# Patient Record
Sex: Male | Born: 1945 | Race: White | Hispanic: No | Marital: Married | Smoking: Never smoker
Health system: Southern US, Community
[De-identification: ages and names within clinical notes are randomized; demographics above are authoritative.]

## PROBLEM LIST (undated history)

## (undated) DIAGNOSIS — M436 Torticollis: Secondary | ICD-10-CM

## (undated) DIAGNOSIS — I82409 Acute embolism and thrombosis of unspecified deep veins of unspecified lower extremity: Secondary | ICD-10-CM

## (undated) DIAGNOSIS — G894 Chronic pain syndrome: Secondary | ICD-10-CM

## (undated) DIAGNOSIS — F32A Depression, unspecified: Secondary | ICD-10-CM

## (undated) DIAGNOSIS — M1990 Unspecified osteoarthritis, unspecified site: Secondary | ICD-10-CM

## (undated) DIAGNOSIS — E785 Hyperlipidemia, unspecified: Secondary | ICD-10-CM

## (undated) DIAGNOSIS — Z974 Presence of external hearing-aid: Secondary | ICD-10-CM

## (undated) DIAGNOSIS — R51 Headache: Secondary | ICD-10-CM

## (undated) DIAGNOSIS — E119 Type 2 diabetes mellitus without complications: Secondary | ICD-10-CM

## (undated) DIAGNOSIS — K219 Gastro-esophageal reflux disease without esophagitis: Secondary | ICD-10-CM

## (undated) DIAGNOSIS — K279 Peptic ulcer, site unspecified, unspecified as acute or chronic, without hemorrhage or perforation: Secondary | ICD-10-CM

## (undated) DIAGNOSIS — F419 Anxiety disorder, unspecified: Secondary | ICD-10-CM

## (undated) DIAGNOSIS — K449 Diaphragmatic hernia without obstruction or gangrene: Secondary | ICD-10-CM

## (undated) DIAGNOSIS — R519 Headache, unspecified: Secondary | ICD-10-CM

## (undated) DIAGNOSIS — N200 Calculus of kidney: Secondary | ICD-10-CM

## (undated) DIAGNOSIS — N183 Chronic kidney disease, stage 3 unspecified: Secondary | ICD-10-CM

## (undated) DIAGNOSIS — G4730 Sleep apnea, unspecified: Secondary | ICD-10-CM

## (undated) DIAGNOSIS — I10 Essential (primary) hypertension: Secondary | ICD-10-CM

## (undated) DIAGNOSIS — F329 Major depressive disorder, single episode, unspecified: Secondary | ICD-10-CM

## (undated) HISTORY — PX: TOTAL KNEE ARTHROPLASTY: SHX125

## (undated) HISTORY — PX: URETERAL STENT PLACEMENT: SHX822

## (undated) HISTORY — DX: Hyperlipidemia, unspecified: E78.5

## (undated) HISTORY — PX: CYSTOSCOPY: SUR368

## (undated) HISTORY — PX: JOINT REPLACEMENT: SHX530

## (undated) HISTORY — PX: SHOULDER OPEN ROTATOR CUFF REPAIR: SHX2407

---

## 1999-03-21 HISTORY — PX: TRIGGER FINGER RELEASE: SHX641

## 2004-01-20 ENCOUNTER — Ambulatory Visit: Payer: Self-pay | Admitting: Physician Assistant

## 2004-02-10 ENCOUNTER — Other Ambulatory Visit: Payer: Self-pay

## 2004-02-16 ENCOUNTER — Ambulatory Visit: Payer: Self-pay | Admitting: Unknown Physician Specialty

## 2005-04-13 ENCOUNTER — Ambulatory Visit: Payer: Self-pay | Admitting: Unknown Physician Specialty

## 2005-05-23 ENCOUNTER — Ambulatory Visit: Payer: Self-pay | Admitting: Unknown Physician Specialty

## 2005-05-26 ENCOUNTER — Other Ambulatory Visit: Payer: Self-pay

## 2005-05-30 ENCOUNTER — Observation Stay: Payer: Self-pay | Admitting: Unknown Physician Specialty

## 2007-02-26 ENCOUNTER — Other Ambulatory Visit: Payer: Self-pay

## 2007-02-26 ENCOUNTER — Ambulatory Visit: Payer: Self-pay | Admitting: Unknown Physician Specialty

## 2007-03-04 ENCOUNTER — Inpatient Hospital Stay: Payer: Self-pay | Admitting: Unknown Physician Specialty

## 2007-03-21 HISTORY — PX: ANTERIOR CERVICAL DECOMP/DISCECTOMY FUSION: SHX1161

## 2008-01-28 ENCOUNTER — Ambulatory Visit: Payer: Self-pay | Admitting: Unknown Physician Specialty

## 2008-02-21 ENCOUNTER — Ambulatory Visit: Payer: Self-pay | Admitting: Unknown Physician Specialty

## 2008-02-26 ENCOUNTER — Ambulatory Visit: Payer: Self-pay | Admitting: Unknown Physician Specialty

## 2008-11-07 ENCOUNTER — Ambulatory Visit: Payer: Self-pay | Admitting: Unknown Physician Specialty

## 2010-01-18 ENCOUNTER — Ambulatory Visit: Payer: Self-pay | Admitting: Cardiology

## 2010-09-30 ENCOUNTER — Ambulatory Visit: Payer: Self-pay | Admitting: Unknown Physician Specialty

## 2010-10-14 ENCOUNTER — Ambulatory Visit: Payer: Self-pay | Admitting: Unknown Physician Specialty

## 2010-10-18 ENCOUNTER — Observation Stay: Payer: Self-pay | Admitting: Unknown Physician Specialty

## 2012-01-22 ENCOUNTER — Ambulatory Visit: Payer: Self-pay | Admitting: Otolaryngology

## 2012-01-22 LAB — CREATININE, SERUM: Creatinine: 1.54 mg/dL — ABNORMAL HIGH (ref 0.60–1.30)

## 2012-04-29 ENCOUNTER — Ambulatory Visit: Payer: Self-pay

## 2013-09-12 DIAGNOSIS — E1129 Type 2 diabetes mellitus with other diabetic kidney complication: Secondary | ICD-10-CM | POA: Insufficient documentation

## 2013-09-12 DIAGNOSIS — E785 Hyperlipidemia, unspecified: Secondary | ICD-10-CM | POA: Insufficient documentation

## 2013-09-12 DIAGNOSIS — G4730 Sleep apnea, unspecified: Secondary | ICD-10-CM | POA: Insufficient documentation

## 2013-09-12 DIAGNOSIS — I10 Essential (primary) hypertension: Secondary | ICD-10-CM | POA: Insufficient documentation

## 2013-09-12 DIAGNOSIS — E782 Mixed hyperlipidemia: Secondary | ICD-10-CM | POA: Insufficient documentation

## 2013-09-12 DIAGNOSIS — G43909 Migraine, unspecified, not intractable, without status migrainosus: Secondary | ICD-10-CM | POA: Insufficient documentation

## 2013-09-23 DIAGNOSIS — I2510 Atherosclerotic heart disease of native coronary artery without angina pectoris: Secondary | ICD-10-CM | POA: Insufficient documentation

## 2013-11-06 ENCOUNTER — Ambulatory Visit (INDEPENDENT_AMBULATORY_CARE_PROVIDER_SITE_OTHER): Payer: Medicare Other

## 2013-11-06 ENCOUNTER — Ambulatory Visit (INDEPENDENT_AMBULATORY_CARE_PROVIDER_SITE_OTHER): Payer: Medicare Other | Admitting: Podiatry

## 2013-11-06 ENCOUNTER — Encounter: Payer: Self-pay | Admitting: Podiatry

## 2013-11-06 VITALS — BP 123/74 | HR 51 | Resp 16 | Ht 72.0 in | Wt 235.0 lb

## 2013-11-06 DIAGNOSIS — B351 Tinea unguium: Secondary | ICD-10-CM

## 2013-11-06 DIAGNOSIS — M79676 Pain in unspecified toe(s): Secondary | ICD-10-CM

## 2013-11-06 DIAGNOSIS — E1149 Type 2 diabetes mellitus with other diabetic neurological complication: Secondary | ICD-10-CM

## 2013-11-06 DIAGNOSIS — M79609 Pain in unspecified limb: Secondary | ICD-10-CM

## 2013-11-06 DIAGNOSIS — M722 Plantar fascial fibromatosis: Secondary | ICD-10-CM

## 2013-11-06 MED ORDER — TRIAMCINOLONE ACETONIDE 10 MG/ML IJ SUSP
10.0000 mg | Freq: Once | INTRAMUSCULAR | Status: AC
  Administered 2013-11-06: 10 mg

## 2013-11-06 NOTE — Progress Notes (Signed)
Patient ID: Xavier Hebert, male   DOB: Feb 21, 1946, 68 y.o.   MRN: [id number]  68 year old male presents the office today with complaints of left heel pain as well as painful elongated nails. For the heel pain, he states that previously he had been treated for plantar fasciitis and a heel spur. He previously had a steroid injection into the area and states that the injection gave him relief. He  states that this pain in his heel today is similar to what it felt like before. Pain is worse after a period of rest and is intermittent. No recent treatment. No history of trauma. States his sugar runs from 94-110.  Objective: AAO x3, NAD. DP/PT pulses palpable 2/4 b/l. CRT < 3 sec. Mild decrease in protective sensation with a Semmes Weinstein monofilament. Nail exam reveals hypertrophic, elongated, brittle, yellow discoloration, pain x10. No hyperkerotic lesions. No open lesions. Pain on palpation to the left plantar medial tubercle of the calcaneus at the insertion of the plantar fascia. No pain along the course of the plantar fascia into the arch of the foot. Plantar fascia is intact. No pain with lateral compression of the calcaneus. Mild equinus.  Assessment: 68 year old male with left plantar fasciitis, symptomatic onychomycosis  Plan: At this visit x-rays were obtained which did not reveal any acute fractures. There is large calcaneal plantar spurs. The x-ray report for full details. Discussed both conservative and surgical intervention with the patient in detail including alternatives, risks, complications. At this time the patient is requesting a steroid injection into the left heel to help alleviate his symptoms. Under sterile skin preparation total of 2.5 cc of a mixture of Kenalog 10, 0.5% Marcaine plain, 2% lidocaine plain was infiltrated around the insertion of the plantar fascia the plantar medial aspect of the calcaneus. She tolerated the procedure well. Discussed stretching exercises. Limit activity  to the left foot and no strenuous activity to allow the inflammation to resolve.   Nail sharply debrided T70 without complications.  Followup in one month or sooner if any problems are to arise.

## 2013-11-06 NOTE — Patient Instructions (Signed)
Plantar Fasciitis (Heel Spur Syndrome) with Rehab The plantar fascia is a fibrous, ligament-like, soft-tissue structure that spans the bottom of the foot. Plantar fasciitis is a condition that causes pain in the foot due to inflammation of the tissue. SYMPTOMS   Pain and tenderness on the underneath side of the foot.  Pain that worsens with standing or walking. CAUSES  Plantar fasciitis is caused by irritation and injury to the plantar fascia on the underneath side of the foot. Common mechanisms of injury include:  Direct trauma to bottom of the foot.  Damage to a small nerve that runs under the foot where the main fascia attaches to the heel bone.  Stress placed on the plantar fascia due to bone spurs. RISK INCREASES WITH:   Activities that place stress on the plantar fascia (running, jumping, pivoting, or cutting).  Poor strength and flexibility.  Improperly fitted shoes.  Tight calf muscles.  Flat feet.  Failure to warm-up properly before activity.  Obesity. PREVENTION  Warm up and stretch properly before activity.  Allow for adequate recovery between workouts.  Maintain physical fitness:  Strength, flexibility, and endurance.  Cardiovascular fitness.  Maintain a health body weight.  Avoid stress on the plantar fascia.  Wear properly fitted shoes, including arch supports for individuals who have flat feet. PROGNOSIS  If treated properly, then the symptoms of plantar fasciitis usually resolve without surgery. However, occasionally surgery is necessary. RELATED COMPLICATIONS   Recurrent symptoms that may result in a chronic condition.  Problems of the lower back that are caused by compensating for the injury, such as limping.  Pain or weakness of the foot during push-off following surgery.  Chronic inflammation, scarring, and partial or complete fascia tear, occurring more often from repeated injections. TREATMENT  Treatment initially involves the use of  ice and medication to help reduce pain and inflammation. The use of strengthening and stretching exercises may help reduce pain with activity, especially stretches of the Achilles tendon. These exercises may be performed at home or with a therapist. Your caregiver may recommend that you use heel cups of arch supports to help reduce stress on the plantar fascia. Occasionally, corticosteroid injections are given to reduce inflammation. If symptoms persist for greater than 6 months despite non-surgical (conservative), then surgery may be recommended.  MEDICATION   If pain medication is necessary, then nonsteroidal anti-inflammatory medications, such as aspirin and ibuprofen, or other minor pain relievers, such as acetaminophen, are often recommended.  Do not take pain medication within 7 days before surgery.  Prescription pain relievers may be given if deemed necessary by your caregiver. Use only as directed and only as much as you need.  Corticosteroid injections may be given by your caregiver. These injections should be reserved for the most serious cases, because they may only be given a certain number of times. HEAT AND COLD  Cold treatment (icing) relieves pain and reduces inflammation. Cold treatment should be applied for 10 to 15 minutes every 2 to 3 hours for inflammation and pain and immediately after any activity that aggravates your symptoms. Use ice packs or massage the area with a piece of ice (ice massage).  Heat treatment may be used prior to performing the stretching and strengthening activities prescribed by your caregiver, physical therapist, or athletic trainer. Use a heat pack or soak the injury in warm water. SEEK IMMEDIATE MEDICAL CARE IF:  Treatment seems to offer no benefit, or the condition worsens.  Any medications produce adverse side effects. EXERCISES RANGE   OF MOTION (ROM) AND STRETCHING EXERCISES - Plantar Fasciitis (Heel Spur Syndrome) These exercises may help you  when beginning to rehabilitate your injury. Your symptoms may resolve with or without further involvement from your physician, physical therapist or athletic trainer. While completing these exercises, remember:   Restoring tissue flexibility helps normal motion to return to the joints. This allows healthier, less painful movement and activity.  An effective stretch should be held for at least 30 seconds.  A stretch should never be painful. You should only feel a gentle lengthening or release in the stretched tissue. RANGE OF MOTION - Toe Extension, Flexion  Sit with your right / left leg crossed over your opposite knee.  Grasp your toes and gently pull them back toward the top of your foot. You should feel a stretch on the bottom of your toes and/or foot.  Hold this stretch for __________ seconds.  Now, gently pull your toes toward the bottom of your foot. You should feel a stretch on the top of your toes and or foot.  Hold this stretch for __________ seconds. Repeat __________ times. Complete this stretch __________ times per day.  RANGE OF MOTION - Ankle Dorsiflexion, Active Assisted  Remove shoes and sit on a chair that is preferably not on a carpeted surface.  Place right / left foot under knee. Extend your opposite leg for support.  Keeping your heel down, slide your right / left foot back toward the chair until you feel a stretch at your ankle or calf. If you do not feel a stretch, slide your bottom forward to the edge of the chair, while still keeping your heel down.  Hold this stretch for __________ seconds. Repeat __________ times. Complete this stretch __________ times per day.  STRETCH - Gastroc, Standing  Place hands on wall.  Extend right / left leg, keeping the front knee somewhat bent.  Slightly point your toes inward on your back foot.  Keeping your right / left heel on the floor and your knee straight, shift your weight toward the wall, not allowing your back to  arch.  You should feel a gentle stretch in the right / left calf. Hold this position for __________ seconds. Repeat __________ times. Complete this stretch __________ times per day. STRETCH - Soleus, Standing  Place hands on wall.  Extend right / left leg, keeping the other knee somewhat bent.  Slightly point your toes inward on your back foot.  Keep your right / left heel on the floor, bend your back knee, and slightly shift your weight over the back leg so that you feel a gentle stretch deep in your back calf.  Hold this position for __________ seconds. Repeat __________ times. Complete this stretch __________ times per day. STRETCH - Gastrocsoleus, Standing  Note: This exercise can place a lot of stress on your foot and ankle. Please complete this exercise only if specifically instructed by your caregiver.   Place the ball of your right / left foot on a step, keeping your other foot firmly on the same step.  Hold on to the wall or a rail for balance.  Slowly lift your other foot, allowing your body weight to press your heel down over the edge of the step.  You should feel a stretch in your right / left calf.  Hold this position for __________ seconds.  Repeat this exercise with a slight bend in your right / left knee. Repeat __________ times. Complete this stretch __________ times per day.    STRENGTHENING EXERCISES - Plantar Fasciitis (Heel Spur Syndrome)  These exercises may help you when beginning to rehabilitate your injury. They may resolve your symptoms with or without further involvement from your physician, physical therapist or athletic trainer. While completing these exercises, remember:   Muscles can gain both the endurance and the strength needed for everyday activities through controlled exercises.  Complete these exercises as instructed by your physician, physical therapist or athletic trainer. Progress the resistance and repetitions only as guided. STRENGTH -  Towel Curls  Sit in a chair positioned on a non-carpeted surface.  Place your foot on a towel, keeping your heel on the floor.  Pull the towel toward your heel by only curling your toes. Keep your heel on the floor.  If instructed by your physician, physical therapist or athletic trainer, add ____________________ at the end of the towel. Repeat __________ times. Complete this exercise __________ times per day. STRENGTH - Ankle Inversion  Secure one end of a rubber exercise band/tubing to a fixed object (table, pole). Loop the other end around your foot just before your toes.  Place your fists between your knees. This will focus your strengthening at your ankle.  Slowly, pull your big toe up and in, making sure the band/tubing is positioned to resist the entire motion.  Hold this position for __________ seconds.  Have your muscles resist the band/tubing as it slowly pulls your foot back to the starting position. Repeat __________ times. Complete this exercises __________ times per day.  Document Released: 03/06/2005 Document Revised: 05/29/2011 Document Reviewed: 06/18/2008 [employer] Patient Information 2015 [employer]. This information is not intended to replace advice given to you by your health care provider. Make sure you discuss any questions you have with your health care provider.  

## 2013-12-04 ENCOUNTER — Ambulatory Visit (INDEPENDENT_AMBULATORY_CARE_PROVIDER_SITE_OTHER): Payer: Medicare Other

## 2013-12-04 ENCOUNTER — Ambulatory Visit (INDEPENDENT_AMBULATORY_CARE_PROVIDER_SITE_OTHER): Payer: Medicare Other | Admitting: Podiatry

## 2013-12-04 VITALS — BP 117/67 | HR 72 | Resp 16

## 2013-12-04 DIAGNOSIS — M722 Plantar fascial fibromatosis: Secondary | ICD-10-CM

## 2013-12-04 DIAGNOSIS — M19079 Primary osteoarthritis, unspecified ankle and foot: Secondary | ICD-10-CM

## 2013-12-04 DIAGNOSIS — M779 Enthesopathy, unspecified: Secondary | ICD-10-CM

## 2013-12-04 NOTE — Patient Instructions (Signed)
Stirrup Ankle Brace Stirrup ankle braces give support and help stabilize the ankle joint. They are rigid pieces of plastic or fiberglass that go up both sides of the lower leg with the bottom of the stirrup fitting comfortably under the bottom of the instep of the foot. It can be held on with Velcro straps or an elastic wrap. Stirrup ankle braces are used to support the ankle following mild or moderate sprains or strains, or fractures after cast removal.  They can be easily removed or adjusted if there is swelling. The rigid brace shells are designed to fit the ankle comfortably and provide the needed medial/lateral stabilization. This brace can be easily worn with most athletic shoes. The brace liner is usually made of a soft, comfortable gel-like material. This gel fits the ankle well without causing uncomfortable pressure points.  IMPORTANCE OF ANKLE BRACES:  The use of ankle bracing is effective in the prevention of ankle sprains.  In athletes, the use of ankle bracing will offer protection and prevent further sprains.  Research shows that a complete rehabilitation program needs to be included with external bracing. This includes range of motion and ankle strengthening exercises. Your caregivers will instruct you in this. If you were given the brace today for a new injury, use the following home care instructions as a guide. HOME CARE INSTRUCTIONS   Apply ice to the sore area for 15-20 minutes, 03-04 times per day while awake for the first 2 days. Put the ice in a plastic bag and place a towel between the bag of ice and your skin. Never place the ice pack directly on your skin. Be especially careful using ice on an elbow or knee or other bony area, such as your ankle, because icing for too long may damage the nerves which are close to the surface.  Keep your leg elevated when possible to lessen swelling.  Wear your splint until you are seen for a follow-up examination. Do not put weight on it.  Do not get it wet. You may take it off to take a shower or bath.  For Activity: Use crutches with non-weight bearing for 1 week. Then, you may walk on your ankle as instructed. Start gradually with weight bearing on the affected ankle.  Continue to use crutches or a cane until you can stand on your ankle without causing pain.  Wiggle your toes in the splint several times per day if you are able.  The splint is too tight if you have numbness, tingling, or if your foot becomes cold and blue. Adjust the straps or elastic bandage to make it comfortable.  Only take over-the-counter or prescription medicines for pain, discomfort, or fever as directed by your caregiver. SEEK IMMEDIATE MEDICAL CARE IF:   You have increased bruising, swelling or pain.  Your toes are blue or cold and loosening the brace or wrap does not help.  Your pain is not relieved with medicine. MAKE SURE YOU:   Understand these instructions.  Will watch your condition.  Will get help right away if you are not doing well or get worse. Document Released: 01/05/2004 Document Revised: 05/29/2011 Document Reviewed: 06/08/2008 [facility] Patient Information 2015 [place], [place]. This information is not intended to replace advice given to you by your health care provider. Make sure you discuss any questions you have with your health care provider.

## 2013-12-05 ENCOUNTER — Encounter: Payer: Self-pay | Admitting: Podiatry

## 2013-12-05 NOTE — Progress Notes (Signed)
Patient ID: Xavier Hebert, male   DOB: Nov 27, 1945, 68 y.o.   MRN: [id number]  Subjective: Xavier Hebert, 68 year old male, returns to the office today for follow up evaluation of left plantar fasciitis. States that since his last appointment, he has not had any pain to the left heel. He continues with stretching exercises. While he is here he is asking to look at the right ankle. 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 He has intermittent, dull pain to the right ankle. Denies any recent injury/trauma. This has been ongoing for greater than one year. No other complaints at this time.   Objective: AAO x3, NAD DP/PT pulses palpable b/l. CRT < 3sec.  Decreased protective sensation with Derrel Nip monofilament. Left foot: No tenderness to palpation of the plantar medial tubercle of the calcaneus around insertion the plantar fascia. No pain with lateral compression of the calcaneus or along the posterior aspect. The fascia intact. Right ankle: Mild tenderness over the posterior lateral aspect of the fibula. No pinpoint bony tenderness and no pain the vibratory sensation. No pain along the medial malleolus or along the syndesmosis. No proximal leg pain. Range of motion intact to the ankle and pain-free. MMT 5/5, ROM WNL Her leg pain, swelling, warmth.  Assessment: Xavier Hebert, 68 year old male, with resolved left plantar fasciitis; right ankle arthritis  Plan: -X-rays were obtained and reviewed of the right ankle with the patient. There is diffuse chronic changes to the fibula consistent with arthritic changes. -Alternatives, risks and complications of both conservative and surgical treatment were discussed at length with the patient. -For the right ankle dispensed an ankle stabilizer. If pain continues we'll consider MRI. -Continue stretching exercises and icing the left foot. -Follow up in 1 month, or sooner if any  problems arise or if there are any changes in symptoms, questions/concerns.

## 2014-01-01 ENCOUNTER — Ambulatory Visit (INDEPENDENT_AMBULATORY_CARE_PROVIDER_SITE_OTHER): Payer: Medicare Other | Admitting: Podiatry

## 2014-01-01 ENCOUNTER — Encounter: Payer: Self-pay | Admitting: Podiatry

## 2014-01-01 VITALS — BP 111/60 | HR 54 | Resp 16

## 2014-01-01 DIAGNOSIS — E1140 Type 2 diabetes mellitus with diabetic neuropathy, unspecified: Secondary | ICD-10-CM

## 2014-01-01 DIAGNOSIS — B351 Tinea unguium: Secondary | ICD-10-CM

## 2014-01-01 DIAGNOSIS — M129 Arthropathy, unspecified: Secondary | ICD-10-CM

## 2014-01-01 DIAGNOSIS — E1149 Type 2 diabetes mellitus with other diabetic neurological complication: Secondary | ICD-10-CM

## 2014-01-01 DIAGNOSIS — M722 Plantar fascial fibromatosis: Secondary | ICD-10-CM

## 2014-01-01 DIAGNOSIS — M19079 Primary osteoarthritis, unspecified ankle and foot: Secondary | ICD-10-CM

## 2014-01-01 NOTE — Progress Notes (Signed)
Patient ID: Xavier Hebert, male   DOB: [birth year], 68 y.o.   MRN: [id number]  Subjective: Xavier Hebert returns the office they for followup evaluation of left foot plantar fasciitis as well as right ankle arthritis. He states that he has no pain on the left heel. He states he gets intermittent pain to the right ankle. He was wearing an ankle brace for period time and has since has discontinued she's had a flareup of her sciatica and low back pain. He's had no recent pain to his right ankle without the brace. Patient also asking if his fourth and fifth digit nails on bilateral feet can be debrided as they have a sharp edge. Denies any specific injury or trauma to the area. No acute changes since last appointment. No other complaints at this time.  Objective: AAO x3, NAD DP/PT pulses palpable bilaterally, CRT less than 3 seconds Decreased protective sensation with Simms Weinstein monofilament On the left foot there is no tenderness to palpation on the plantar medial tubercle of the calcaneus at the insertion the plantar fascia. No pain with lateral compression of the calcaneus or along the posterior aspect. On the right lower extremity there is no tenderness to palpation over the lateral ankle ligaments, fibula, tibia for syndesmotic space. No pain with range of motion. MMT 5/5, ROM WNL No open lesions or pre-ulcerative lesions. No calf pain, swelling, warmth. Bilateral fourth and fifth digit nails elongated, discolored, brittle. Remaining nails also hypertrophic, dystrophic, brittle, yellow-brown discoloration. No swelling erythema or drainage from the nails.  Assessment: 68 year old male followup evaluation of left foot plantar fasciitis right ankle pain. Elongated fourth and fifth digit nails bilaterally.  Plan: -Conservative versus surgical treatment discussed including alternatives, risks, complications. -At this time the patient's left heel and right ankles a symptomatically. Continue stretching  exercises. Discussed that if he has symptoms with right ankle he can wear the brace intermittently to help. -Fourth and fifth digit nails bilaterally sharply debrided without complications to patient comfort. -Followup on a regular schedule for diabetic risk assessment. Followup in 2 months. In the meantime call the office with any questions, concerns, change in symptoms.

## 2014-03-26 ENCOUNTER — Ambulatory Visit (INDEPENDENT_AMBULATORY_CARE_PROVIDER_SITE_OTHER): Payer: Medicare Other | Admitting: Podiatry

## 2014-03-26 DIAGNOSIS — L97511 Non-pressure chronic ulcer of other part of right foot limited to breakdown of skin: Secondary | ICD-10-CM

## 2014-03-26 DIAGNOSIS — M79676 Pain in unspecified toe(s): Secondary | ICD-10-CM

## 2014-03-26 DIAGNOSIS — B351 Tinea unguium: Secondary | ICD-10-CM

## 2014-03-26 NOTE — Patient Instructions (Signed)

## 2014-03-28 NOTE — Progress Notes (Signed)
Patient ID: Xavier Hebert, male   DOB: 01-24-1946, 69 y.o.   MRN: [id number]  Subjective: 69 year old male presents to the office for painful, elongated, thick toenails. He states the nails are painful particularly with shoegear and pressure. He denies any redness or drainage from around the nail sites. He states he currently does not have any pain to his right ankle or left heel. No other areas of tenderness. Denies any recent injury trauma to bilateral lower extremity is. Denies any systemic complaints as fevers, chills, nausea, vomiting. No other complaints at this time in no acute changes since last appointment.  Objective: AAO 3, NAD DP/PT pulses palpable, CRT less than 3 seconds Protective sensation decreased with Simms Weinstein monofilament, decreased vibratory sensation. Achilles tendon reflex intact. Nails hypertrophic, dystrophic, elongated, brittle, discolored 10. No surrounding erythema or drainage from around the nail sites. Upon debridement of the right third digit toenail the nail split and upon debridement the nail is loose. The nail was debrided of all loose tissue and there was found to be a superficial underlying ulceration underlying the nail. There was a small amount of serous drainage. The wound was granular nature and superficial. There is no sign erythema, ascending cellulitis. No tenderness to palpation overlying the area. No areas of fluctuance or crepitus.  No areas of pinpoint bony tenderness or pain with vibratory sensation to bilateral lower extremities. There is no overlying edema, erythema, increase in warmth bilaterally. MMT 5/5, ROM WNL No other open lesions or pre-ulcerative lesions identified.  No pain with calf compression, swelling, warmth, erythema  Assessment: 69 year old male with pneumatic onychomycosis, superficial ulceration right third digit nail bed.  Plan : -Treatment options were discussed with the patient including alternatives, risks,  complications.  -Nail sharply debrided 10 without complications. Upon debridement of the right third nail to the underlying ulceration the nail was debrided of all loose tissue. Wound was cleaned and antibiotic ointment and a Band-Aid was applied to the area. Recommended the patient to continue daily dressing changes and monitor the area for any clinical signs or symptoms of infection. If any are to occur directed to the emergency room or call the office.  -Follow-up in 2 weeks, or sooner should any problems arise. Directed to call sooner if there is any increase or change in symptoms.Call with any questions/concerns/change in symptoms.

## 2014-04-08 ENCOUNTER — Ambulatory Visit: Payer: Self-pay | Admitting: Unknown Physician Specialty

## 2014-04-09 ENCOUNTER — Ambulatory Visit: Payer: Medicare Other | Admitting: Podiatry

## 2014-06-10 HISTORY — PX: SHOULDER ARTHROSCOPY W/ ROTATOR CUFF REPAIR: SHX2400

## 2014-09-30 ENCOUNTER — Other Ambulatory Visit: Payer: Self-pay | Admitting: Unknown Physician Specialty

## 2014-09-30 DIAGNOSIS — M25511 Pain in right shoulder: Secondary | ICD-10-CM

## 2014-10-05 ENCOUNTER — Ambulatory Visit
Admission: RE | Admit: 2014-10-05 | Discharge: 2014-10-05 | Disposition: A | Discharge status: Home / Self Care | Payer: Medicare Other | Source: Ambulatory Visit | Attending: Unknown Physician Specialty | Admitting: Unknown Physician Specialty

## 2014-10-05 DIAGNOSIS — M62511 Muscle wasting and atrophy, not elsewhere classified, right shoulder: Secondary | ICD-10-CM | POA: Diagnosis not present

## 2014-10-05 DIAGNOSIS — S46811A Strain of other muscles, fascia and tendons at shoulder and upper arm level, right arm, initial encounter: Secondary | ICD-10-CM | POA: Diagnosis not present

## 2014-10-05 DIAGNOSIS — Z9889 Other specified postprocedural states: Secondary | ICD-10-CM | POA: Diagnosis not present

## 2014-10-05 DIAGNOSIS — M6289 Other specified disorders of muscle: Secondary | ICD-10-CM | POA: Diagnosis not present

## 2014-10-05 DIAGNOSIS — M25511 Pain in right shoulder: Secondary | ICD-10-CM | POA: Diagnosis present

## 2014-11-10 ENCOUNTER — Other Ambulatory Visit (HOSPITAL_COMMUNITY): Payer: Self-pay | Admitting: Orthopedic Surgery

## 2014-11-12 ENCOUNTER — Other Ambulatory Visit: Payer: Self-pay | Admitting: Orthopedic Surgery

## 2014-11-12 DIAGNOSIS — M25511 Pain in right shoulder: Secondary | ICD-10-CM

## 2014-11-18 ENCOUNTER — Ambulatory Visit
Admission: RE | Admit: 2014-11-18 | Discharge: 2014-11-18 | Disposition: A | Discharge status: Home / Self Care | Payer: Medicare Other | Source: Ambulatory Visit | Attending: Orthopedic Surgery | Admitting: Orthopedic Surgery

## 2014-11-18 DIAGNOSIS — M25511 Pain in right shoulder: Secondary | ICD-10-CM

## 2014-11-24 ENCOUNTER — Encounter (HOSPITAL_COMMUNITY)
Admission: RE | Admit: 2014-11-24 | Discharge: 2014-11-24 | Disposition: A | Discharge status: Home / Self Care | Payer: Medicare Other | Source: Ambulatory Visit | Attending: Orthopedic Surgery | Admitting: Orthopedic Surgery

## 2014-11-24 ENCOUNTER — Encounter (HOSPITAL_COMMUNITY): Payer: Self-pay

## 2014-11-24 DIAGNOSIS — I10 Essential (primary) hypertension: Secondary | ICD-10-CM | POA: Insufficient documentation

## 2014-11-24 DIAGNOSIS — Z0183 Encounter for blood typing: Secondary | ICD-10-CM | POA: Diagnosis not present

## 2014-11-24 DIAGNOSIS — R001 Bradycardia, unspecified: Secondary | ICD-10-CM | POA: Diagnosis not present

## 2014-11-24 DIAGNOSIS — Z01812 Encounter for preprocedural laboratory examination: Secondary | ICD-10-CM | POA: Diagnosis not present

## 2014-11-24 DIAGNOSIS — E119 Type 2 diabetes mellitus without complications: Secondary | ICD-10-CM | POA: Insufficient documentation

## 2014-11-24 DIAGNOSIS — Z01818 Encounter for other preprocedural examination: Secondary | ICD-10-CM | POA: Diagnosis present

## 2014-11-24 DIAGNOSIS — M12811 Other specific arthropathies, not elsewhere classified, right shoulder: Secondary | ICD-10-CM | POA: Diagnosis not present

## 2014-11-24 DIAGNOSIS — M5134 Other intervertebral disc degeneration, thoracic region: Secondary | ICD-10-CM | POA: Diagnosis not present

## 2014-11-24 DIAGNOSIS — I4510 Unspecified right bundle-branch block: Secondary | ICD-10-CM | POA: Insufficient documentation

## 2014-11-24 HISTORY — DX: Chronic pain syndrome: G89.4

## 2014-11-24 HISTORY — DX: Anxiety disorder, unspecified: F41.9

## 2014-11-24 HISTORY — DX: Unspecified osteoarthritis, unspecified site: M19.90

## 2014-11-24 HISTORY — DX: Gastro-esophageal reflux disease without esophagitis: K21.9

## 2014-11-24 HISTORY — DX: Headache, unspecified: R51.9

## 2014-11-24 HISTORY — DX: Essential (primary) hypertension: I10

## 2014-11-24 HISTORY — DX: Headache: R51

## 2014-11-24 HISTORY — DX: Major depressive disorder, single episode, unspecified: F32.9

## 2014-11-24 HISTORY — DX: Depression, unspecified: F32.A

## 2014-11-24 HISTORY — DX: Type 2 diabetes mellitus without complications: E11.9

## 2014-11-24 HISTORY — DX: Peptic ulcer, site unspecified, unspecified as acute or chronic, without hemorrhage or perforation: K27.9

## 2014-11-24 LAB — URINALYSIS, ROUTINE W REFLEX MICROSCOPIC
Bilirubin Urine: NEGATIVE
Glucose, UA: 100 mg/dL — AB
Hgb urine dipstick: NEGATIVE
KETONES UR: NEGATIVE mg/dL
LEUKOCYTES UA: NEGATIVE
NITRITE: NEGATIVE
PH: 5 (ref 5.0–8.0)
Protein, ur: 30 mg/dL — AB
SPECIFIC GRAVITY, URINE: 1.022 (ref 1.005–1.030)
Urobilinogen, UA: 0.2 mg/dL (ref 0.0–1.0)

## 2014-11-24 LAB — CBC
HEMATOCRIT: 43.7 % (ref 39.0–52.0)
Hemoglobin: 14.9 g/dL (ref 13.0–17.0)
MCH: 32.2 pg (ref 26.0–34.0)
MCHC: 34.1 g/dL (ref 30.0–36.0)
MCV: 94.4 fL (ref 78.0–100.0)
PLATELETS: 129 10*3/uL — AB (ref 150–400)
RBC: 4.63 MIL/uL (ref 4.22–5.81)
RDW: 12.9 % (ref 11.5–15.5)
WBC: 6.3 10*3/uL (ref 4.0–10.5)

## 2014-11-24 LAB — TYPE AND SCREEN
ABO/RH(D): A POS
Antibody Screen: NEGATIVE

## 2014-11-24 LAB — URINE MICROSCOPIC-ADD ON

## 2014-11-24 LAB — BASIC METABOLIC PANEL
Anion gap: 9 (ref 5–15)
BUN: 21 mg/dL — AB (ref 6–20)
CHLORIDE: 97 mmol/L — AB (ref 101–111)
CO2: 31 mmol/L (ref 22–32)
CREATININE: 1.57 mg/dL — AB (ref 0.61–1.24)
Calcium: 8.7 mg/dL — ABNORMAL LOW (ref 8.9–10.3)
GFR calc Af Amer: 51 mL/min — ABNORMAL LOW (ref >60)
GFR calc non Af Amer: 44 mL/min — ABNORMAL LOW (ref >60)
GLUCOSE: 150 mg/dL — AB (ref 65–99)
POTASSIUM: 3.2 mmol/L — AB (ref 3.5–5.1)
Sodium: 137 mmol/L (ref 135–145)

## 2014-11-24 LAB — SURGICAL PCR SCREEN
MRSA, PCR: NEGATIVE
STAPHYLOCOCCUS AUREUS: NEGATIVE

## 2014-11-24 LAB — GLUCOSE, CAPILLARY: GLUCOSE-CAPILLARY: 161 mg/dL — AB (ref 65–99)

## 2014-11-24 NOTE — Pre-Procedure Instructions (Signed)
Xavier Hebert  11/24/2014       Your procedure is scheduled on Tuesday, September 13.  Report to [facility] Admitting at 5:30 A.M.                Your surgery is scheduled for 7:30   Call this number if you have problems the morning of surgery :[phone]   Remember:  Do not eat food or drink liquids after midnight Monday, September 12.  Take these medicines the morning of surgery with A SIP OF WATER : pantoprazole (PROTONIX), methocarbamol (ROBAXIN).                   May take oxyCODONE (ROXICODONE) if you tolerate it on an empty stomach.                    Stop taking celecoxib (CELEBREX), Multiple Vitamin (MULTIVITAMIN) today.                Do not take any Aspirin or Aspirin products, Aleve (Naproxen), Advil (Ibuprofen).                             Do not take oral diabetes medicines (pills) the morning of surgery.  THE NIGHT BEFORE SURGERY, ###   check with Dr Brynda Greathouse. [facility] per national standards take ask you to take  80% of prescribed dose.                       How to Manage Your Diabetes Before Surgery Why is it important to control my blood sugar before and after surgery?   Improving blood sugar levels before and after surgery helps healing and can limit problems.  A way of improving blood sugar control is eating a healthy diet by:  - Eating less sugar and carbohydrates  - Increasing activity/exercise  - Talk with your doctor about reaching your blood sugar goals  High blood sugars (greater than 180 mg/dL) can raise your risk of infections and slow down your recovery so you will need to focus on controlling your diabetes during the weeks before surgery.  Make sure that the doctor who takes care of your diabetes knows about your planned surgery including the date and location.  How do I manage my blood sugars before surgery?   Check your blood sugar at least 4 times a day, 2 days before surgery to make sure that they are not too high or low.    Check your blood sugar the morning of your surgery when you wake up and every 2 hours until you get to the Short-Stay unit.  If your blood sugar is less than 70 mg/dL, you will need to treat for low blood sugar by:  Treat a low blood sugar (less than 70 mg/dL) with 1/2 cup of clear juice (cranberry or apple), 4 glucose tablets, OR glucose gel.  Recheck blood sugar in 15 minutes after treatment (to make sure it is greater than 70 mg/dL).  If blood sugar is not greater than 70 mg/dL on re-check, call [phone] for further instructions.   Report your blood sugar to the Short-Stay nurse when you get to Short-Stay.   Do not wear jewelry, make-up or nail polish.  Do not wear lotions, powders, or perfumes.     Men may shave face and neck.  Do not bring valuables to the hospital.  [facility] is  not responsible for any belongings or valuables.  Contacts, dentures or bridgework may not be worn into surgery.  Leave your suitcase in the car.  After surgery it may be brought to your room.  For patients admitted to the hospital, discharge time will be determined by your treatment team.  Patients discharged the day of surgery will not be allowed to drive home.   Name and phone number of your driver:  -  Special instructions: Review  [place] - Preparing For Surgery.  Please read over the following fact sheets that you were given. Pain Booklet, Coughing and Deep Breathing, Blood Transfusion Information and Surgical Site Infection Prevention

## 2014-11-24 NOTE — Progress Notes (Signed)
Mr Pett 's PCP is Dr Nicky Pugh in [place], [place].  Dr Brynda Greathouse manages patients diabetes.  Patient reports that he has lost 50 lbs since his wife died in Apr 12, 2022 of this year.  Patient states that he had been taking 50 units of Insulin, now he takes 20 units of Lantus if CBG is greater than 150.Patient reports that CBG runs around 120- 160.  Patient tried to get an appointment with Dr Brynda Greathouse prior to surgery but  Dr is out of town.

## 2014-11-25 LAB — HEMOGLOBIN A1C
Hgb A1c MFr Bld: 8.6 % — ABNORMAL HIGH (ref 4.8–5.6)
MEAN PLASMA GLUCOSE: 200 mg/dL

## 2014-11-25 LAB — ABO/RH: ABO/RH(D): A POS

## 2014-12-01 ENCOUNTER — Inpatient Hospital Stay (HOSPITAL_COMMUNITY): Payer: Medicare Other | Admitting: Anesthesiology

## 2014-12-01 ENCOUNTER — Inpatient Hospital Stay (HOSPITAL_COMMUNITY)
Admission: RE | Admit: 2014-12-01 | Discharge: 2014-12-03 | DRG: 483 | Disposition: A | Discharge status: Home / Self Care | Payer: Medicare Other | Source: Ambulatory Visit | Attending: Orthopedic Surgery | Admitting: Orthopedic Surgery

## 2014-12-01 ENCOUNTER — Inpatient Hospital Stay (HOSPITAL_COMMUNITY): Payer: Medicare Other

## 2014-12-01 ENCOUNTER — Encounter (HOSPITAL_COMMUNITY)
Admission: RE | Disposition: A | Discharge status: Home / Self Care | Payer: Self-pay | Source: Ambulatory Visit | Attending: Orthopedic Surgery

## 2014-12-01 ENCOUNTER — Encounter (HOSPITAL_COMMUNITY): Payer: Self-pay | Admitting: *Deleted

## 2014-12-01 DIAGNOSIS — M1990 Unspecified osteoarthritis, unspecified site: Secondary | ICD-10-CM | POA: Diagnosis present

## 2014-12-01 DIAGNOSIS — Z8711 Personal history of peptic ulcer disease: Secondary | ICD-10-CM

## 2014-12-01 DIAGNOSIS — K219 Gastro-esophageal reflux disease without esophagitis: Secondary | ICD-10-CM | POA: Diagnosis present

## 2014-12-01 DIAGNOSIS — F419 Anxiety disorder, unspecified: Secondary | ICD-10-CM | POA: Diagnosis present

## 2014-12-01 DIAGNOSIS — Z981 Arthrodesis status: Secondary | ICD-10-CM | POA: Diagnosis not present

## 2014-12-01 DIAGNOSIS — M129 Arthropathy, unspecified: Secondary | ICD-10-CM | POA: Diagnosis present

## 2014-12-01 DIAGNOSIS — M75101 Unspecified rotator cuff tear or rupture of right shoulder, not specified as traumatic: Secondary | ICD-10-CM | POA: Diagnosis present

## 2014-12-01 DIAGNOSIS — G43909 Migraine, unspecified, not intractable, without status migrainosus: Secondary | ICD-10-CM | POA: Diagnosis present

## 2014-12-01 DIAGNOSIS — Z96653 Presence of artificial knee joint, bilateral: Secondary | ICD-10-CM | POA: Diagnosis present

## 2014-12-01 DIAGNOSIS — G894 Chronic pain syndrome: Secondary | ICD-10-CM | POA: Diagnosis present

## 2014-12-01 DIAGNOSIS — F329 Major depressive disorder, single episode, unspecified: Secondary | ICD-10-CM | POA: Diagnosis present

## 2014-12-01 DIAGNOSIS — M25511 Pain in right shoulder: Secondary | ICD-10-CM | POA: Diagnosis present

## 2014-12-01 DIAGNOSIS — Z23 Encounter for immunization: Secondary | ICD-10-CM | POA: Diagnosis not present

## 2014-12-01 DIAGNOSIS — M19019 Primary osteoarthritis, unspecified shoulder: Secondary | ICD-10-CM | POA: Diagnosis present

## 2014-12-01 DIAGNOSIS — Z79899 Other long term (current) drug therapy: Secondary | ICD-10-CM | POA: Diagnosis not present

## 2014-12-01 DIAGNOSIS — E119 Type 2 diabetes mellitus without complications: Secondary | ICD-10-CM | POA: Diagnosis present

## 2014-12-01 DIAGNOSIS — I10 Essential (primary) hypertension: Secondary | ICD-10-CM | POA: Diagnosis present

## 2014-12-01 DIAGNOSIS — M12811 Other specific arthropathies, not elsewhere classified, right shoulder: Secondary | ICD-10-CM | POA: Diagnosis present

## 2014-12-01 HISTORY — PX: REVERSE SHOULDER ARTHROPLASTY: SHX5054

## 2014-12-01 HISTORY — DX: Calculus of kidney: N20.0

## 2014-12-01 LAB — GLUCOSE, CAPILLARY
GLUCOSE-CAPILLARY: 111 mg/dL — AB (ref 65–99)
GLUCOSE-CAPILLARY: 208 mg/dL — AB (ref 65–99)
Glucose-Capillary: 180 mg/dL — ABNORMAL HIGH (ref 65–99)

## 2014-12-01 LAB — CBC
HEMATOCRIT: 37.6 % — AB (ref 39.0–52.0)
Hemoglobin: 12.7 g/dL — ABNORMAL LOW (ref 13.0–17.0)
MCH: 31.8 pg (ref 26.0–34.0)
MCHC: 33.8 g/dL (ref 30.0–36.0)
MCV: 94 fL (ref 78.0–100.0)
Platelets: 106 10*3/uL — ABNORMAL LOW (ref 150–400)
RBC: 4 MIL/uL — AB (ref 4.22–5.81)
RDW: 13.3 % (ref 11.5–15.5)
WBC: 7.1 10*3/uL (ref 4.0–10.5)

## 2014-12-01 LAB — CREATININE, SERUM
Creatinine, Ser: 1.76 mg/dL — ABNORMAL HIGH (ref 0.61–1.24)
GFR calc non Af Amer: 38 mL/min — ABNORMAL LOW (ref >60)
GFR, EST AFRICAN AMERICAN: 44 mL/min — AB (ref >60)

## 2014-12-01 SURGERY — ARTHROPLASTY, SHOULDER, TOTAL, REVERSE
Anesthesia: General | Site: Shoulder | Laterality: Right

## 2014-12-01 MED ORDER — OXYCODONE HCL ER 15 MG PO T12A
15.0000 mg | EXTENDED_RELEASE_TABLET | Freq: Two times a day (BID) | ORAL | Status: DC
  Administered 2014-12-01 – 2014-12-03 (×4): 15 mg via ORAL
  Filled 2014-12-01 (×4): qty 1

## 2014-12-01 MED ORDER — ROCURONIUM BROMIDE 100 MG/10ML IV SOLN
INTRAVENOUS | Status: DC | PRN
  Administered 2014-12-01: 50 mg via INTRAVENOUS
  Administered 2014-12-01 (×2): 20 mg via INTRAVENOUS
  Administered 2014-12-01 (×2): 10 mg via INTRAVENOUS

## 2014-12-01 MED ORDER — EPHEDRINE SULFATE 50 MG/ML IJ SOLN
INTRAMUSCULAR | Status: DC | PRN
  Administered 2014-12-01 (×2): 5 mg via INTRAVENOUS
  Administered 2014-12-01: 10 mg via INTRAVENOUS
  Administered 2014-12-01 (×5): 5 mg via INTRAVENOUS

## 2014-12-01 MED ORDER — LINAGLIPTIN 5 MG PO TABS
5.0000 mg | ORAL_TABLET | Freq: Every day | ORAL | Status: DC
  Administered 2014-12-02 – 2014-12-03 (×2): 5 mg via ORAL
  Filled 2014-12-01 (×2): qty 1

## 2014-12-01 MED ORDER — OXYCODONE HCL 5 MG PO TABS
15.0000 mg | ORAL_TABLET | ORAL | Status: DC | PRN
  Administered 2014-12-01 – 2014-12-03 (×11): 15 mg via ORAL
  Filled 2014-12-01 (×10): qty 3

## 2014-12-01 MED ORDER — CHLORTHALIDONE 25 MG PO TABS
12.5000 mg | ORAL_TABLET | Freq: Every day | ORAL | Status: DC
  Administered 2014-12-02: 12.5 mg via ORAL
  Filled 2014-12-01 (×2): qty 0.5

## 2014-12-01 MED ORDER — HEPARIN SODIUM (PORCINE) 5000 UNIT/ML IJ SOLN
5000.0000 [IU] | Freq: Three times a day (TID) | INTRAMUSCULAR | Status: DC
  Administered 2014-12-01 – 2014-12-03 (×5): 5000 [IU] via SUBCUTANEOUS
  Filled 2014-12-01 (×5): qty 1

## 2014-12-01 MED ORDER — CEFAZOLIN SODIUM-DEXTROSE 2-3 GM-% IV SOLR
2.0000 g | Freq: Four times a day (QID) | INTRAVENOUS | Status: AC
Start: 2014-12-01 — End: 2014-12-02
  Administered 2014-12-01 – 2014-12-02 (×2): 2 g via INTRAVENOUS
  Filled 2014-12-01 (×2): qty 50

## 2014-12-01 MED ORDER — HYDROMORPHONE HCL 1 MG/ML IJ SOLN
0.2500 mg | INTRAMUSCULAR | Status: DC | PRN
  Administered 2014-12-01 (×2): 0.5 mg via INTRAVENOUS

## 2014-12-01 MED ORDER — ACETAMINOPHEN 325 MG PO TABS
650.0000 mg | ORAL_TABLET | Freq: Four times a day (QID) | ORAL | Status: DC | PRN
  Filled 2014-12-01: qty 2

## 2014-12-01 MED ORDER — ONDANSETRON HCL 4 MG/2ML IJ SOLN
INTRAMUSCULAR | Status: AC
  Filled 2014-12-01: qty 2

## 2014-12-01 MED ORDER — FENTANYL CITRATE (PF) 250 MCG/5ML IJ SOLN
INTRAMUSCULAR | Status: AC
  Filled 2014-12-01: qty 5

## 2014-12-01 MED ORDER — ONDANSETRON HCL 4 MG/2ML IJ SOLN: 4.0000 mg | Freq: Four times a day (QID) | INTRAMUSCULAR | Status: DC | PRN

## 2014-12-01 MED ORDER — CEFAZOLIN SODIUM-DEXTROSE 2-3 GM-% IV SOLR
INTRAVENOUS | Status: AC
  Filled 2014-12-01: qty 50

## 2014-12-01 MED ORDER — ALIGN PO CAPS: 1.0000 | ORAL_CAPSULE | Freq: Every day | ORAL | Status: DC

## 2014-12-01 MED ORDER — SODIUM CHLORIDE 0.9 % IR SOLN
Status: DC | PRN
  Administered 2014-12-01 (×7): 1000 mL

## 2014-12-01 MED ORDER — NEOSTIGMINE METHYLSULFATE 10 MG/10ML IV SOLN
INTRAVENOUS | Status: DC | PRN
  Administered 2014-12-01: 5 mg via INTRAVENOUS

## 2014-12-01 MED ORDER — SODIUM CHLORIDE 0.9 % IJ SOLN
INTRAMUSCULAR | Status: AC
  Filled 2014-12-01: qty 10

## 2014-12-01 MED ORDER — POTASSIUM CHLORIDE IN NACL 20-0.9 MEQ/L-% IV SOLN
INTRAVENOUS | Status: AC
  Administered 2014-12-01: 21:00:00 via INTRAVENOUS
  Filled 2014-12-01: qty 1000

## 2014-12-01 MED ORDER — PHENYLEPHRINE HCL 10 MG/ML IJ SOLN
INTRAMUSCULAR | Status: AC
  Filled 2014-12-01: qty 1

## 2014-12-01 MED ORDER — INSULIN ASPART 100 UNIT/ML ~~LOC~~ SOLN
0.0000 [IU] | Freq: Three times a day (TID) | SUBCUTANEOUS | Status: DC
  Administered 2014-12-02: 3 [IU] via SUBCUTANEOUS
  Administered 2014-12-02: 2 [IU] via SUBCUTANEOUS
  Administered 2014-12-02: 3 [IU] via SUBCUTANEOUS
  Administered 2014-12-03: 2 [IU] via SUBCUTANEOUS

## 2014-12-01 MED ORDER — HYDROPHILIC EX OINT: TOPICAL_OINTMENT | Freq: Two times a day (BID) | CUTANEOUS | Status: DC | PRN

## 2014-12-01 MED ORDER — PHENOL 1.4 % MT LIQD
1.0000 | OROMUCOSAL | Status: DC | PRN
Start: 2014-12-01 — End: 2014-12-03

## 2014-12-01 MED ORDER — GLYCOPYRROLATE 0.2 MG/ML IJ SOLN
INTRAMUSCULAR | Status: AC
  Filled 2014-12-01: qty 2

## 2014-12-01 MED ORDER — FENTANYL CITRATE (PF) 100 MCG/2ML IJ SOLN
INTRAMUSCULAR | Status: DC | PRN
  Administered 2014-12-01 (×3): 50 ug via INTRAVENOUS

## 2014-12-01 MED ORDER — METHOCARBAMOL 750 MG PO TABS
750.0000 mg | ORAL_TABLET | Freq: Four times a day (QID) | ORAL | Status: DC
  Administered 2014-12-01 – 2014-12-03 (×7): 750 mg via ORAL
  Filled 2014-12-01 (×6): qty 1

## 2014-12-01 MED ORDER — BUTALBITAL-APAP-CAFFEINE 50-325-40 MG PO TABS
2.0000 | ORAL_TABLET | Freq: Every day | ORAL | Status: DC | PRN
  Administered 2014-12-01: 2 via ORAL
  Filled 2014-12-01: qty 2

## 2014-12-01 MED ORDER — MIDAZOLAM HCL 5 MG/5ML IJ SOLN
INTRAMUSCULAR | Status: DC | PRN
  Administered 2014-12-01: 2 mg via INTRAVENOUS

## 2014-12-01 MED ORDER — PANTOPRAZOLE SODIUM 40 MG PO TBEC
80.0000 mg | DELAYED_RELEASE_TABLET | Freq: Two times a day (BID) | ORAL | Status: DC
  Administered 2014-12-01 – 2014-12-03 (×4): 80 mg via ORAL
  Filled 2014-12-01 (×4): qty 2

## 2014-12-01 MED ORDER — RISAQUAD PO CAPS
1.0000 | ORAL_CAPSULE | Freq: Every day | ORAL | Status: DC
  Administered 2014-12-02: 1 via ORAL
  Filled 2014-12-01 (×2): qty 1

## 2014-12-01 MED ORDER — ONDANSETRON HCL 4 MG PO TABS: 4.0000 mg | ORAL_TABLET | Freq: Four times a day (QID) | ORAL | Status: DC | PRN

## 2014-12-01 MED ORDER — MENTHOL 3 MG MT LOZG: 1.0000 | LOZENGE | OROMUCOSAL | Status: DC | PRN

## 2014-12-01 MED ORDER — CHLORHEXIDINE GLUCONATE 4 % EX LIQD: 60.0000 mL | Freq: Once | CUTANEOUS | Status: DC

## 2014-12-01 MED ORDER — EPHEDRINE SULFATE 50 MG/ML IJ SOLN
INTRAMUSCULAR | Status: AC
  Filled 2014-12-01: qty 1

## 2014-12-01 MED ORDER — BUPIVACAINE-EPINEPHRINE (PF) 0.5% -1:200000 IJ SOLN
INTRAMUSCULAR | Status: DC | PRN
  Administered 2014-12-01: 30 mL via PERINEURAL

## 2014-12-01 MED ORDER — CEFAZOLIN SODIUM-DEXTROSE 2-3 GM-% IV SOLR
2.0000 g | INTRAVENOUS | Status: AC
  Administered 2014-12-01: 2 g via INTRAVENOUS

## 2014-12-01 MED ORDER — ACETAMINOPHEN 650 MG RE SUPP: 650.0000 mg | Freq: Four times a day (QID) | RECTAL | Status: DC | PRN

## 2014-12-01 MED ORDER — ONDANSETRON HCL 4 MG/2ML IJ SOLN
INTRAMUSCULAR | Status: DC | PRN
  Administered 2014-12-01: 4 mg via INTRAVENOUS

## 2014-12-01 MED ORDER — ROSUVASTATIN CALCIUM 10 MG PO TABS
10.0000 mg | ORAL_TABLET | Freq: Every day | ORAL | Status: DC
  Administered 2014-12-01 – 2014-12-02 (×2): 10 mg via ORAL
  Filled 2014-12-01 (×2): qty 1

## 2014-12-01 MED ORDER — PROPOFOL 10 MG/ML IV BOLUS
INTRAVENOUS | Status: DC | PRN
  Administered 2014-12-01: 110 mg via INTRAVENOUS

## 2014-12-01 MED ORDER — MIDAZOLAM HCL 2 MG/2ML IJ SOLN
INTRAMUSCULAR | Status: AC
  Filled 2014-12-01: qty 4

## 2014-12-01 MED ORDER — GLYCOPYRROLATE 0.2 MG/ML IJ SOLN
INTRAMUSCULAR | Status: DC | PRN
  Administered 2014-12-01: .8 mg via INTRAVENOUS

## 2014-12-01 MED ORDER — PHENYLEPHRINE HCL 10 MG/ML IJ SOLN
10.0000 mg | INTRAMUSCULAR | Status: DC | PRN
  Administered 2014-12-01: 25 ug/min via INTRAVENOUS

## 2014-12-01 MED ORDER — LISINOPRIL 40 MG PO TABS
40.0000 mg | ORAL_TABLET | Freq: Every day | ORAL | Status: DC
  Administered 2014-12-02 – 2014-12-03 (×2): 40 mg via ORAL
  Filled 2014-12-01: qty 1
  Filled 2014-12-01: qty 2

## 2014-12-01 MED ORDER — INSULIN GLARGINE 100 UNIT/ML ~~LOC~~ SOLN
25.0000 [IU] | Freq: Every day | SUBCUTANEOUS | Status: DC
  Administered 2014-12-01 – 2014-12-02 (×2): 25 [IU] via SUBCUTANEOUS
  Filled 2014-12-01 (×3): qty 0.25

## 2014-12-01 MED ORDER — CELECOXIB 200 MG PO CAPS
ORAL_CAPSULE | ORAL | Status: AC
Start: 2014-12-01 — End: 2014-12-01
  Filled 2014-12-01: qty 1

## 2014-12-01 MED ORDER — CELECOXIB 200 MG PO CAPS
200.0000 mg | ORAL_CAPSULE | Freq: Two times a day (BID) | ORAL | Status: AC
  Administered 2014-12-01 – 2014-12-03 (×4): 200 mg via ORAL
  Filled 2014-12-01 (×3): qty 1

## 2014-12-01 MED ORDER — LIDOCAINE HCL (CARDIAC) 20 MG/ML IV SOLN
INTRAVENOUS | Status: AC
  Filled 2014-12-01: qty 5

## 2014-12-01 MED ORDER — OXYCODONE HCL 5 MG PO TABS
ORAL_TABLET | ORAL | Status: AC
Start: 2014-12-01 — End: 2014-12-02
  Filled 2014-12-01: qty 3

## 2014-12-01 MED ORDER — LACTATED RINGERS IV SOLN
INTRAVENOUS | Status: DC | PRN
  Administered 2014-12-01 (×2): via INTRAVENOUS

## 2014-12-01 MED ORDER — METHOCARBAMOL 500 MG PO TABS
ORAL_TABLET | ORAL | Status: AC
  Filled 2014-12-01: qty 2

## 2014-12-01 MED ORDER — ROCURONIUM BROMIDE 50 MG/5ML IV SOLN
INTRAVENOUS | Status: AC
  Filled 2014-12-01: qty 1

## 2014-12-01 MED ORDER — PROPOFOL 10 MG/ML IV BOLUS
INTRAVENOUS | Status: AC
  Filled 2014-12-01: qty 20

## 2014-12-01 MED ORDER — AQUAPHOR EX OINT
TOPICAL_OINTMENT | Freq: Two times a day (BID) | CUTANEOUS | Status: DC | PRN
  Filled 2014-12-01: qty 50

## 2014-12-01 MED ORDER — PREGNENOLONE POWD: 1.0000 | Freq: Every morning | Status: DC

## 2014-12-01 MED ORDER — ADULT MULTIVITAMIN W/MINERALS CH
1.0000 | ORAL_TABLET | Freq: Every day | ORAL | Status: DC
Start: 2014-12-02 — End: 2014-12-03
  Administered 2014-12-02 – 2014-12-03 (×2): 1 via ORAL
  Filled 2014-12-01 (×4): qty 1

## 2014-12-01 MED ORDER — HYDROMORPHONE HCL 1 MG/ML IJ SOLN
INTRAMUSCULAR | Status: AC
  Filled 2014-12-01: qty 1

## 2014-12-01 MED ORDER — FLUTICASONE PROPIONATE 50 MCG/ACT NA SUSP
1.0000 | Freq: Two times a day (BID) | NASAL | Status: DC
  Administered 2014-12-03: 1 via NASAL
  Filled 2014-12-01: qty 16

## 2014-12-01 MED ORDER — METOCLOPRAMIDE HCL 5 MG/ML IJ SOLN: 5.0000 mg | Freq: Three times a day (TID) | INTRAMUSCULAR | Status: DC | PRN

## 2014-12-01 MED ORDER — METOCLOPRAMIDE HCL 5 MG PO TABS: 5.0000 mg | ORAL_TABLET | Freq: Three times a day (TID) | ORAL | Status: DC | PRN

## 2014-12-01 SURGICAL SUPPLY — 65 items
BASEPLATE GLENOID MEDIUM (Plate) ×2 IMPLANT
BLADE SAW SGTL 13X75X1.27 (BLADE) ×2 IMPLANT
BUR MATCHSTICK NEURO 3.0 LAGG (BURR) ×2 IMPLANT
CLSR STERI-STRIP ANTIMIC 1/2X4 (GAUZE/BANDAGES/DRESSINGS) ×2 IMPLANT
COVER SURGICAL LIGHT HANDLE (MISCELLANEOUS) ×2 IMPLANT
COVER TABLE BACK 60X90 (DRAPES) IMPLANT
CUP REVS UNI 39+ 2MM RT OFFSET (Shoulder) ×2 IMPLANT
DRAPE C-ARM 42X72 X-RAY (DRAPES) IMPLANT
DRAPE IMP U-DRAPE 54X76 (DRAPES) ×2 IMPLANT
DRAPE INCISE IOBAN 66X45 STRL (DRAPES) ×2 IMPLANT
DRAPE U-SHAPE 47X51 STRL (DRAPES) ×4 IMPLANT
DRSG AQUACEL AG ADV 3.5X10 (GAUZE/BANDAGES/DRESSINGS) IMPLANT
DRSG MEPILEX BORDER 4X12 (GAUZE/BANDAGES/DRESSINGS) ×2 IMPLANT
DURAPREP 26ML APPLICATOR (WOUND CARE) ×2 IMPLANT
ELECT BLADE 6.5 EXT (BLADE) ×2 IMPLANT
ELECT REM PT RETURN 9FT ADLT (ELECTROSURGICAL) ×2
ELECTRODE REM PT RTRN 9FT ADLT (ELECTROSURGICAL) ×1 IMPLANT
GLENOID VIP GUIDE MODEL 3D (Shoulder) ×2 IMPLANT
GLENOSPHERE LAT 39+4 SHOULDER (Shoulder) ×2 IMPLANT
GLOVE BIOGEL PI IND STRL 7.5 (GLOVE) ×1 IMPLANT
GLOVE BIOGEL PI IND STRL 8 (GLOVE) ×1 IMPLANT
GLOVE BIOGEL PI INDICATOR 7.5 (GLOVE) ×1
GLOVE BIOGEL PI INDICATOR 8 (GLOVE) ×1
GLOVE ECLIPSE 7.0 STRL STRAW (GLOVE) ×2 IMPLANT
GLOVE SURG ORTHO 8.0 STRL STRW (GLOVE) ×2 IMPLANT
GOWN STRL REUS W/ TWL LRG LVL3 (GOWN DISPOSABLE) ×2 IMPLANT
GOWN STRL REUS W/ TWL XL LVL3 (GOWN DISPOSABLE) ×1 IMPLANT
GOWN STRL REUS W/TWL LRG LVL3 (GOWN DISPOSABLE) ×2
GOWN STRL REUS W/TWL XL LVL3 (GOWN DISPOSABLE) ×1
INSERT HUMERAL 39/+6 (Insert) ×2 IMPLANT
KIT BASIN OR (CUSTOM PROCEDURE TRAY) ×2 IMPLANT
KIT ROOM TURNOVER OR (KITS) ×2 IMPLANT
LOOP VESSEL MAXI BLUE (MISCELLANEOUS) ×2 IMPLANT
MANIFOLD NEPTUNE II (INSTRUMENTS) ×2 IMPLANT
NDL SUT 6 .5 CRC .975X.05 MAYO (NEEDLE) ×1 IMPLANT
NEEDLE HYPO 25GX1X1/2 BEV (NEEDLE) IMPLANT
NEEDLE MAYO TAPER (NEEDLE) ×1
NS IRRIG 1000ML POUR BTL (IV SOLUTION) ×10 IMPLANT
PACK SHOULDER (CUSTOM PROCEDURE TRAY) ×2 IMPLANT
PAD ARMBOARD 7.5X6 YLW CONV (MISCELLANEOUS) ×4 IMPLANT
PASSER SUT SWANSON 36MM LOOP (INSTRUMENTS) ×2 IMPLANT
SCREW CENTRAL NONLOCK 25MM (Screw) ×2 IMPLANT
SCREW LOCK PERIPHERAL 36MM (Screw) ×2 IMPLANT
SCREW LOCK PERIPHERAL 42MM (Screw) ×2 IMPLANT
SET PIN UNIVERSAL REVERSE (SET/KITS/TRAYS/PACK) ×2 IMPLANT
SLEEVE SURGEON STRL (DRAPES) ×2 IMPLANT
SPONGE LAP 18X18 X RAY DECT (DISPOSABLE) ×4 IMPLANT
STEM REVERS UNI SZ6 CAP COATED (Shoulder) ×2 IMPLANT
SUCTION FRAZIER TIP 10 FR DISP (SUCTIONS) ×2 IMPLANT
SUT FIBERWIRE #2 38 T-5 BLUE (SUTURE) ×10
SUT MAXBRAID (SUTURE) IMPLANT
SUT MNCRL AB 3-0 PS2 18 (SUTURE) ×2 IMPLANT
SUT VIC AB 0 CTB1 27 (SUTURE) ×4 IMPLANT
SUT VIC AB 1 CT1 27 (SUTURE) ×1
SUT VIC AB 1 CT1 27XBRD ANBCTR (SUTURE) ×1 IMPLANT
SUT VIC AB 2-0 CT1 27 (SUTURE) ×2
SUT VIC AB 2-0 CT1 TAPERPNT 27 (SUTURE) ×2 IMPLANT
SUT VICRYL 0 TIES 12 18 (SUTURE) ×2 IMPLANT
SUTURE FIBERWR #2 38 T-5 BLUE (SUTURE) ×5 IMPLANT
SYR CONTROL 10ML LL (SYRINGE) IMPLANT
TOWEL OR 17X24 6PK STRL BLUE (TOWEL DISPOSABLE) ×2 IMPLANT
TOWEL OR 17X26 10 PK STRL BLUE (TOWEL DISPOSABLE) ×2 IMPLANT
TRAY FOLEY BAG SILVER LF 16FR (CATHETERS) ×2 IMPLANT
VIP GUIDE MODEL ×2 IMPLANT
WATER STERILE IRR 1000ML POUR (IV SOLUTION) IMPLANT

## 2014-12-01 NOTE — Anesthesia Preprocedure Evaluation (Addendum)
Anesthesia Evaluation  Patient identified by MRN, date of birth, ID band Patient awake    Reviewed: Allergy & Precautions, H&P , NPO status , Patient's Chart, lab work & pertinent test results  Airway Mallampati: I  TM Distance: >3 FB Neck ROM: Full    Dental  (+) Partial Upper, Dental Advisory Given   Pulmonary sleep apnea and Continuous Positive Airway Pressure Ventilation ,    breath sounds clear to auscultation       Cardiovascular hypertension, Pt. on medications  Rhythm:Regular Rate:Normal     Neuro/Psych  Headaches, Anxiety Depression    GI/Hepatic PUD, GERD  Medicated and Controlled,  Endo/Other  diabetes, Type 2, Insulin Dependent, Oral Hypoglycemic Agents  Renal/GU      Musculoskeletal  (+) Arthritis ,   Abdominal   Peds  Hematology   Anesthesia Other Findings   Reproductive/Obstetrics                           Anesthesia Physical Anesthesia Plan  ASA: III  Anesthesia Plan: General and Regional   Post-op Pain Management: GA combined w/ Regional for post-op pain   Induction: Intravenous  Airway Management Planned: Oral ETT  Additional Equipment:   Intra-op Plan:   Post-operative Plan: Extubation in OR  Informed Consent: I have reviewed the patients History and Physical, chart, labs and discussed the procedure including the risks, benefits and alternatives for the proposed anesthesia with the patient or authorized representative who has indicated his/her understanding and acceptance.   Dental advisory given  Plan Discussed with: CRNA and Anesthesiologist  Anesthesia Plan Comments:        Anesthesia Quick Evaluation

## 2014-12-01 NOTE — Progress Notes (Signed)
Utilization review completed.  

## 2014-12-01 NOTE — Progress Notes (Signed)
PHARMACIST - PHYSICIAN ORDER COMMUNICATION  CONCERNING: P&T Medication Policy on Herbal Medications  DESCRIPTION:  This patient's order for:  Pregnenolone powder capsules  has been noted.  This product(s) is classified as an "herbal" or natural product. Due to a lack of definitive safety studies or FDA approval, nonstandard manufacturing practices, plus the potential risk of unknown drug-drug interactions while on inpatient medications, the Pharmacy and Therapeutics Committee does not permit the use of "herbal" or natural products of this type within [facility].   ACTION TAKEN: The pharmacy department is unable to verify this order at this time. Please reevaluate patient's clinical condition at discharge and address if the herbal or natural product(s) should be resumed at that time.   Kelvin Cellar, RPh Pager: [phone] 12/01/2014 9:06 PM

## 2014-12-01 NOTE — Transfer of Care (Signed)
Immediate Anesthesia Transfer of Care Note  Patient: Xavier Hebert  Procedure(s) Performed: Procedure(s): REVERSE SHOULDER ARTHROPLASTY (Right)  Patient Location: PACU  Anesthesia Type:GA combined with regional for post-op pain  Level of Consciousness: awake, alert  and oriented  Airway & Oxygen Therapy: Patient Spontanous Breathing and Patient connected to face mask oxygen  Post-op Assessment: Report given to RN and Post -op Vital signs reviewed and stable  Post vital signs: Reviewed and stable  Last Vitals:  Filed Vitals:   12/01/14 0625  BP: 111/74  Pulse: 74  Temp: 36.9 C  Resp: 20    Complications: No apparent anesthesia complications

## 2014-12-01 NOTE — Procedures (Signed)
Pt placed on hospital CPAP.  Pt is resting well and tolerating nicely.

## 2014-12-01 NOTE — H&P (Signed)
Xavier Hebert is an 69 y.o. male.   Chief Complaint: Right shoulder pain HPI: Xavier Hebert is a 69 year old patient with several month history of right shoulder pain. 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 He reports very significant functional limitations with range of motion of his shoulder which is preventing him from performing his activities of daily living he denies any fevers or chills. Patient does report being in chronic pain management.  Past Medical History  Diagnosis Date  . Hypertension   . Headache     Migraine- headache  . Diabetes mellitus without complication     Type II  . Anxiety   . Depression   . History of kidney stones   . GERD (gastroesophageal reflux disease)   . Arthritis   . Peptic ulcer   . Chronic pain syndrome     Past Surgical History  Procedure Laterality Date  . Cystoscopy      x3- 2 times for stone removal   . Ureteral stent placement    . Cervical fusion  2009  . Trigger finger release Right 2001  . Arthoscopic rotaor cuff repair Bilateral   . Joint replacement Bilateral     Knees, 2005 and 2008  . Cardiac catheterization      History reviewed. No pertinent family history. Social History:  reports that he has never smoked. He does not have any smokeless tobacco history on file. He reports that he does not drink alcohol or use illicit drugs.  Allergies:  Allergies  Allergen Reactions  . Aspirin Other (See Comments)    Stomach pain  . Ibuprofen Other (See Comments)    Stomach pain  . Ivp Dye [Iodinated Diagnostic Agents] Other (See Comments)    Breathing difficulty and chest pain  . Latex Itching    Medications Prior to Admission  Medication Sig Dispense Refill  . bifidobacterium infantis  (ALIGN) capsule Take 1 capsule by mouth daily.    . butalbital-acetaminophen-caffeine (FIORICET, ESGIC) 50-325-40 MG per tablet Take 2 tablets by mouth daily as needed for headache. Up to 2 tabs a day    . celecoxib (CELEBREX) 200 MG capsule Take 200 mg by mouth 2 (two) times daily.     . chlorthalidone (HYGROTON) 25 MG tablet Take 12.5 mg by mouth daily.    . fluticasone (FLONASE) 50 MCG/ACT nasal spray Place 1 spray into both nostrils 2 (two) times daily.    Marland Kitchen HYDROPHILIC EX Apply 1 application topically 2 (two) times daily as needed (skin irritation).    . insulin glargine (LANTUS) 100 UNIT/ML injection Inject 20-25 Units into the skin daily.     Marland Kitchen lisinopril (PRINIVIL,ZESTRIL) 40 MG tablet Take 40 mg by mouth daily.     . methocarbamol (ROBAXIN) 750 MG tablet Take 750 mg by mouth 4 (four) times daily.    . Multiple Vitamin (MULTIVITAMIN) tablet Take 1 tablet by mouth daily.    Marland Kitchen oxyCODONE (ROXICODONE) 15 MG immediate release tablet Take 15 mg by mouth 4 (four) times daily - after meals and at bedtime.    . pantoprazole (PROTONIX) 40 MG tablet Take 80 mg by mouth 2 (two) times daily.     . rosuvastatin (CRESTOR) 10 MG tablet TAKE ONE TABLET AT BEDTIME    . saxagliptin HCl (ONGLYZA) 5  MG TABS tablet Take 2.5 mg by mouth daily.     . Tapentadol HCl (NUCYNTA) 75 MG TABS Take 75 mg by mouth at bedtime.    . Coconut Oil 1000 MG CAPS Take 2,000 mg by mouth 2 (two) times daily.    . Glucosamine-Chondroitin (GLUCOSAMINE CHONDR COMPLEX PO) Take by mouth.    . Pregnenolone POWD Take 1 capsule by mouth.    . saw palmetto 500 MG capsule Take 1,000 mg by mouth 2 (two) times daily.      Results for orders placed or performed during the hospital encounter of 12/01/14 (from the past 48 hour(s))  Glucose, capillary     Status: Abnormal   Collection Time: 12/01/14  6:33 AM  Result Value Ref Range   Glucose-Capillary 111 (H) 65 - 99 mg/dL   No results found.  Review of Systems  Constitutional:  Negative.   HENT: Negative.   Eyes: Negative.   Respiratory: Negative.   Cardiovascular: Negative.   Gastrointestinal: Negative.   Genitourinary: Negative.   Musculoskeletal: Positive for joint pain.  Skin: Negative.   Neurological: Negative.   Endo/Heme/Allergies: Negative.   Psychiatric/Behavioral: Negative.     Blood pressure 111/74, pulse 74, temperature 98.5 F (36.9 C), temperature source Oral, resp. rate 20, weight 93.441 kg (206 lb), SpO2 95 %. Physical Exam  Constitutional: He appears well-developed.  HENT:  Head: Normocephalic.  Eyes: Pupils are equal, round, and reactive to light.  Neck: Normal range of motion.  Cardiovascular: Normal rate.   Respiratory: Effort normal.  Neurological: He is alert.  Skin: Skin is warm.  Psychiatric: He has a normal mood and affect.   right shoulder exam demonstrates significant weakness to infraspinatus supraspinatus muscle strength testing he has active forward flexion active abduction only to about 30. Motor sensory function to the hand is intact radial pulses intact skin around the shoulder girdle region is intact neck range of motion is full  Assessment/Plan Impression is right shoulder rotator cuff tear arthropathy with significant retracted tears of the anterior spinatus and supraspinatus with muscle atrophy. Patient's had a failed rotator cuff repair attempt earlier this year done in [place]. Plan is for reverse shoulder replacement. Risk and benefits discussed with the patient including but limited to infection nerve vessel damage glenoid loosening dislocation incomplete restoration of functional level that he desires. All questions answered. Patient is in chronic pain management and thus I would favor inner scalene block. We will continue him on his every 6 hour oxycodone and add long-acting oxycodone to that regimen for added pain relief. All questions answered  Josie Burleigh SCOTT 12/01/2014, 6:50 AM

## 2014-12-01 NOTE — Brief Op Note (Signed)
12/01/2014  12:07 PM  PATIENT:  Xavier Hebert  69 y.o. male  PRE-OPERATIVE DIAGNOSIS:  RIGHT SHOULDER ROTATOR CUFF ARTHROPATHY  POST-OPERATIVE DIAGNOSIS:  RIGHT SHOULDER ROTATOR CUFF ARTHROPATHY  PROCEDURE:  Procedure(s): REVERSE SHOULDER ARTHROPLASTY  SURGEON:  Surgeon(s): Meredith Pel, MD  ASSISTANT: carla bethune rnfa  ANESTHESIA:   general  EBL: 300 ml    Total I/O In: 1000 [I.V.:1000] Out: 800 [Urine:500; Blood:300]  BLOOD ADMINISTERED: none  DRAINS: none   LOCAL MEDICATIONS USED:  none  SPECIMEN:  No Specimen  COUNTS:  YES  TOURNIQUET:  * No tourniquets in log *  DICTATION: .Other Dictation: Dictation Number [id number]  PLAN OF CARE: admit  PATIENT DISPOSITION:  PACU - hemodynamically stable

## 2014-12-01 NOTE — Anesthesia Procedure Notes (Addendum)
Anesthesia Regional Block:  Interscalene brachial plexus block  Pre-Anesthetic Checklist: ,, timeout performed, Correct Patient, Correct Site, Correct Laterality, Correct Procedure, Correct Position, site marked, Risks and benefits discussed, pre-op evaluation,  At surgeon's request and post-op pain management  Laterality: Right  Prep: Maximum Sterile Barrier Precautions used and chloraprep       Needles:  Injection technique: Single-shot  Needle Type: Echogenic Stimulator Needle     Needle Length: 5cm 5 cm Needle Gauge: 22 and 22 G    Additional Needles:  Procedures: ultrasound guided (picture in chart) and nerve stimulator Interscalene brachial plexus block  Nerve Stimulator or Paresthesia:  Response: Biceps response,   Additional Responses:   Narrative:  Start time: 12/01/2014 7:15 AM End time: 12/01/2014 7:25 AM Injection made incrementally with aspirations every 5 mL. Anesthesiologist: Roderic Palau  Additional Notes: 2% Lidocaine skin wheel.    Procedure Name: Intubation Date/Time: 12/01/2014 7:36 AM Performed by: Kyung Rudd Pre-anesthesia Checklist: Patient identified, Emergency Drugs available, Suction available, Patient being monitored and Timeout performed Patient Re-evaluated:Patient Re-evaluated prior to inductionOxygen Delivery Method: Circle system utilized Preoxygenation: Pre-oxygenation with 100% oxygen Intubation Type: IV induction Ventilation: Mask ventilation without difficulty and Oral airway inserted - appropriate to patient size Laryngoscope Size: Mac and 4 Grade View: Grade I Tube type: Oral Tube size: 7.5 mm Number of attempts: 1 Airway Equipment and Method: Stylet Placement Confirmation: ETT inserted through vocal cords under direct vision,  positive ETCO2 and breath sounds checked- equal and bilateral Secured at: 22 cm Tube secured with: Tape Dental Injury: Teeth and Oropharynx as per pre-operative assessment

## 2014-12-01 NOTE — Anesthesia Postprocedure Evaluation (Signed)
  Anesthesia Post-op Note  Patient: Xavier Hebert  Procedure(s) Performed: Procedure(s): REVERSE SHOULDER ARTHROPLASTY (Right)  Patient Location: PACU  Anesthesia Type:GA combined with regional for post-op pain  Level of Consciousness: awake, alert  and oriented  Airway and Oxygen Therapy: Patient Spontanous Breathing  Post-op Pain: mild  Post-op Assessment: Post-op Vital signs reviewed, Patient's Cardiovascular Status Stable, Respiratory Function Stable, Patent Airway, No signs of Nausea or vomiting and Pain level controlled   LLE Sensation: Full sensation   RLE Sensation: Full sensation      Post-op Vital Signs: Reviewed and stable  Last Vitals:  Filed Vitals:   12/01/14 1545  BP: 100/61  Pulse:   Temp:   Resp:     Complications: No apparent anesthesia complications

## 2014-12-02 ENCOUNTER — Encounter (HOSPITAL_COMMUNITY): Payer: Self-pay | Admitting: General Practice

## 2014-12-02 LAB — GLUCOSE, CAPILLARY
GLUCOSE-CAPILLARY: 198 mg/dL — AB (ref 65–99)
GLUCOSE-CAPILLARY: 146 mg/dL — AB (ref 65–99)
Glucose-Capillary: 156 mg/dL — ABNORMAL HIGH (ref 65–99)
Glucose-Capillary: 152 mg/dL — ABNORMAL HIGH (ref 65–99)

## 2014-12-02 MED ORDER — PNEUMOCOCCAL VAC POLYVALENT 25 MCG/0.5ML IJ INJ
0.5000 mL | INJECTION | INTRAMUSCULAR | Status: AC
  Administered 2014-12-03: 0.5 mL via INTRAMUSCULAR
  Filled 2014-12-02: qty 0.5

## 2014-12-02 MED ORDER — INFLUENZA VAC SPLIT QUAD 0.5 ML IM SUSY
0.5000 mL | PREFILLED_SYRINGE | INTRAMUSCULAR | Status: AC
  Administered 2014-12-03: 0.5 mL via INTRAMUSCULAR
  Filled 2014-12-02: qty 0.5

## 2014-12-02 MED ORDER — MORPHINE SULFATE (PF) 4 MG/ML IV SOLN
4.0000 mg | INTRAVENOUS | Status: AC | PRN
  Administered 2014-12-02 (×2): 4 mg via INTRAVENOUS
  Filled 2014-12-02 (×2): qty 1

## 2014-12-02 MED ORDER — GLUCERNA SHAKE PO LIQD
237.0000 mL | Freq: Three times a day (TID) | ORAL | Status: DC
  Administered 2014-12-02 – 2014-12-03 (×2): 237 mL via ORAL

## 2014-12-02 NOTE — Plan of Care (Signed)
Problem: Consults Goal: Diagnosis - Shoulder Surgery Outcome: Completed/Met Date Met:  12/02/14 Reverse Total Shoulder Arthroplasty Right

## 2014-12-02 NOTE — Progress Notes (Signed)
Subjective: Pt stable - shoulder painful   Objective: Vital signs in last 24 hours: Temp:  [97.9 F (36.6 C)-98.8 F (37.1 C)] 98.5 F (36.9 C) (09/14 0535) Pulse Rate:  [70-112] 94 (09/14 0535) Resp:  [13-25] 18 (09/14 0535) BP: (86-123)/(52-87) 110/65 mmHg (09/14 0535) SpO2:  [94 %-100 %] 96 % (09/14 0535)  Intake/Output from previous day: 09/13 0701 - 09/14 0700 In: 1173 [I.V.:1173] Out: 1275 [Urine:975; Blood:300] Intake/Output this shift:    Exam:  Sensation intact distally Intact pulses distally  Labs:  Recent Labs  12/01/14 2050  HGB 12.7*    Recent Labs  12/01/14 2050  WBC 7.1  RBC 4.00*  HCT 37.6*  PLT 106*    Recent Labs  12/01/14 2050  CREATININE 1.76*   No results for input(s): LABPT, INR in the last 72 hours.  Assessment/Plan: Plan OT today - sats ok add mso4 iv for today only - dc am   DEAN,GREGORY SCOTT 12/02/2014, 7:07 AM

## 2014-12-02 NOTE — Care Management Note (Signed)
Case Management Note  Patient Details  Name: Xavier Hebert MRN: [id number] Date of Birth: 01-Jun-1945  Subjective/Objective:     69 y.o.M who underwent Right Reverse Total shoulder Arthroplasty 12/01/14. Experiencing pain control issues today requiring IV Morphine. OT evaluation complete. NO Home Follow up recommended.                Action/Plan:No CM needs at present. Anticipate Discharge 9/15.16.    Expected Discharge Date:                  Expected Discharge Plan:     In-House Referral:     Discharge planning Services     Post Acute Care Choice:    Choice offered to:     DME Arranged:    DME Agency:     HH Arranged:    [place] Agency:     Status of Service:     Medicare Important Message Given:    Date Medicare IM Given:    Medicare IM give by:    Date Additional Medicare IM Given:    Additional Medicare Important Message give by:     If discussed at [place] of Stay Meetings, dates discussed:    Additional Comments:  Delrae Sawyers, RN 12/02/2014, 10:55 AM

## 2014-12-02 NOTE — Op Note (Signed)
NAME:  Xavier Hebert, Xavier Hebert NO.:  [id number]  MEDICAL RECORD NO.:  [id number]  LOCATION:  [id number]                        FACILITY:  [facility]  PHYSICIAN:  Anderson Malta, M.D.    DATE OF BIRTH:  22-Jun-1945  DATE OF PROCEDURE: DATE OF DISCHARGE:                              OPERATIVE REPORT   PREOPERATIVE DIAGNOSIS:  Right shoulder rotator cuff arthropathy.  POSTOPERATIVE DIAGNOSIS:  Right shoulder rotator cuff arthropathy.  PROCEDURE:  Right shoulder reverse total shoulder replacement, Arthrex 39+ 6 cup, 39+ 4 glenosphere.  SURGEON:  Anderson Malta, MD  ASSISTANT:  Laure Kidney, RNFA.  INDICATIONS:  Xavier Hebert is a 69 year old patient with right arm weakness from rotator cuff arthropathy, who presents now for operative management after explanation of risks and benefits.  PROCEDURE IN DETAIL:  The patient was brought to operating room, where general endotracheal anesthesia was induced.  Preoperative antibiotics were administered.  Time-out was called.  The patient was placed in a beach-chair position with the head in neutral position.  Right arm, shoulder, and hand prescrubbed with alcohol and Betadine and allowed to air dry, prepped with DuraPrep solution, draped in sterile manner. Charlie Pitter was used to cover the operative field.  Incision was made just over the lateral 3rd of the coracoid process, beginning at the clavicle, extending down 2.3 cm below the axillary crease, 3 cm lateral to the axillary crease.  Skin and subcutaneous tissue were sharply divided. Deltopectoral approach was utilized.  Cephalic vein was mobilized medially, protected during the case.  At this time, rotator interval was entered.  Significant rotator cuff tear was observed.  Kolbel retractor was placed.  The three sisters artery and vein were suture ligated.  At this time, biceps tendon was tenodesed to the superior aspect of the PEG tendon.  The PEG tendon was released about 5-6 mm in order to  facilitate exposure.  Axillary nerve was then visualized, palpated and marked with a vessel loop and then protected at all times during the case.  At this time, subscap was detached from the lesser tuberosity and tagged with a #2 FiberWire sutures.  The capsule was released down to the 4 o'clock position on the humeral head.  The capsule was then released.  The labrum was excised.  Capsule was released under direct visualization. At this time, the head was dislocated, drill bit then placed at the superior aspect of the humeral head about a cm medial to the bicipital groove. Intramedullary lamina was used and the head was then cut at the junction of the anatomic neck.  This was cut in approximately 30 degrees of retroversion.  At this time, cap was placed on the humeral head.  The retractors were then placed, anterior retractor and Hohmann at the superior aspect of the glenoid.  Labrum and biceps tendon were excised. The capsule required further release posteriorly in order to facilitate exposure.  Guide pin was then placed in accordance with preoperative templating and in accordance with the guide.  Reaming was performed. Broaching was performed.  Glenosphere was then secured onto the glenoid in good position with excellent screw purchase obtained.  Bicortical and the superior and inferior screws, as well  as bicortical and the central screw.  Great compression was achieved.  The glenosphere placed. Attention directed towards the humerus.  Humerus was broached up to size 6.  The patient had a very hard bone in general.  At this time, with the broaching in place, the acetabular reaming was performed, then true prosthesis was placed.  Reduction was then performed with a +3, +6, and +9.  +9 was difficult and actually re-dislocated after it was reduced. +3 was too loose and then thus +6 was chosen.  Good stability was obtained with good soft tissue tension was obtained.  Axillary  nerve palpated, we felt like appropriate tension as well.  True spacer was then placed and excellent stability noted with full external rotation about 60 degrees, internal rotation to the waist without internal rotation parallel to the torso without instability and good forward flexion as well.  Thorough irrigation performed with 3-4 L of irrigating solution, the axillary nerve again palpated, and the vessel loop was released.  The deltopectoral split was closed using a 0 Vicryl suture followed by interrupted inverted 2-0 Vicryl suture and 3-0 Monocryl. Sling was applied.  The patient tolerated the procedure well without immediate complication, transferred to recovery room in stable condition.     Anderson Malta, M.D.     GSD/MEDQ  D:  12/01/2014  T:  12/02/2014  Job:  [id number]

## 2014-12-02 NOTE — Progress Notes (Signed)
Initial Nutrition Assessment  DOCUMENTATION CODES:   Not applicable  INTERVENTION:   Provide Glucerna Shake po TID, each supplement provides 220 kcal and 10 grams of protein.  Encourage adequate PO intake.  NUTRITION DIAGNOSIS:   Increased nutrient needs related to wound healing as evidenced by estimated needs.  GOAL:   Patient will meet greater than or equal to 90% of their needs  MONITOR:   PO intake, Supplement acceptance, Weight trends, Labs, I & O's  REASON FOR ASSESSMENT:   Malnutrition Screening Tool    ASSESSMENT:   69 year old patient with HTN, DM2, GERD presents with several month history of right shoulder pain. 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  PROCEDURE: (9/13): REVERSE SHOULDER ARTHROPLASTY  Meal completion has been 50%. Pt was unavailable during time of visit. Unable to obtain nutrition hx. Per Epic weight records, pt with a 10.8% weight loss in 2 months. RD to order nutritional supplements to aid in caloric and protein needs as well as in healing. Unable to complete Nutrition-Focused physical exam at this time.   Labs and medications reviewed.  Diet Order:  Diet Carb Modified Fluid consistency:: Thin; Room service appropriate?: Yes  Skin:   (Incision on R shoulder, non-pitting RUE edema)  Last BM:  9/12  Height:   Ht Readings from Last 1 Encounters:  11/24/14 6' (1.829 m)    Weight:   Wt Readings from Last 1 Encounters:  12/01/14 206 lb (93.441 kg)    Ideal Body Weight:  80.9 kg  BMI:  Body mass index is 27.93 kg/(m^2).  Estimated Nutritional Needs:   Kcal:  2200-2400  Protein:  115-130 grams  Fluid:  2.2 - 2.4 L/day  EDUCATION NEEDS:   No education needs identified at this  time  Corrin Parker, MS, RD, LDN Pager # [phone] After hours/ weekend pager # [phone]

## 2014-12-02 NOTE — Evaluation (Signed)
Occupational Therapy Evaluation Patient Details Name: Xavier Hebert MRN: [id number] DOB: Jul 30, 1945 Today's Date: 12/02/2014    History of Present Illness s/p R reverse total shoulder arthroplasty   Clinical Impression   Pt admitted with above diagnosis resulting in deficits listed below. Pt with increased pain during movement of R shoulder. Pt reports he was independent in ADLs and functional mobility PTA. Currently pt is supervision for bed mobility and functional transfers for safety. Pt also currently needs mod assist with LB ADLs, pt reports that he has AE that he can use at home for independence with LB ADLs. Education, demonstration, and handout was provided for shoulder precautions and exercises. Pt would benefit from continued skilled OT services for increased independence in ADLs, functional mobility, and safety for d/c home.     Follow Up Recommendations  No OT follow up;Supervision - Intermittent    Equipment Recommendations  None recommended by OT    Recommendations for Other Services       Precautions / Restrictions Precautions Precautions: Shoulder Type of Shoulder Precautions: active protocol Shoulder Interventions: Shoulder sling/immobilizer;For comfort (and sleep) Precaution Booklet Issued: Yes (comment) Required Braces or Orthoses: Sling Restrictions Weight Bearing Restrictions: Yes RUE Weight Bearing: Non weight bearing      Mobility Bed Mobility Overal bed mobility: Modified Independent             General bed mobility comments: pt has an adjustable bed at home, HOB elevated to 45 degrees  Transfers Overall transfer level: Needs assistance   Transfers: Sit to/from Stand Sit to Stand: Supervision              Balance Overall balance assessment: Needs assistance         Standing balance support: No upper extremity supported;During functional activity Standing balance-Leahy Scale: Good                              ADL  Overall ADL's : Needs assistance/impaired Eating/Feeding: Set up;Sitting   Grooming: Supervision/safety;Standing Grooming Details (indicate cue type and reason): Pt able to wash hands while standing at sink with S Upper Body Bathing: Supervision/ safety;Sitting   Lower Body Bathing: Moderate assistance;Sit to/from stand   Upper Body Dressing : Supervision/safety;Sitting   Lower Body Dressing: Sit to/from stand;Moderate assistance   Toilet Transfer: Supervision/safety;Ambulation Toilet Transfer Details (indicate cue type and reason): Pt was able to ambulate to bathroom without AD, S for safety.  Toileting- Clothing Manipulation and Hygiene: Supervision/safety;Sit to/from stand   Tub/ Shower Transfer: Supervision/safety;Ambulation   Functional mobility during ADLs: Supervision/safety       Vision     Perception     Praxis      Pertinent Vitals/Pain Pain Assessment: 0-10 Pain Score: 6  Pain Location: R shoulder Pain Descriptors / Indicators: Aching Pain Intervention(s): Premedicated before session;Limited activity within patient's tolerance;Monitored during session     Hand Dominance Right   Extremity/Trunk Assessment Upper Extremity Assessment Upper Extremity Assessment: RUE deficits/detail RUE Deficits / Details: No deficits R elbow, wrist, hand. Decreased ROM in R shoulder due to pain and immobilization RUE: Unable to fully assess due to pain;Unable to fully assess due to immobilization   Lower Extremity Assessment Lower Extremity Assessment: Overall WFL for tasks assessed   Cervical / Trunk Assessment Cervical / Trunk Assessment:  (hx of spinal stenosis)   Communication Communication Communication: No difficulties   Cognition Arousal/Alertness: Awake/alert Behavior During Therapy: WFL for tasks assessed/performed Overall Cognitive  Status: Within Functional Limits for tasks assessed                     General Comments       Exercises Exercises:  General Upper Extremity     Shoulder Instructions      Home Living Family/patient expects to be discharged to:: Private residence Living Arrangements: Alone (son is in [facility]) Available Help at Discharge: Family;Available PRN/intermittently Type of Home: House Home Access: Stairs to enter [employer] of Steps: 2 Entrance Stairs-Rails: Right;Left;Can reach both Home Layout: Two level;Able to live on main level with bedroom/bathroom     Bathroom Shower/Tub: Occupational psychologist: Standard     Home Equipment: Toilet riser;Walker - 4 wheels;Adaptive equipment;Shower seat;Bedside Art therapist: Sock aid (dressing stick)        Prior Functioning/Environment Level of Independence: Independent             OT Diagnosis: Generalized weakness;Acute pain   OT Problem List: Decreased strength;Decreased range of motion;Decreased activity tolerance;Decreased knowledge of use of DME or AE;Decreased knowledge of precautions;Pain   OT Treatment/Interventions: Self-care/ADL training;Therapeutic exercise;Energy conservation;DME and/or AE instruction    OT Goals(Current goals can be found in the care plan section) Acute Rehab OT Goals Patient Stated Goal: go home OT Goal Formulation: With patient Time For Goal Achievement: 12/16/14 Potential to Achieve Goals: Good ADL Goals Pt Will Perform Lower Body Bathing: with modified independence;with adaptive equipment;sit to/from stand Pt Will Perform Lower Body Dressing: with modified independence;with adaptive equipment;sit to/from stand Pt/caregiver will Perform Home Exercise Program: Increased ROM;Right Upper extremity;Independently;With written HEP provided  OT Frequency: Min 2X/week   Barriers to D/C: Decreased caregiver support  Pt lives alone. Pt reports that he has neighbors and a few family members that can check in on him but will be alone for the most part.        Co-evaluation               End of Session    Activity Tolerance: Patient limited by pain Patient left: in bed;with call bell/phone within reach   Time: 0835-0921 OT Time Calculation (min): 46 min Charges:  OT General Charges $OT Visit: 1 Procedure OT Evaluation $Initial OT Evaluation Tier I: 1 Procedure OT Treatments $Self Care/Home Management : 8-22 mins $Therapeutic Exercise: 8-22 mins G-Codes:    Binnie Kand M.S., OTR/L  Pager: [phone]  12/02/2014, 9:54 AM

## 2014-12-03 DIAGNOSIS — M12811 Other specific arthropathies, not elsewhere classified, right shoulder: Secondary | ICD-10-CM | POA: Diagnosis not present

## 2014-12-03 LAB — GLUCOSE, CAPILLARY: Glucose-Capillary: 148 mg/dL — ABNORMAL HIGH (ref 65–99)

## 2014-12-03 MED ORDER — OXYCODONE HCL ER 15 MG PO T12A: 15.0000 mg | EXTENDED_RELEASE_TABLET | Freq: Two times a day (BID) | ORAL | Status: DC

## 2014-12-03 MED ORDER — OXYCODONE HCL 15 MG PO TABS: 15.0000 mg | ORAL_TABLET | ORAL | Status: DC | PRN

## 2014-12-03 NOTE — Progress Notes (Signed)
Pt doing well - dc today

## 2014-12-03 NOTE — Progress Notes (Signed)
Patient resting Tolerated OT earlier today Plan for continued OT later today with possible discharge to home if stable and pain controlled

## 2014-12-03 NOTE — Procedures (Signed)
Pt is resting comfortably on room air and does not wish you use the CPAP this evening.

## 2014-12-03 NOTE — Progress Notes (Signed)
Occupational Therapy Treatment Patient Details Name: Xavier Hebert MRN: [id number] DOB: Aug 15, 1945 Today's Date: 12/03/2014    History of present illness s/p R reverse total shoulder arthroplasty   OT comments  Pt demonstrating good progress in OT. Pt able to recall shoulder exercises 2/3. Shoulder exercise handout given to pt. Pt demonstrated modified independence with toilet transfer, grooming, UB bathing, and sling management. Pt reports he is concerned his sister will not pick him up today for d/c due to her not visiting yesterday when she had planned to. Continue to follow pt acutely.    Follow Up Recommendations  No OT follow up;Supervision - Intermittent    Equipment Recommendations  None recommended by OT    Recommendations for Other Services      Precautions / Restrictions Precautions Precautions: Shoulder Type of Shoulder Precautions: active protocol (FF 90, ABD 60, ER 30) Shoulder Interventions: Shoulder sling/immobilizer;For comfort Required Braces or Orthoses: Sling Restrictions Weight Bearing Restrictions: Yes RUE Weight Bearing: Non weight bearing       Mobility Bed Mobility Overal bed mobility: Modified Independent             General bed mobility comments: pt has an adjustable bed at home, HOB elevated to 45 degrees  Transfers Overall transfer level: Modified independent Equipment used: None Transfers: Sit to/from Stand Sit to Stand: Modified independent (Device/Increase time)              Balance Overall balance assessment: Modified Independent                                 ADL Overall ADL's : Needs assistance/impaired     Grooming: Wash/dry hands;Modified independent;Standing           Upper Body Dressing : Modified independent;Sitting Upper Body Dressing Details (indicate cue type and reason): Pt understands and demonstrated UB dressing technique     Toilet Transfer: Modified Independent;Ambulation;Comfort  height toilet   Toileting- Clothing Manipulation and Hygiene: Modified independent;Sit to/from stand       Functional mobility during ADLs: Modified independent General ADL Comments: Educated pt on UB bathing technique, pt demonstrated understanding but did not want to practice      Vision                     Perception     Praxis      Cognition   Behavior During Therapy: [facility] for tasks assessed/performed Overall Cognitive Status: Within Functional Limits for tasks assessed                       Extremity/Trunk Assessment               Exercises General Exercises - Upper Extremity Shoulder Flexion: AAROM;Right;5 reps;Supine (0-45 degrees, EOB elevated) Shoulder Extension: AAROM;Right;5 reps;Supine (HOB elevated ) Shoulder ABduction: AAROM;Right;5 reps;Supine (0-45 degrees, HOB elevated) Shoulder ADduction: AAROM;Right;5 reps;Supine (HOB elevated ) Shoulder Horizontal ABduction: AAROM;Right;5 reps;Supine (pt able to get to neutral, HOB elevated ) Shoulder Horizontal ADduction: AAROM;Right;5 reps;Supine Elbow Flexion: AROM;Right;10 reps Elbow Extension: AROM;Right;10 reps Digit Composite Flexion: AROM;Right;10 reps Composite Extension: AROM;Right;10 reps   Shoulder Instructions       General Comments      Pertinent Vitals/ Pain       Pain Assessment: 0-10 Pain Score: 8  Pain Location: R shoulder following exercises  Pain Descriptors / Indicators: Aching;Sore Pain Intervention(s): Limited activity within patient's  tolerance;Monitored during session;RN gave pain meds during session;Repositioned;Ice applied  Home Living                                          Prior Functioning/Environment              Frequency Min 2X/week     Progress Toward Goals  OT Goals(current goals can now be found in the care plan section)  Progress towards OT goals: Progressing toward goals  Acute Rehab OT Goals Patient Stated Goal: go  home OT Goal Formulation: With patient Time For Goal Achievement: 12/16/14 Potential to Achieve Goals: Good  Plan Discharge plan remains appropriate    Co-evaluation                 End of Session Equipment Utilized During Treatment: Other (comment) (sling)   Activity Tolerance Patient tolerated treatment well   Patient Left in chair;with call bell/phone within reach   Nurse Communication          Time: 9628-3662 OT Time Calculation (min): 40 min  Charges: OT General Charges $OT Visit: 1 Procedure OT Treatments $Self Care/Home Management : 8-22 mins $Therapeutic Exercise: 8-22 mins  Binnie Kand M.S., OTR/L Pager: [phone]  12/03/2014, 10:51 AM

## 2014-12-07 NOTE — Discharge Summary (Signed)
Physician Discharge Summary  Patient ID: Xavier Hebert MRN: [id number] DOB/AGE: 1946-02-22 69 y.o.  Admit date: 12/01/2014 Discharge date: 12/03/2014  Admission Diagnoses:  Active Problems:   Arthritis of shoulder region, degenerative   Discharge Diagnoses:  Same  Surgeries: Procedure(s): REVERSE SHOULDER ARTHROPLASTY on 12/01/2014   Consultants:    Discharged Condition: Stable  Hospital Course: Xavier Hebert is an 69 y.o. male who was admitted 12/01/2014 with a chief complaint of right shoulder pain, and found to have a diagnosis of shoulder arthritis.  They were brought to the operating room on 12/01/2014 and underwent the above named procedures. Patient on preop pain meds and was kept two nights for  Pain control which was good at the time of dc  Antibiotics given:  Anti-infectives    Start     Dose/Rate Route Frequency Ordered Stop   12/01/14 2100  ceFAZolin (ANCEF) IVPB 2 g/50 mL premix     2 g 100 mL/hr over 30 Minutes Intravenous Every 6 hours 12/01/14 2021 12/02/14 0303   12/01/14 0628  ceFAZolin (ANCEF) 2-3 GM-% IVPB SOLR    Comments:  Henrine Screws   : cabinet override      12/01/14 0628 12/01/14 1844   12/01/14 0626  ceFAZolin (ANCEF) IVPB 2 g/50 mL premix     2 g 100 mL/hr over 30 Minutes Intravenous On call to O.R. 12/01/14 3903 12/01/14 0750    .  Recent vital signs:  Filed Vitals:   12/03/14 0620  BP: 117/65  Pulse: 81  Temp: 98.7 F (37.1 C)  Resp: 18    Recent laboratory studies:  Results for orders placed or performed during the hospital encounter of 12/01/14  Glucose, capillary  Result Value Ref Range   Glucose-Capillary 111 (H) 65 - 99 mg/dL  Glucose, capillary  Result Value Ref Range   Glucose-Capillary 180 (H) 65 - 99 mg/dL   Comment 1 Notify RN   CBC  Result Value Ref Range   WBC 7.1 4.0 - 10.5 K/uL   RBC 4.00 (L) 4.22 - 5.81 MIL/uL   Hemoglobin 12.7 (L) 13.0 - 17.0 g/dL   HCT 37.6 (L) 39.0 - 52.0 %   MCV 94.0 78.0 - 100.0 fL   MCH  31.8 26.0 - 34.0 pg   MCHC 33.8 30.0 - 36.0 g/dL   RDW 13.3 11.5 - 15.5 %   Platelets 106 (L) 150 - 400 K/uL  Creatinine, serum  Result Value Ref Range   Creatinine, Ser 1.76 (H) 0.61 - 1.24 mg/dL   GFR calc non Af Amer 38 (L) >60 mL/min   GFR calc Af Amer 44 (L) >60 mL/min  Glucose, capillary  Result Value Ref Range   Glucose-Capillary 208 (H) 65 - 99 mg/dL  Glucose, capillary  Result Value Ref Range   Glucose-Capillary 156 (H) 65 - 99 mg/dL  Glucose, capillary  Result Value Ref Range   Glucose-Capillary 198 (H) 65 - 99 mg/dL  Glucose, capillary  Result Value Ref Range   Glucose-Capillary 146 (H) 65 - 99 mg/dL  Glucose, capillary  Result Value Ref Range   Glucose-Capillary 152 (H) 65 - 99 mg/dL  Glucose, capillary  Result Value Ref Range   Glucose-Capillary 148 (H) 65 - 99 mg/dL    Discharge Medications:     Medication List    STOP taking these medications        GLUCOSAMINE CHONDR COMPLEX PO     NUCYNTA 75 MG Tabs  Generic drug:  Tapentadol HCl  Replaced by:  oxyCODONE 15 MG immediate release tablet     oxyCODONE 15 MG immediate release tablet  Commonly known as:  ROXICODONE  Replaced by:  OxyCODONE 15 mg T12a 12 hr tablet     saw palmetto 500 MG capsule      TAKE these medications        bifidobacterium infantis capsule  Take 1 capsule by mouth daily.     butalbital-acetaminophen-caffeine 50-325-40 MG per tablet  Commonly known as:  FIORICET, ESGIC  Take 2 tablets by mouth daily as needed for headache. Up to 2 tabs a day     celecoxib 200 MG capsule  Commonly known as:  CELEBREX  Take 200 mg by mouth 2 (two) times daily.     chlorthalidone 25 MG tablet  Commonly known as:  HYGROTON  Take 12.5 mg by mouth daily.     Coconut Oil 1000 MG Caps  Take 2,000 mg by mouth 2 (two) times daily.     CRESTOR 10 MG tablet  Generic drug:  rosuvastatin  TAKE ONE TABLET AT BEDTIME     fluticasone 50 MCG/ACT nasal spray  Commonly known as:  FLONASE  Place 1  spray into both nostrils 2 (two) times daily.     HYDROPHILIC EX  Apply 1 application topically 2 (two) times daily as needed (skin irritation).     insulin glargine 100 UNIT/ML injection  Commonly known as:  LANTUS  Inject 20-25 Units into the skin daily.     lisinopril 40 MG tablet  Commonly known as:  PRINIVIL,ZESTRIL  Take 40 mg by mouth daily.     methocarbamol 750 MG tablet  Commonly known as:  ROBAXIN  Take 750 mg by mouth 4 (four) times daily.     multivitamin tablet  Take 1 tablet by mouth daily.     OxyCODONE 15 mg T12a 12 hr tablet  Commonly known as:  OXYCONTIN  Take 1 tablet (15 mg total) by mouth every 12 (twelve) hours.     oxyCODONE 15 MG immediate release tablet  Commonly known as:  ROXICODONE  Take 1 tablet (15 mg total) by mouth every 4 (four) hours as needed for moderate pain.     pantoprazole 40 MG tablet  Commonly known as:  PROTONIX  Take 80 mg by mouth 2 (two) times daily.     Pregnenolone Powd  Take 1 capsule by mouth.     saxagliptin HCl 5 MG Tabs tablet  Commonly known as:  ONGLYZA  Take 2.5 mg by mouth daily.        Diagnostic Studies: Dg Chest 2 View  11/24/2014   CLINICAL DATA:  Preoperative exam prior to shoulder replacement surgery, history of diabetes and hypertension, nonsmoker.  EXAM: CHEST  2 VIEW  COMPARISON:  None impacts  FINDINGS: The lungs are adequately inflated and clear. The heart and pulmonary vascularity are normal. The mediastinum is normal in width. There is no pleural effusion. There is mild tortuosity of the descending thoracic aorta. There is mild multilevel degenerative disc disease of the thoracic spine. There is chronic widening of the right AC joint.  IMPRESSION: There is no active cardiopulmonary disease.   Electronically Signed   By: David  Martinique M.D.   On: 11/24/2014 16:04   Ct Shoulder Right Wo Contrast  11/18/2014   CLINICAL DATA:  Right shoulder pain for 5 years. Shoulder surgery June 10, 2014 for rotator cuff  tear repair. Preoperative assessment for orthotic.  EXAM: CT OF THE  RIGHT SHOULDER WITHOUT CONTRAST  TECHNIQUE: Multidetector CT imaging was performed according to the standard protocol. Multiplanar CT image reconstructions were also generated.  COMPARISON:  10/05/2014  FINDINGS: AC joint separated by 1.6 cm, likely postoperative. Shoulder joint effusion suspected and there is likely fluid in the subacromial subdeltoid bursa. Thinning of the space between the humeral head in the acromion may represent re-tear of the rotator cuff.  There appears to be chondrocalcinosis along the inferior 180 degrees of the glenoid labrum.  Moderate fatty atrophy of the infraspinatus muscle. Mild fatty atrophy of the supraspinatus muscle.  IMPRESSION: 1. Shoulder joint effusion and likely fluid in the subacromial subdeltoid bursa. 2. The considerable thinning of the space between the humeral head and the acromion favors underlying rotator cuff tear. This likely involves the infraspinatus and possibly the supraspinatus. 3. Chondrocalcinosis of the glenoid labrum. 4. Moderate fatty atrophy of the infraspinatus muscle with mild fatty atrophy of the supraspinatus.   Electronically Signed   By: Van Clines M.D.   On: 11/18/2014 15:41   Dg Shoulder Right Port  12/01/2014   CLINICAL DATA:  Patient status post right shoulder replacement.  EXAM: PORTABLE RIGHT SHOULDER - 2+ VIEW  COMPARISON:  Right shoulder CT 11/18/2014  FINDINGS: Patient status post right shoulder arthroplasty. Stable widening of the AC joint. Hardware appears in appropriate position. Visualized right hemi thorax is unremarkable.  IMPRESSION: No acute osseous abnormality status post right shoulder arthroplasty.   Electronically Signed   By: Lovey Newcomer M.D.   On: 12/01/2014 19:29    Disposition: 01-Home or Self Care      Discharge Instructions    Call MD / Call 911    Complete by:  As directed   If you experience chest pain or shortness of breath, CALL 911  and be transported to the hospital emergency room.  If you develope a fever above 101 F, pus (white drainage) or increased drainage or redness at the wound, or calf pain, call your surgeon's office.     Constipation Prevention    Complete by:  As directed   Drink plenty of fluids.  Prune juice may be helpful.  You may use a stool softener, such as Colace (over the counter) 100 mg twice a day.  Use MiraLax (over the counter) for constipation as needed.     Diet - low sodium heart healthy    Complete by:  As directed      Discharge instructions    Complete by:  As directed   Keep incision dry Use sling for comfort right arm Use CPM at least 4 hours per day for passive range of motion Follow-up in one week for clinical recheck     Increase activity slowly as tolerated    Complete by:  As directed               Signed: Cruzito Standre SCOTT 12/07/2014, 11:33 AM

## 2014-12-17 ENCOUNTER — Encounter (HOSPITAL_COMMUNITY): Payer: Self-pay | Admitting: Orthopedic Surgery

## 2015-02-25 ENCOUNTER — Ambulatory Visit: Payer: Medicare Other | Admitting: Podiatry

## 2015-03-09 ENCOUNTER — Ambulatory Visit (INDEPENDENT_AMBULATORY_CARE_PROVIDER_SITE_OTHER): Payer: Medicare Other | Admitting: Podiatry

## 2015-03-09 ENCOUNTER — Encounter: Payer: Self-pay | Admitting: Podiatry

## 2015-03-09 DIAGNOSIS — B351 Tinea unguium: Secondary | ICD-10-CM | POA: Diagnosis not present

## 2015-03-09 DIAGNOSIS — M79676 Pain in unspecified toe(s): Secondary | ICD-10-CM

## 2015-03-09 DIAGNOSIS — E1149 Type 2 diabetes mellitus with other diabetic neurological complication: Secondary | ICD-10-CM

## 2015-03-10 DIAGNOSIS — E119 Type 2 diabetes mellitus without complications: Secondary | ICD-10-CM | POA: Insufficient documentation

## 2015-03-10 NOTE — Progress Notes (Signed)
  Subjective: 69 y.o. returns the office today for painful, elongated, thickened toenails which he cannot trim himself. Denies any redness or drainage around the nails. He is getting diabetic shoes from the [place]. Denies any acute changes since last appointment and no new complaints today. Denies any systemic complaints such as fevers, chills, nausea, vomiting.   Objective: AAO 3, NAD DP/PT pulses palpable, CRT less than 3 seconds Protective sensation decreased with Simms Weinstein monofilament  Nails hypertrophic, dystrophic, elongated, brittle, discolored 10. There is tenderness overlying the nails 1-5 bilaterally. There is no surrounding erythema or drainage along the nail sites. No open lesions or pre-ulcerative lesions are identified. No other areas of tenderness bilateral lower extremities. No overlying edema, erythema, increased warmth. No pain with calf compression, swelling, warmth, erythema.  Assessment: Patient presents with symptomatic onychomycosis  Plan: -Treatment options including alternatives, risks, complications were discussed -Nails sharply debrided 10 without complication/bleeding. -Discussed daily foot inspection. If there are any changes, to call the office immediately.  -Follow-up in 3 months or sooner if any problems are to arise. In the meantime, encouraged to call the office with any questions, concerns, changes symptoms.  Celesta Gentile, DPM

## 2015-06-08 ENCOUNTER — Ambulatory Visit (INDEPENDENT_AMBULATORY_CARE_PROVIDER_SITE_OTHER): Payer: Medicare Other | Admitting: Podiatry

## 2015-06-08 ENCOUNTER — Encounter: Payer: Self-pay | Admitting: Podiatry

## 2015-06-08 DIAGNOSIS — B351 Tinea unguium: Secondary | ICD-10-CM

## 2015-06-08 DIAGNOSIS — E1149 Type 2 diabetes mellitus with other diabetic neurological complication: Secondary | ICD-10-CM | POA: Diagnosis not present

## 2015-06-08 DIAGNOSIS — M79676 Pain in unspecified toe(s): Secondary | ICD-10-CM

## 2015-06-08 NOTE — Progress Notes (Signed)
Patient ID: Xavier Hebert, male   DOB: 1945-07-07, 70 y.o.   MRN: [id number]  Subjective: 70 y.o. returns the office today for painful, elongated, thickened toenails which he cannot trim himself. Denies any redness or drainage around the nails. He states the neuropathy may be getting worse at night. Denies any acute changes since last appointment and no new complaints today. Denies any systemic complaints such as fevers, chills, nausea, vomiting.   Objective: AAO 3, NAD DP/PT pulses palpable, CRT less than 3 seconds Protective sensation decreased with Simms Weinstein monofilament  Nails hypertrophic, dystrophic, elongated, brittle, discolored 10. There is tenderness overlying the nails 1-5 bilaterally. There is no surrounding erythema or drainage along the nail sites. No open lesions or pre-ulcerative lesions are identified. No other areas of tenderness bilateral lower extremities. No overlying edema, erythema, increased warmth. No pain with calf compression, swelling, warmth, erythema.  Assessment: Patient presents with symptomatic onychomycosis; neuropathy  Plan: -Treatment options including alternatives, risks, complications were discussed -Nails sharply debrided 10 without complication/bleeding. -Discussed treatment options for neuropathy. At this time he elects to proceed with compound cream which is ordered today. -Discussed daily foot inspection. If there are any changes, to call the office immediately.  -Follow-up in 3 months or sooner if any problems are to arise. In the meantime, encouraged to call the office with any questions, concerns, changes symptoms.  Celesta Gentile, DPM

## 2015-08-19 ENCOUNTER — Ambulatory Visit (INDEPENDENT_AMBULATORY_CARE_PROVIDER_SITE_OTHER): Payer: Medicare Other

## 2015-08-19 ENCOUNTER — Encounter: Payer: Self-pay | Admitting: Podiatry

## 2015-08-19 ENCOUNTER — Ambulatory Visit (INDEPENDENT_AMBULATORY_CARE_PROVIDER_SITE_OTHER): Payer: Medicare Other | Admitting: Podiatry

## 2015-08-19 DIAGNOSIS — M19072 Primary osteoarthritis, left ankle and foot: Secondary | ICD-10-CM | POA: Diagnosis not present

## 2015-08-19 DIAGNOSIS — R52 Pain, unspecified: Secondary | ICD-10-CM | POA: Diagnosis not present

## 2015-08-19 DIAGNOSIS — M779 Enthesopathy, unspecified: Secondary | ICD-10-CM | POA: Diagnosis not present

## 2015-08-19 NOTE — Progress Notes (Signed)
   Subjective:    Patient ID: Xavier Hebert, male    DOB: 09-Jul-1945, 70 y.o.   MRN: [id number]  HPI  70 year old male presents the office today for concerns of left ankle and foot pain which is been ongoing the last 3 weeks was worsened over the last couple of days. He was previous and Celebrex for 10 years however he recently stopped this due to kidney problems. Since stopping this medicine he has noticed increased pain to his foot. He denies any recent injury or trauma. He's had some increase in swelling without any redness or warmth. He is able to ambulate in a regular shoe. No other complaints.  Review of Systems  All other systems reviewed and are negative.      Objective:   Physical Exam General: AAO x3, NAD  Dermatological: Skin is warm, dry and supple bilateral. There are no open lesions or pre-ulcerative lesions identified. There is no erythema or increase in warmth. There is mild edema to left ankle left foot.  Vascular: Dorsalis Pedis artery and Posterior Tibial artery pedal pulses are 2/4 bilateral with immedate capillary fill time. Pedal hair growth present. No varicosities and no lower extremity edema present bilateral. There is no pain with calf compression, swelling, warmth, erythema.   Neruologic: Sensation decreased with Derrel Nip monofilament  Musculoskeletal: Tenderness most notably along the ankle gutter of the left ankle along both medial and lateral ankle gutter. There is also some mild discomfort at the dorsal forefoot on the left side however there is no specific area pinpoint bony tenderness. He does have neuropathy which limits evaluation however. There is mild edema to the left ankle and foot without any erythema or increase in warmth.  Gait: Unassisted, Nonantalgic.       Assessment & Plan:  70 year old male with left ankle swelling, pain, likely osteoarthritis -Treatment options discussed including all alternatives, risks, and  complications -Etiology of symptoms were discussed -X-rays were obtained and reviewed with the patient. Arthritic changes are present the ankle. There is no evidence of acute fracture or stress fracture identified at this time. -This request a steroid injection to the left ankle. Under sterile conditions a mixture Kenalog and local anesthetic was infiltrated to the left ankle without complications. Post injection care was discussed. -Ankle brace dispensed the left ankle. -Follow-up as scheduled. In the meantime call any questions or concerns.  Celesta Gentile, DPM

## 2015-08-22 DIAGNOSIS — M779 Enthesopathy, unspecified: Secondary | ICD-10-CM | POA: Insufficient documentation

## 2015-08-22 DIAGNOSIS — M7750 Other enthesopathy of unspecified foot: Secondary | ICD-10-CM | POA: Insufficient documentation

## 2015-08-27 ENCOUNTER — Other Ambulatory Visit: Payer: Self-pay | Admitting: Orthopedic Surgery

## 2015-08-27 DIAGNOSIS — M542 Cervicalgia: Secondary | ICD-10-CM

## 2015-09-02 ENCOUNTER — Other Ambulatory Visit: Payer: Self-pay | Admitting: Orthopedic Surgery

## 2015-09-02 DIAGNOSIS — M545 Low back pain: Secondary | ICD-10-CM

## 2015-09-05 ENCOUNTER — Other Ambulatory Visit: Payer: Medicare Other

## 2015-09-07 ENCOUNTER — Ambulatory Visit: Payer: Medicare Other | Admitting: Podiatry

## 2015-09-09 ENCOUNTER — Ambulatory Visit
Admission: RE | Admit: 2015-09-09 | Discharge: 2015-09-09 | Disposition: A | Discharge status: Home / Self Care | Payer: Medicare Other | Source: Ambulatory Visit | Attending: Orthopedic Surgery | Admitting: Orthopedic Surgery

## 2015-09-09 ENCOUNTER — Ambulatory Visit (INDEPENDENT_AMBULATORY_CARE_PROVIDER_SITE_OTHER): Payer: Medicare Other | Admitting: Podiatry

## 2015-09-09 ENCOUNTER — Encounter: Payer: Self-pay | Admitting: Podiatry

## 2015-09-09 DIAGNOSIS — M19072 Primary osteoarthritis, left ankle and foot: Secondary | ICD-10-CM

## 2015-09-09 DIAGNOSIS — E1149 Type 2 diabetes mellitus with other diabetic neurological complication: Secondary | ICD-10-CM | POA: Diagnosis not present

## 2015-09-09 DIAGNOSIS — M542 Cervicalgia: Secondary | ICD-10-CM

## 2015-09-09 DIAGNOSIS — B351 Tinea unguium: Secondary | ICD-10-CM | POA: Diagnosis not present

## 2015-09-09 DIAGNOSIS — M79676 Pain in unspecified toe(s): Secondary | ICD-10-CM

## 2015-09-09 DIAGNOSIS — M545 Low back pain: Secondary | ICD-10-CM

## 2015-09-10 NOTE — Progress Notes (Signed)
Patient ID: Xavier Hebert, male   DOB: [birth year], 70 y.o.   MRN: [id number]  Subjective: 70 y.o. returns the office today for painful, elongated, thickened toenails which he cannot trim himself. Denies any redness or drainage around the nails.he states the injection to his ankle help significantly as pain has resolved. He was wearing the brace for several days afterwards and says the pain has stopped, he has discontinued wearing the brace. Denies any acute changes since last appointment and no new complaints today. Denies any systemic complaints such as fevers, chills, nausea, vomiting.   Objective: AAO 3, NAD DP/PT pulses palpable, CRT less than 3 seconds Protective sensation decreased with Simms Weinstein monofilament  Nails hypertrophic, dystrophic, elongated, brittle, discolored 10. There is tenderness overlying the nails 1-5 bilaterally. There is no surrounding erythema or drainage along the nail sites. No open lesions or pre-ulcerative lesions are identified. There is no pain to left ankle this time. No edema, erythema. No other areas of tenderness bilateral lower extremities. No overlying edema, erythema, increased warmth. No pain with calf compression, swelling, warmth, erythema.  Assessment: Patient presents with symptomatic onychomycosis; neuropathy; resolved ankle pain.  Plan: -Treatment options including alternatives, risks, complications were discussed -Nails sharply debrided 10 without complication/bleeding. -Compound cream as needed for neuropathy. -Ankle brace as needed for  -Discussed daily foot inspection. If there are any changes, to call the office immediately.  -Follow-up in 3 months or sooner if any problems are to arise. In the meantime, encouraged to call the office with any questions, concerns, changes symptoms.  Celesta Gentile, DPM

## 2015-12-09 ENCOUNTER — Encounter: Payer: Self-pay | Admitting: Podiatry

## 2015-12-09 ENCOUNTER — Ambulatory Visit (INDEPENDENT_AMBULATORY_CARE_PROVIDER_SITE_OTHER): Payer: Medicare Other | Admitting: Podiatry

## 2015-12-09 ENCOUNTER — Ambulatory Visit: Payer: Medicare Other

## 2015-12-09 DIAGNOSIS — R52 Pain, unspecified: Secondary | ICD-10-CM | POA: Diagnosis not present

## 2015-12-09 DIAGNOSIS — M79676 Pain in unspecified toe(s): Secondary | ICD-10-CM

## 2015-12-09 DIAGNOSIS — M19071 Primary osteoarthritis, right ankle and foot: Secondary | ICD-10-CM

## 2015-12-09 DIAGNOSIS — B351 Tinea unguium: Secondary | ICD-10-CM

## 2015-12-09 DIAGNOSIS — M779 Enthesopathy, unspecified: Secondary | ICD-10-CM | POA: Diagnosis not present

## 2015-12-09 DIAGNOSIS — E1149 Type 2 diabetes mellitus with other diabetic neurological complication: Secondary | ICD-10-CM

## 2015-12-13 NOTE — Progress Notes (Signed)
Subjective: 70 y.o. returns the office today for painful, elongated, thickened toenails which he cannot trim himself. Denies any redness or drainage around the nails. Denies any acute changes since last appointment and no new complaints today. He also states he started to get pain to his right ankle and he points in the center aspect of the ankle were used majority of pain. He denies any swelling or redness and denies any recent injury or trauma. This has been worsening over the last couple months since I saw him last. Since the injection the left ankle he's had no pain. Denies any systemic complaints such as fevers, chills, nausea, vomiting.   Objective: AAO 3, NAD DP/PT pulses palpable, CRT less than 3 seconds Protective sensation decreased with Simms Weinstein monofilament Nails hypertrophic, dystrophic, elongated, brittle, discolored 10. There is tenderness overlying the nails 1-5 bilaterally. There is no surrounding erythema or drainage along the nail sites. No open lesions or pre-ulcerative lesions are identified. There is tenderness palpation on the right ankle mostly on the anterior central aspect of the ankle joint. There is no pain or crepitation with ankle joint range of motion. No pain on the sinus tarsi or subtalar joint. There is no area pinpoint tenderness or pain the vibratory sedation. No other areas of tenderness bilateral lower extremities. No overlying edema, erythema, increased warmth. No pain with calf compression, swelling, warmth, erythema.  Assessment: Patient presents with symptomatic onychomycosis; osteoporosis right ankle  Plan: -Treatment options including alternatives, risks, complications were discussed -Nails sharply debrided 10 without complication/bleeding. -She did have reviewed of the right ankle. This mild osteophyte changes. No evidence of acute fracture. Discussed them treatment options. He is requesting steroid injection. Under sterile conditions a  mixture of Kenalog, Dexon of the local anesthetic was infiltrated right ankle without medications. Present injection care was discussed. Discussed shoe medications possible bracing. -Discussed daily foot inspection. If there are any changes, to call the office immediately.  -Follow-up in 3 months or sooner if any problems are to arise. In the meantime, encouraged to call the office with any questions, concerns, changes symptoms.  Celesta Gentile, DPM

## 2015-12-30 ENCOUNTER — Ambulatory Visit: Payer: Medicare Other | Admitting: Pain Medicine

## 2016-03-09 ENCOUNTER — Ambulatory Visit (INDEPENDENT_AMBULATORY_CARE_PROVIDER_SITE_OTHER): Payer: Medicare Other | Admitting: Podiatry

## 2016-03-09 ENCOUNTER — Ambulatory Visit (INDEPENDENT_AMBULATORY_CARE_PROVIDER_SITE_OTHER): Payer: Medicare Other

## 2016-03-09 ENCOUNTER — Ambulatory Visit: Payer: Medicare Other | Admitting: Podiatry

## 2016-03-09 DIAGNOSIS — B351 Tinea unguium: Secondary | ICD-10-CM | POA: Diagnosis not present

## 2016-03-09 DIAGNOSIS — M7750 Other enthesopathy of unspecified foot: Secondary | ICD-10-CM

## 2016-03-09 DIAGNOSIS — M79609 Pain in unspecified limb: Secondary | ICD-10-CM

## 2016-03-09 DIAGNOSIS — M25571 Pain in right ankle and joints of right foot: Secondary | ICD-10-CM | POA: Diagnosis not present

## 2016-03-09 DIAGNOSIS — L608 Other nail disorders: Secondary | ICD-10-CM

## 2016-03-09 DIAGNOSIS — M7752 Other enthesopathy of left foot: Secondary | ICD-10-CM

## 2016-03-09 DIAGNOSIS — E0843 Diabetes mellitus due to underlying condition with diabetic autonomic (poly)neuropathy: Secondary | ICD-10-CM | POA: Diagnosis not present

## 2016-03-09 DIAGNOSIS — M25572 Pain in left ankle and joints of left foot: Secondary | ICD-10-CM

## 2016-03-09 DIAGNOSIS — R52 Pain, unspecified: Secondary | ICD-10-CM | POA: Diagnosis not present

## 2016-03-09 DIAGNOSIS — L603 Nail dystrophy: Secondary | ICD-10-CM

## 2016-03-09 DIAGNOSIS — M659 Synovitis and tenosynovitis, unspecified: Secondary | ICD-10-CM | POA: Diagnosis not present

## 2016-03-09 DIAGNOSIS — M7751 Other enthesopathy of right foot: Secondary | ICD-10-CM | POA: Diagnosis not present

## 2016-03-10 MED ORDER — BETAMETHASONE SOD PHOS & ACET 6 (3-3) MG/ML IJ SUSP: 3.0000 mg | Freq: Once | INTRAMUSCULAR | Status: DC

## 2016-03-10 NOTE — Progress Notes (Signed)
SUBJECTIVE Patient with a history of diabetes mellitus presents to office today complaining of elongated, thickened nails. Pain while ambulating in shoes. Patient is unable to trim their own nails.  Patient also complains of pain and tenderness to the bilateral ankles. Patient states that he is on chronic pain management for multiple areas of his body. Patient states that he has chronic pain in his ankles bilaterally. Patient denies trauma. Patient states that the pain limits his ability to walk without pain.  Allergies  Allergen Reactions  . Aspirin Other (See Comments)    Stomach pain  . Ibuprofen Other (See Comments)    Stomach pain  . Ivp Dye [Iodinated Diagnostic Agents] Other (See Comments)    Breathing difficulty and chest pain  . Latex Itching    OBJECTIVE General Patient is awake, alert, and oriented x 3 and in no acute distress. Derm Skin is dry and supple bilateral. Negative open lesions or macerations. Remaining integument unremarkable. Nails are tender, long, thickened and dystrophic with subungual debris, consistent with onychomycosis, 1-5 bilateral. No signs of infection noted. Vasc  DP and PT pedal pulses palpable bilaterally. Temperature gradient within normal limits.  Neuro Epicritic and protective threshold sensation diminished bilaterally.  Musculoskeletal Exam Significant pain on palpation noted to the anterior medial and lateral aspects the patient's bilateral ankle joints area No symptomatic pedal deformities noted bilateral. Muscular strength within normal limits.  ASSESSMENT 1. Diabetes Mellitus w/ peripheral neuropathy 2. 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 Pain in foot bilateral 4. Pain in bilateral ankle joints. 5. Synovitis with capsulitis bilateral ankle joints  PLAN OF CARE 1. Patient evaluated today. 2. Instructed to maintain good pedal hygiene and foot care. Stressed importance of controlling blood sugar.  3. Mechanical debridement  of nails 1-5 bilaterally performed using a nail nipper. Filed with dremel without incident.  4. Injection of 0.5 mL Celestone Soluspan injected in the patient's bilateral ankle joints.  5. Discussed with the patient today conservative versus surgical management of bilateral ankle pain. Conservative management including intra-articular anti-inflammatory, oral anti-inflammatory, bracing, and shoe gear modification. Surgical management includes ankle arthroscopic synovectomy to the bilateral ankle joints.  6. The patient wants surgical management of bilateral ankle joint pain. Return to clinic in 2 weeks to further discuss possible ankle arthroscopic synovectomy.  Auth for arthroscopic ankle synovectomy next visit     Edrick Kins, DPM [facility]  Dr. Edrick Kins, [place]                                        [place], [place] [address]                Office [phone]  Fax [phone]

## 2016-03-28 ENCOUNTER — Encounter: Payer: Self-pay | Admitting: Podiatry

## 2016-03-28 ENCOUNTER — Ambulatory Visit (INDEPENDENT_AMBULATORY_CARE_PROVIDER_SITE_OTHER): Payer: Medicare Other | Admitting: Podiatry

## 2016-03-28 DIAGNOSIS — M659 Synovitis and tenosynovitis, unspecified: Secondary | ICD-10-CM

## 2016-03-28 DIAGNOSIS — G5790 Unspecified mononeuropathy of unspecified lower limb: Secondary | ICD-10-CM | POA: Diagnosis not present

## 2016-03-28 DIAGNOSIS — R2240 Localized swelling, mass and lump, unspecified lower limb: Secondary | ICD-10-CM

## 2016-03-28 DIAGNOSIS — M7751 Other enthesopathy of right foot: Secondary | ICD-10-CM

## 2016-03-28 DIAGNOSIS — M25571 Pain in right ankle and joints of right foot: Secondary | ICD-10-CM | POA: Diagnosis not present

## 2016-03-28 NOTE — Patient Instructions (Signed)
Pre-Operative Instructions  Congratulations, you have decided to take an important step to improving your quality of life.  You can be assured that the doctors of [facility] will be with you every step of the way.  1. Plan to be at the surgery center/hospital at least 1 (one) hour prior to your scheduled time unless otherwise directed by the surgical center/hospital staff.  You must have a responsible adult accompany you, remain during the surgery and drive you home.  Make sure you have directions to the surgical center/hospital and know how to get there on time. 2. For hospital based surgery you will need to obtain a history and physical form from your family physician within 1 month prior to the date of surgery- we will give you a form for you primary physician.  3. We make every effort to accommodate the date you request for surgery.  There are however, times where surgery dates or times have to be moved.  We will contact you as soon as possible if a change in schedule is required.   4. No Aspirin/Ibuprofen for one week before surgery.  If you are on aspirin, any non-steroidal anti-inflammatory medications (Mobic, Aleve, Ibuprofen) you should stop taking it 7 days prior to your surgery.  You make take Tylenol  For pain prior to surgery.  5. Medications- If you are taking daily heart and blood pressure medications, seizure, reflux, allergy, asthma, anxiety, pain or diabetes medications, make sure the surgery center/hospital is aware before the day of surgery so they may notify you which medications to take or avoid the day of surgery. 6. No food or drink after midnight the night before surgery unless directed otherwise by surgical center/hospital staff. 7. No alcoholic beverages 24 hours prior to surgery.  No smoking 24 hours prior to or 24 hours after surgery. 8. Wear loose pants or shorts- loose enough to fit over bandages, boots, and casts. 9. No slip on shoes, sneakers are best. 10. Bring  your boot with you to the surgery center/hospital.  Also bring crutches or a walker if your physician has prescribed it for you.  If you do not have this equipment, it will be provided for you after surgery. 11. If you have not been contracted by the surgery center/hospital by the day before your surgery, call to confirm the date and time of your surgery. 12. Leave-time from work may vary depending on the type of surgery you have.  Appropriate arrangements should be made prior to surgery with your employer. 13. Prescriptions will be provided immediately following surgery by your doctor.  Have these filled as soon as possible after surgery and take the medication as directed. 14. Remove nail polish on the operative foot. 15. Wash the night before surgery.  The night before surgery wash the foot and leg well with the antibacterial soap provided and water paying special attention to beneath the toenails and in between the toes.  Rinse thoroughly with water and dry well with a towel.  Perform this wash unless told not to do so by your physician.  Enclosed: 1 Ice pack (please put in freezer the night before surgery)   1 Hibiclens skin cleaner   Pre-op Instructions  If you have any questions regarding the instructions, do not hesitate to call our office.  [place]: [address] [place], [place] [address] [phone]  [place]: [address] [place], [place] [address] [phone]  [place]: [address]  [place], [place] [address] [phone]   Dr.   Norman Regal DPM, Dr. Matthew Wagoner DPM, Dr. M. Todd Hyatt DPM, Dr. Titorya Stover DPM 

## 2016-04-02 NOTE — Progress Notes (Signed)
Subjective:  Patient presents today for follow-up evaluation of bilateral ankle joint pain. Patient states that the injections did help for a few weeks however the injections slowly wore off. Patient presents today to discuss possible surgical intervention    Objective/Physical Exam General: The patient is alert and oriented x3 in no acute distress.  Dermatology: Skin is warm, dry and supple bilateral lower extremities. Negative for open lesions or macerations.  Vascular: Palpable pedal pulses bilaterally. No edema or erythema noted. Capillary refill within normal limits.  Neurological: Positive Tinel sign noted with percussion of the medial dorsal cutaneous nerve  Musculoskeletal Exam: Pain on palpation to the anterior medial and lateral aspects of the patient's bilateral ankle joints.   Assessment: #1 synovitis with capsulitis bilateral ankle joints. #2 pain in bilateral ankle joints more symptomatic on the right #3 neuritis #4 soft tissue mass left foot   Plan of Care:  #1 Patient was evaluated. #2 today we discussed conservative versus surgical management multiple symptoms including soft tissue mass to the left foot, neuritis with positive Tinel sign, and ankle joint synovitis to the right lower extremity. Patient consents for surgical correction today. Surgical correction will consist of neural lysis medial dorsal cutaneous nerve. Excision of soft tissue mass. Ankle arthroscopic synovectomy right. #3 authorization for surgery initiated today. All possible complications and details of the surgery were explained. All patient questions were answered. No guarantees were expressed or implied. #4 return to clinic 1 week postop   Edrick Kins, DPM [facility]  Dr. Edrick Kins, [place]                                        [place], [place] [address]                Office [phone]  Fax [phone]

## 2016-04-12 ENCOUNTER — Telehealth: Payer: Self-pay | Admitting: *Deleted

## 2016-04-12 NOTE — Telephone Encounter (Signed)
"  I am not going to be able to have surgery right now.  I will call back later to reschedule.  I have a problem right now.  I am allergic to [facility], it breaks me out into a rash.  My girlfriend accidentally washed my sheets and used Downy.  So, I have a rash that has broke out all over my legs.  The rash is fiery and itchy.  I am going to have to hold off on surgery until this has cleared up."  I will let Dr. Amalia Hailey know.  Feel better soon.

## 2016-06-15 ENCOUNTER — Ambulatory Visit: Payer: Medicare Other | Admitting: Podiatry

## 2016-06-19 ENCOUNTER — Encounter: Payer: Self-pay | Admitting: Podiatry

## 2016-06-19 ENCOUNTER — Ambulatory Visit (INDEPENDENT_AMBULATORY_CARE_PROVIDER_SITE_OTHER): Payer: Medicare Other | Admitting: Podiatry

## 2016-06-19 DIAGNOSIS — M79609 Pain in unspecified limb: Secondary | ICD-10-CM

## 2016-06-19 DIAGNOSIS — B351 Tinea unguium: Secondary | ICD-10-CM | POA: Diagnosis not present

## 2016-06-19 NOTE — Progress Notes (Signed)
Complaint:  Visit Type: Patient returns to my office for continued preventative foot care services. Complaint: Patient states" my nails have grown long and thick and become painful to walk and wear shoes" Patient has been diagnosed with DM with neuropathy.. The patient presents for preventative foot care services. No changes to ROS  Podiatric Exam: Vascular: dorsalis pedis and posterior tibial pulses are palpable bilateral. Capillary return is immediate. Temperature gradient is WNL. Skin turgor WNL  Sensory  Diminished Semmes Weinstein monofilament test. Normal tactile sensation bilaterally. Nail Exam: Pt has thick disfigured discolored nails with subungual debris noted bilateral entire nail hallux through fifth toenails Ulcer Exam: There is no evidence of ulcer or pre-ulcerative changes or infection. Orthopedic Exam: Muscle tone and strength are WNL. No limitations in general ROM. No crepitus or effusions noted. Foot type and digits show no abnormalities. Bony prominences are unremarkable. Skin: No Porokeratosis. No infection or ulcers  Diagnosis:  Onychomycosis, , Pain in right toe, pain in left toes  Treatment & Plan Procedures and Treatment: Consent by patient was obtained for treatment procedures. The patient understood the discussion of treatment and procedures well. All questions were answered thoroughly reviewed. Debridement of mycotic and hypertrophic toenails, 1 through 5 bilateral and clearing of subungual debris. No ulceration, no infection noted.  Return Visit-Office Procedure: Patient instructed to return to the office for a follow up visit 3 months for continued evaluation and treatment.    Gardiner Barefoot DPM

## 2016-09-08 ENCOUNTER — Ambulatory Visit (INDEPENDENT_AMBULATORY_CARE_PROVIDER_SITE_OTHER): Payer: Medicare Other | Admitting: Podiatry

## 2016-09-08 ENCOUNTER — Ambulatory Visit: Payer: Medicare Other | Admitting: Podiatry

## 2016-09-08 ENCOUNTER — Encounter: Payer: Self-pay | Admitting: Podiatry

## 2016-09-08 DIAGNOSIS — E1149 Type 2 diabetes mellitus with other diabetic neurological complication: Secondary | ICD-10-CM | POA: Diagnosis not present

## 2016-09-08 DIAGNOSIS — M7751 Other enthesopathy of right foot: Secondary | ICD-10-CM

## 2016-09-08 DIAGNOSIS — M79676 Pain in unspecified toe(s): Secondary | ICD-10-CM

## 2016-09-08 DIAGNOSIS — M659 Synovitis and tenosynovitis, unspecified: Secondary | ICD-10-CM

## 2016-09-08 DIAGNOSIS — B351 Tinea unguium: Secondary | ICD-10-CM

## 2016-09-08 DIAGNOSIS — E0842 Diabetes mellitus due to underlying condition with diabetic polyneuropathy: Secondary | ICD-10-CM

## 2016-09-08 DIAGNOSIS — M65971 Unspecified synovitis and tenosynovitis, right ankle and foot: Secondary | ICD-10-CM

## 2016-09-12 NOTE — Progress Notes (Signed)
   Subjective:  Patient presents today for dull, sharp, stabbing pain to the right great toe that isn't present for the past 4-5 days. He reports significant pain to the right great toe and is concerned for ingrown. Touching the area increases the pain. He is also requesting routine diabetic foot care and is unable to trim his own nails. He has been using lidocaine patches for treatment of the pain.   Objective / Physical Exam:  General:  The patient is alert and oriented x3 in no acute distress. Dermatology:  Skin is warm, dry and supple bilateral lower extremities. Negative for open lesions or macerations. Nails are tender, long, thickened and dystrophic with subungual debris, consistent with onychomycosis, 1-5 bilateral. No signs of infection noted. Vascular:  Palpable pedal pulses bilaterally. No edema or erythema noted. Capillary refill within normal limits. Neurological:  Epicritic and protective threshold grossly intact bilaterally.  Musculoskeletal Exam:  Pain on palpation to the anterior lateral medial aspects of the patient's right ankle. Mild edema noted. Pain with palpation to the right great toe.  Range of motion within normal limits to all pedal and ankle joints bilateral. Muscle strength 5/5 in all groups bilateral.      Assessment: #1 diabetic nail trim. #2 right ankle synovitis. 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 #2 injection of 0.5 mL Celestone Soluspan injected in the patient's right ankle. #3 Injection of 0.5 mLs Celestone Soluspan injected into the right great toe. #4 Mechanical debridement of nails 1-5 bilaterally performed using a nail nipper. Filed with dremel without incident.  #5 return to clinic in 4 weeks.   Edrick Kins, DPM [facility]  Dr. Edrick Kins, [place]                                        [place], [place] [address]                  Office [phone]  Fax [phone]

## 2016-09-14 MED ORDER — BETAMETHASONE SOD PHOS & ACET 6 (3-3) MG/ML IJ SUSP: 3.0000 mg | Freq: Once | INTRAMUSCULAR | Status: DC

## 2016-09-18 ENCOUNTER — Ambulatory Visit: Payer: Medicare Other | Admitting: Podiatry

## 2016-10-06 ENCOUNTER — Ambulatory Visit: Payer: Medicare Other | Admitting: Podiatry

## 2016-12-01 ENCOUNTER — Encounter: Payer: Self-pay | Admitting: Podiatry

## 2016-12-01 ENCOUNTER — Ambulatory Visit (INDEPENDENT_AMBULATORY_CARE_PROVIDER_SITE_OTHER): Payer: Medicare Other | Admitting: Podiatry

## 2016-12-01 DIAGNOSIS — B351 Tinea unguium: Secondary | ICD-10-CM | POA: Diagnosis not present

## 2016-12-01 DIAGNOSIS — E0842 Diabetes mellitus due to underlying condition with diabetic polyneuropathy: Secondary | ICD-10-CM

## 2016-12-01 DIAGNOSIS — M7671 Peroneal tendinitis, right leg: Secondary | ICD-10-CM | POA: Diagnosis not present

## 2016-12-01 DIAGNOSIS — M79676 Pain in unspecified toe(s): Secondary | ICD-10-CM

## 2016-12-04 NOTE — Progress Notes (Signed)
   SUBJECTIVE Patient with a history of diabetes mellitus presents to office today complaining of elongated, thickened nails. Pain while ambulating in shoes. Patient is unable to trim their own nails.  He also reports bilateral ankle pain for the past 2-3 weeks, right worse than left. He is requesting injections and states that is what helped alleviate his pain at the previous visit.   Past Medical History:  Diagnosis Date  . Anxiety   . Arthritis   . Chronic pain syndrome   . Depression   . Diabetes mellitus without complication ([place])    Type II  . GERD (gastroesophageal reflux disease)   . Headache    Migraine- headache  . Hypertension   . Kidney stones   . Peptic ulcer     OBJECTIVE General Patient is awake, alert, and oriented x 3 and in no acute distress. Derm Skin is dry and supple bilateral. Negative open lesions or macerations. Remaining integument unremarkable. Nails are tender, long, thickened and dystrophic with subungual debris, consistent with onychomycosis, 1-5 bilateral. No signs of infection noted. Vasc  DP and PT pedal pulses palpable bilaterally. Temperature gradient within normal limits.  Neuro Epicritic and protective threshold sensation diminished bilaterally.  Musculoskeletal Exam Pain on palpation to the right peroneal tendon. No symptomatic pedal deformities noted bilateral. Muscular strength within normal limits.  ASSESSMENT 1. Diabetes Mellitus w/ peripheral neuropathy 2. 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 Peroneal tendinitis right  PLAN OF CARE 1. Patient evaluated today. 2. Instructed to maintain good pedal hygiene and foot care. Stressed importance of controlling blood sugar.  3. Mechanical debridement of nails 1-5 bilaterally performed using a nail nipper. Filed with dremel without incident.  4. Injection of 0.5 mLs Celestone Soluspan injected into insertion of the peroneal tendon of the RLE. Care was taken to avoid direct  injection into the tendon. 5. Return to clinic in 3 mos.     Edrick Kins, DPM [facility]  Dr. Edrick Kins, [place]                                        [place], [place] [address]                Office [phone]  Fax [phone]

## 2016-12-08 MED ORDER — BETAMETHASONE SOD PHOS & ACET 6 (3-3) MG/ML IJ SUSP: 3.0000 mg | Freq: Once | INTRAMUSCULAR | Status: DC

## 2016-12-22 ENCOUNTER — Ambulatory Visit (INDEPENDENT_AMBULATORY_CARE_PROVIDER_SITE_OTHER): Payer: Medicare Other | Admitting: Podiatry

## 2016-12-22 ENCOUNTER — Encounter: Payer: Self-pay | Admitting: Podiatry

## 2016-12-22 ENCOUNTER — Ambulatory Visit (INDEPENDENT_AMBULATORY_CARE_PROVIDER_SITE_OTHER): Payer: Medicare Other

## 2016-12-22 DIAGNOSIS — M659 Synovitis and tenosynovitis, unspecified: Secondary | ICD-10-CM | POA: Diagnosis not present

## 2016-12-25 NOTE — Progress Notes (Signed)
   Subjective:  Patient presents today for follow up evaluation of intermittent pain and tenderness to the right ankle. He states the pain has improved and rates it at 4/10 at this time. He is requesting an injection. Patient relates significant pain and tenderness when walking. Patient presents for further treatment and evaluation.   Past Medical History:  Diagnosis Date  . Anxiety   . Arthritis   . Chronic pain syndrome   . Depression   . Diabetes mellitus without complication ([place])    Type II  . GERD (gastroesophageal reflux disease)   . Headache    Migraine- headache  . Hypertension   . Kidney stones   . Peptic ulcer      Objective / Physical Exam:  General:  The patient is alert and oriented x3 in no acute distress. Dermatology:  Skin is warm, dry and supple bilateral lower extremities. Negative for open lesions or macerations. Vascular:  Palpable pedal pulses bilaterally. No edema or erythema noted. Capillary refill within normal limits. Neurological:  Epicritic and protective threshold grossly intact bilaterally.  Musculoskeletal Exam:  Pain on palpation to the anterior lateral medial aspects of the patient's right ankle. Mild edema noted.  Range of motion within normal limits to all pedal and ankle joints bilateral. Muscle strength 5/5 in all groups bilateral.   Radiographic Exam:  Normal osseous mineralization. Joint spaces preserved. No fracture/dislocation/boney destruction.    Assessment: #1 pain in right ankle #2 synovitis of right ankle   Plan of Care:  #1 Patient was evaluated. X-rays reviewed. #2 injection of 0.5 mL Celestone Soluspan injected in the patient's right ankle. #3 compression anklet dispensed. #4 patient is to return to clinic when necessary.   Edrick Kins, DPM [facility]  Dr. Edrick Kins, [place]                                        [place], [place] [address]                Office [phone]   Fax [phone]

## 2017-01-19 ENCOUNTER — Encounter: Payer: Self-pay | Admitting: Podiatry

## 2017-01-19 ENCOUNTER — Ambulatory Visit (INDEPENDENT_AMBULATORY_CARE_PROVIDER_SITE_OTHER): Payer: Medicare Other | Admitting: Podiatry

## 2017-01-19 DIAGNOSIS — M722 Plantar fascial fibromatosis: Secondary | ICD-10-CM | POA: Diagnosis not present

## 2017-01-19 DIAGNOSIS — M659 Synovitis and tenosynovitis, unspecified: Secondary | ICD-10-CM

## 2017-01-20 ENCOUNTER — Emergency Department: Payer: Medicare Other

## 2017-01-20 ENCOUNTER — Emergency Department
Admission: EM | Admit: 2017-01-20 | Discharge: 2017-01-20 | Disposition: A | Discharge status: Home / Self Care | Payer: Medicare Other | Attending: Emergency Medicine | Admitting: Emergency Medicine

## 2017-01-20 ENCOUNTER — Encounter: Payer: Self-pay | Admitting: Emergency Medicine

## 2017-01-20 DIAGNOSIS — W0110XA Fall on same level from slipping, tripping and stumbling with subsequent striking against unspecified object, initial encounter: Secondary | ICD-10-CM | POA: Insufficient documentation

## 2017-01-20 DIAGNOSIS — S4991XA Unspecified injury of right shoulder and upper arm, initial encounter: Secondary | ICD-10-CM | POA: Diagnosis present

## 2017-01-20 DIAGNOSIS — Z79899 Other long term (current) drug therapy: Secondary | ICD-10-CM | POA: Insufficient documentation

## 2017-01-20 DIAGNOSIS — Z91040 Latex allergy status: Secondary | ICD-10-CM | POA: Diagnosis not present

## 2017-01-20 DIAGNOSIS — E119 Type 2 diabetes mellitus without complications: Secondary | ICD-10-CM | POA: Diagnosis not present

## 2017-01-20 DIAGNOSIS — S46011A Strain of muscle(s) and tendon(s) of the rotator cuff of right shoulder, initial encounter: Secondary | ICD-10-CM | POA: Insufficient documentation

## 2017-01-20 DIAGNOSIS — I10 Essential (primary) hypertension: Secondary | ICD-10-CM | POA: Insufficient documentation

## 2017-01-20 DIAGNOSIS — S46911A Strain of unspecified muscle, fascia and tendon at shoulder and upper arm level, right arm, initial encounter: Secondary | ICD-10-CM

## 2017-01-20 DIAGNOSIS — Y929 Unspecified place or not applicable: Secondary | ICD-10-CM | POA: Diagnosis not present

## 2017-01-20 DIAGNOSIS — Z96611 Presence of right artificial shoulder joint: Secondary | ICD-10-CM | POA: Diagnosis not present

## 2017-01-20 DIAGNOSIS — Y9301 Activity, walking, marching and hiking: Secondary | ICD-10-CM | POA: Insufficient documentation

## 2017-01-20 DIAGNOSIS — Y999 Unspecified external cause status: Secondary | ICD-10-CM | POA: Insufficient documentation

## 2017-01-20 DIAGNOSIS — M25511 Pain in right shoulder: Secondary | ICD-10-CM

## 2017-01-20 MED ORDER — DEXAMETHASONE SODIUM PHOSPHATE 10 MG/ML IJ SOLN
10.0000 mg | Freq: Once | INTRAMUSCULAR | Status: AC
  Administered 2017-01-20: 10 mg via INTRAMUSCULAR
  Filled 2017-01-20: qty 1

## 2017-01-20 MED ORDER — PREDNISONE 10 MG PO TABS: 10.0000 mg | ORAL_TABLET | Freq: Two times a day (BID) | ORAL | 0 refills | Status: AC

## 2017-01-20 NOTE — ED Notes (Signed)
Pt reports that he fell while moving things in his house - he grabbed the wall with his right hand and injured right shoulder

## 2017-01-20 NOTE — ED Provider Notes (Signed)
[facility] Emergency Department Provider Note ____________________________________________  Time seen: 1511  I have reviewed the triage vital signs and the nursing notes.  HISTORY  Chief Complaint  Shoulder Pain  HPI Xavier Hebert is a 71 y.o. male to the ED for evaluation of injury to his right shoulder following a mechanical fall at home.  Patient who is about 2 years status post a reverse right shoulder arthroplasty, describes tripping over a box in his storage room.  He describes falling forward with an outstretched right arm and landing with his weight on the arm.  He describes pain to the anterior and lateral aspect of the shoulder.  Denies any head injury, loss of consciousness, laceration.  He presents now about 1/2 hours after the accident with pain to the right shoulder and some limited range of motion secondary to muscle pain and spasm.  He denies any distal paresthesias, but has been holding the arm by his side and notes some numbness tightness.  Past Medical History:  Diagnosis Date  . Anxiety   . Arthritis   . Chronic pain syndrome   . Depression   . Diabetes mellitus without complication ([place])    Type II  . GERD (gastroesophageal reflux disease)   . Headache    Migraine- headache  . Hypertension   . Kidney stones   . Peptic ulcer     Patient Active Problem List   Diagnosis Date Noted  . Arthritis, senescent 08/22/2015  . Capsulitis 08/22/2015  . Type II diabetes mellitus with neurological manifestations ([place]) 03/10/2015  . Arthritis of shoulder region, degenerative 12/01/2014    Past Surgical History:  Procedure Laterality Date  . ANTERIOR CERVICAL DECOMP/DISCECTOMY FUSION  2009  . CYSTOSCOPY     x3- 2 times for stone removal   . JOINT REPLACEMENT    . REVERSE SHOULDER ARTHROPLASTY Right 12/01/2014  . REVERSE SHOULDER ARTHROPLASTY Right 12/01/2014   Procedure: REVERSE SHOULDER ARTHROPLASTY;  Surgeon: Meredith Pel, MD;   Location: [place];  Service: Orthopedics;  Laterality: Right;  . SHOULDER ARTHROSCOPY W/ ROTATOR CUFF REPAIR Right 06/10/2014   "failed"  . SHOULDER OPEN ROTATOR CUFF REPAIR Left ~ 2008  . 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 TRIGGER FINGER RELEASE Right 2001  . URETERAL STENT PLACEMENT      Prior to Admission medications   Medication Sig Start Date End Date Taking? Authorizing Provider  ACCU-CHEK COMPACT PLUS test strip  05/10/16   [provider]  bifidobacterium infantis (ALIGN) capsule Take 1 capsule by mouth daily.    [provider]  butalbital-acetaminophen-caffeine (FIORICET, ESGIC) 50-325-40 MG per tablet Take 2 tablets by mouth daily as needed for headache. Up to 2 tabs a day    [provider]  celecoxib (CELEBREX) 200 MG capsule Take 200 mg by mouth 2 (two) times daily.     [provider]  chlorthalidone (HYGROTON) 25 MG tablet Take 12.5 mg by mouth daily.    [provider]  Coconut Oil 1000 MG CAPS Take 2,000 mg by mouth 2 (two) times daily.    [provider]  fluticasone (FLONASE) 50 MCG/ACT nasal spray Place 1 spray into both nostrils 2 (two) times daily.    [provider]  hydrochlorothiazide (HYDRODIURIL) 25 MG tablet  06/01/16   [provider]  HYDROPHILIC EX Apply 1 application topically 2 (two) times daily as needed (skin irritation).    [provider]  Insulin Glargine [facility])  100 UNIT/ML SOPN  05/19/16   [provider]  insulin glargine (LANTUS) 100 UNIT/ML injection Inject 20-25 Units into the skin daily.     [provider]  lisinopril (PRINIVIL,ZESTRIL) 40 MG tablet Take 40 mg by mouth daily.     [provider]  methocarbamol (ROBAXIN) 750 MG tablet Take 750 mg by mouth 4 (four) times daily.    [provider]  Multiple Vitamin (MULTIVITAMIN) tablet Take 1 tablet by mouth daily.    [provider]   OxyCODONE (OXYCONTIN) 15 mg T12A 12 hr tablet Take 1 tablet (15 mg total) by mouth every 12 (twelve) hours. Patient not taking: Reported on 06/19/2016 12/03/14   Meredith Pel, MD  oxyCODONE (ROXICODONE) 15 MG immediate release tablet Take 1 tablet (15 mg total) by mouth every 4 (four) hours as needed for moderate pain. Patient not taking: Reported on 06/19/2016 12/03/14   Meredith Pel, MD  pantoprazole (PROTONIX) 40 MG tablet Take 80 mg by mouth 2 (two) times daily.     [provider]  predniSONE (DELTASONE) 10 MG tablet Take 1 tablet (10 mg total) by mouth 2 (two) times daily with a meal. 01/20/17 01/25/17  Jahleah Mariscal, Dannielle Karvonen, PA-C  Pregnenolone POWD Take 1 capsule by mouth.    [provider]  rosuvastatin (CRESTOR) 10 MG tablet TAKE ONE TABLET AT BEDTIME 09/03/13   [provider]  saxagliptin HCl (ONGLYZA) 5 MG TABS tablet Take 2.5 mg by mouth daily.     [provider]    Allergies Aspirin; Ibuprofen; Ioxaglate; Latex; and Ivp dye [iodinated diagnostic agents]  No family history on file.  Social History Social History  Substance Use Topics  . Smoking status: Never Smoker  . Smokeless tobacco: Never Used  . Alcohol use No    Review of Systems  Constitutional: Negative for fever. Cardiovascular: Negative for chest pain. Respiratory: Negative for shortness of breath. Musculoskeletal: Negative for back pain.  Right shoulder pain as above. Skin: Negative for rash. Neurological: Negative for headaches, focal weakness or numbness. ____________________________________________  PHYSICAL EXAM:  VITAL SIGNS: ED Triage Vitals  Enc Vitals Group     BP 01/20/17 1410 119/88     Pulse Rate 01/20/17 1410 98     Resp 01/20/17 1410 16     Temp 01/20/17 1410 98.9 F (37.2 C)     Temp Source 01/20/17 1410 Oral     SpO2 01/20/17 1410 98 %     Weight 01/20/17 1411 201 lb (91.2 kg)     Height 01/20/17 1411 6' (1.829 m)     Head  Circumference --      Peak Flow --      Pain Score --      Pain Loc --      Pain Edu? --      Excl. in [place]? --     Constitutional: Alert and oriented. Well appearing and in no distress. Head: Normocephalic and atraumatic. Neck: Supple. No thyromegaly.  Normal range of motion without crepitus.  Cardiovascular: Normal rate, regular rhythm. Normal distal pulses. Respiratory: Normal respiratory effort. No wheezes/rales/rhonchi. Musculoskeletal: Right shoulder with anterior surgical scarring and muscle deformity consistent with a deltoid muscle relocation.  Patient is without tenderness to palpation over the clavicle, AC joint, or the posterior shoulder blade.  It is with mild tenderness to palpation to the lateral deltoid musculature.  He is able to flex and extend the elbow without difficulty.  Passive range of motion  of the shoulder is limited secondary to patient's apprehension.  Normal composite fist distally.  Nontender with normal range of motion in all extremities.  Neurologic:  Normal gross sensation.  Normal intrinsic and opposition testing.  Normal UE DTRs bilaterally. Normal speech and language. No gross focal neurologic deficits are appreciated. Skin:  Skin is warm, dry and intact. No rash noted. ____________________________________________   RADIOLOGY  Right Shoulder  IMPRESSION: 1. No fracture or dislocation. No evidence of loosening of the orthopedic hardware.  I, Jourdain Guay, Dannielle Karvonen, personally viewed and evaluated these images (plain radiographs) as part of my medical decision making, as well as reviewing the written report by the radiologist. ____________________________________________  PROCEDURES  Decadron 10 mg IM Arm sling ____________________________________________  INITIAL IMPRESSION / ASSESSMENT AND PLAN / ED COURSE  Patient with ED evaluation of a mechanical fall resulting in a right shoulder strain.  Patient is 2 years status post a reverse arthroplasty  with prosthesis, radiologic evidence of fracture or dislocation.  Patient's exam is overall benign at this time.  He is placed in an arm sling for comfort and discharged with a prescription for prednisone to dose as directed.  He will follow-up with Dr. Marlou Sa next week for ongoing symptom management. ____________________________________________  FINAL CLINICAL IMPRESSION(S) / ED DIAGNOSES  Final diagnoses:  Acute pain of right shoulder  Strain of right shoulder, initial encounter  S/P reverse total shoulder arthroplasty, right      Melvenia Needles, PA-C 01/20/17 [place], MD 01/21/17 [phone]

## 2017-01-20 NOTE — ED Triage Notes (Signed)
Pt took mechanical fall, fell on right shoulder. Had previous surgery to right shoulder. Vs stable at this time.

## 2017-01-20 NOTE — ED Notes (Signed)
Pt verbalizes understanding of discharge instructions.

## 2017-01-20 NOTE — Discharge Instructions (Signed)
Your exam is consistent with a shoulder strain. Your x-ray is negative for any fracture or hardware disruption. Take your home meds along with the prescription steroid as directed. Follow-up with Dr. Marlou Sa for ongoing symptoms.

## 2017-01-22 ENCOUNTER — Telehealth: Payer: Self-pay | Admitting: *Deleted

## 2017-01-22 NOTE — Telephone Encounter (Signed)
Pt states he woke with pain in the left 1st toe and has an appt with Dr. Amalia Hailey on 01/26/2017, request refill of colchicine to get to the appt date.

## 2017-01-22 NOTE — Progress Notes (Signed)
   Subjective:  Patient presents today for follow up evaluation of intermittent pain and tenderness to the right ankle. He states the pain improved but has since returned, but not as severe. He states the injection provided relief for 1-2 weeks. Patient presents for further treatment and evaluation.   Past Medical History:  Diagnosis Date  . Anxiety   . Arthritis   . Chronic pain syndrome   . Depression   . Diabetes mellitus without complication ([place])    Type II  . GERD (gastroesophageal reflux disease)   . Headache    Migraine- headache  . Hypertension   . Kidney stones   . Peptic ulcer      Objective / Physical Exam:  General:  The patient is alert and oriented x3 in no acute distress. Dermatology:  Skin is warm, dry and supple bilateral lower extremities. Negative for open lesions or macerations. Vascular:  Palpable pedal pulses bilaterally. No edema or erythema noted. Capillary refill within normal limits. Neurological:  Epicritic and protective threshold grossly intact bilaterally.  Musculoskeletal Exam:  Pain on palpation to the anterior lateral medial aspects of the patient's right ankle. Mild edema noted. Range of motion within normal limits to all pedal and ankle joints bilateral. Muscle strength 5/5 in all groups bilateral.   pain on palpation with a palpable nodular mass noted to themedial longitudinal arch of the left plantar footconsistent withplantar fibroma/fasciitis    Assessment: #1 pain in right ankle #2 synovitis of right ankle #3 plantar fibroma/fasciitis left   Plan of Care:  #1 Patient was evaluated. #2 injection of 0.5 mL Celestone Soluspan injected in the patient's right ankle. #3 Injection of 0.5 mLs Celestone Soluspan injected into the plantar fibroma of the left foot. #4 continue wearing DM shoes #5 Return to clinic when necessary.   Edrick Kins, DPM [facility]  Dr. Edrick Kins, [place]                                         [place], [place] [address]                Office [phone]  Fax [phone]

## 2017-01-23 ENCOUNTER — Encounter (INDEPENDENT_AMBULATORY_CARE_PROVIDER_SITE_OTHER): Payer: Self-pay | Admitting: Orthopedic Surgery

## 2017-01-23 ENCOUNTER — Inpatient Hospital Stay (INDEPENDENT_AMBULATORY_CARE_PROVIDER_SITE_OTHER): Payer: Medicare Other

## 2017-01-23 ENCOUNTER — Other Ambulatory Visit (INDEPENDENT_AMBULATORY_CARE_PROVIDER_SITE_OTHER): Payer: Self-pay

## 2017-01-23 ENCOUNTER — Ambulatory Visit (INDEPENDENT_AMBULATORY_CARE_PROVIDER_SITE_OTHER): Payer: Medicare Other | Admitting: Orthopedic Surgery

## 2017-01-23 DIAGNOSIS — M25511 Pain in right shoulder: Secondary | ICD-10-CM | POA: Diagnosis not present

## 2017-01-23 DIAGNOSIS — M79601 Pain in right arm: Secondary | ICD-10-CM

## 2017-01-23 MED ORDER — COLCHICINE 0.6 MG PO TABS: 0.6000 mg | ORAL_TABLET | Freq: Every day | ORAL | 0 refills | Status: DC

## 2017-01-23 NOTE — Addendum Note (Signed)
Addended by: Daylene Posey T on: 01/23/2017 03:53 PM   Modules accepted: Miquel Dunn

## 2017-01-23 NOTE — Telephone Encounter (Signed)
Yes, that's fine 

## 2017-01-23 NOTE — Progress Notes (Signed)
Office Visit Note   Patient: Xavier Hebert           Date of Birth: 12/01/1945           MRN: [id number] Visit Date: 01/23/2017 Requested by: [facility] [address] [place], [place] [address] PCP: [place]  Subjective: Chief Complaint  Patient presents with  . Right Shoulder - Pain, Injury    HPI: [name] is a 71 year old patient with right shoulder pain.  Had reverse shoulder replacement 2 years ago and was doing extremely well with that until he had a fall a week ago.  Radiographs in [place] are reviewed and are negative for fracture or dislocation.  It sounds like it was a stretch injury which may have injured his subscapularis or other bony structures.  He reports generally profound pain with any type of motion of that right arm.  He has been in a sling.  He does take 15 mg of oxycodone every 6 hours for the pain.  He will by definition have low pain threshold for any subsequent injuries that he may sustain.              ROS: All systems reviewed are negative as they relate to the chief complaint within the history of present illness.  Patient denies  fevers or chills.   Assessment & Plan: Visit Diagnoses:  1. Acute pain of right shoulder     Plan: Impression is right shoulder pain with negative plain radiographs for fracture or dislocation.  His deltoid function is weak and I think that he may have stretched his axillary nerve with this injury.  I would like to request ultrasound examination of the deltoid attachment to see if that's been torn.  Also would like to do a CT scan to call for to evaluate for occult fracture.  He also needs nerve study to evaluate for axillary nerve function  Follow-Up Instructions: No Follow-up on file.   Orders:  No orders of the defined types were placed in this encounter.  No orders of the defined types were placed in this encounter.     Procedures: No procedures performed   Clinical Data: No additional  findings.  Objective: Vital Signs: There were no vitals taken for this visit.  Physical Exam:   Constitutional: Patient appears well-developed HEENT:  Head: Normocephalic Eyes:EOM are normal Neck: Normal range of motion Cardiovascular: Normal rate Pulmonary/chest: Effort normal Neurologic: Patient is alert Skin: Skin is warm Psychiatric: Patient has normal mood and affect    Ortho Exam: Orthopedic exam demonstrates some weakness with abduction of the right shoulder and pain with any type of range of motion.  Deltoid does still appear to be intact and attached to the acromion but that's a difficult assessment to make.  Does have more facility on the right than the left.  Motor sensory function to the right hand is intact.  Specialty Comments:  No specialty comments available.  Imaging: [place] Extrem Up Right Comp  Result Date: 01/23/2017 Ultrasound exam requested for right upper extremity to evaluate deltoid attachment    PMFS History: Patient Active Problem List   Diagnosis Date Noted  . Arthritis, senescent 08/22/2015  . Capsulitis 08/22/2015  . Type II diabetes mellitus with neurological manifestations ([place]) 03/10/2015  . Arthritis of shoulder region, degenerative 12/01/2014   Past Medical History:  Diagnosis Date  . Anxiety   . Arthritis   . Chronic pain syndrome   . Depression   .  Diabetes mellitus without complication ([place])    Type II  . GERD (gastroesophageal reflux disease)   . Headache    Migraine- headache  . Hypertension   . Kidney stones   . Peptic ulcer     No family history on file.  Past Surgical History:  Procedure Laterality Date  . ANTERIOR CERVICAL DECOMP/DISCECTOMY FUSION  2009  . CYSTOSCOPY     x3- 2 times for stone removal   . JOINT REPLACEMENT    . REVERSE SHOULDER ARTHROPLASTY Right 12/01/2014  . SHOULDER ARTHROSCOPY W/ ROTATOR CUFF REPAIR Right 06/10/2014   "failed"  . SHOULDER OPEN ROTATOR CUFF REPAIR Left ~ 2008  . TOTAL KNEE  ARTHROPLASTY Bilateral 2005-2008   "right-left"  . TRIGGER FINGER RELEASE Right 2001  . URETERAL STENT PLACEMENT     Social History   Occupational History  . Not on file  Tobacco Use  . Smoking status: Never Smoker  . Smokeless tobacco: Never Used  Substance and Sexual Activity  . Alcohol use: No  . Drug use: No  . Sexual activity: Not Currently

## 2017-01-23 NOTE — Telephone Encounter (Signed)
I informed Dr. Amalia Hailey orders for Colchicine.

## 2017-01-26 ENCOUNTER — Ambulatory Visit (INDEPENDENT_AMBULATORY_CARE_PROVIDER_SITE_OTHER): Payer: Medicare Other | Admitting: Podiatry

## 2017-01-26 DIAGNOSIS — M10072 Idiopathic gout, left ankle and foot: Secondary | ICD-10-CM

## 2017-01-26 DIAGNOSIS — M7752 Other enthesopathy of left foot: Secondary | ICD-10-CM

## 2017-01-26 MED ORDER — BETAMETHASONE SOD PHOS & ACET 6 (3-3) MG/ML IJ SUSP: 3.0000 mg | Freq: Once | INTRAMUSCULAR | Status: DC

## 2017-01-29 NOTE — Progress Notes (Signed)
   HPI: 71 year old male presents today with a complaint of a gout exacerbation to the left great toe that began about 6 days ago. He reports sharp, stabbing pain to the area. He reports associated swelling. He has been taking Colchicine 0.6 mg with some relief of the pain. He is requesting an injection. He is here for further evaluation and treatment.    Past Medical History:  Diagnosis Date  . Anxiety   . Arthritis   . Chronic pain syndrome   . Depression   . Diabetes mellitus without complication ([place])    Type II  . GERD (gastroesophageal reflux disease)   . Headache    Migraine- headache  . Hypertension   . Kidney stones   . Peptic ulcer      Physical Exam: General: The patient is alert and oriented x3 in no acute distress.  Dermatology: Skin is warm, dry and supple bilateral lower extremities. Negative for open lesions or macerations.  Vascular: Palpable pedal pulses bilaterally. No edema or erythema noted. Capillary refill within normal limits.  Neurological: Epicritic and protective threshold grossly intact bilaterally.   Musculoskeletal Exam: Pain on palpation to the left 1st MPJ with erythema and edema. Range of motion within normal limits to all pedal and ankle joints bilateral. Muscle strength 5/5 in all groups bilateral.    Assessment: - acute gout 1st MPJ left - 1st MPJ capsulitis left   Plan of Care:  - Patient evaluated. - Injection of 0.5 mLs Celestone Soluspan injected into the 1st MPJ of the left foot. - Continue taking Colcrys 0.6 mg x 1 week. - Return to clinic when necessary.    Edrick Kins, DPM [facility]  Dr. Edrick Kins, DPM    [address], [place] [address]                Office [phone]  Fax [phone]

## 2017-01-30 ENCOUNTER — Ambulatory Visit
Admission: RE | Admit: 2017-01-30 | Discharge: 2017-01-30 | Disposition: A | Discharge status: Home / Self Care | Payer: Medicare Other | Source: Ambulatory Visit | Attending: Orthopedic Surgery | Admitting: Orthopedic Surgery

## 2017-01-30 DIAGNOSIS — M79601 Pain in right arm: Secondary | ICD-10-CM | POA: Diagnosis present

## 2017-01-30 DIAGNOSIS — M75121 Complete rotator cuff tear or rupture of right shoulder, not specified as traumatic: Secondary | ICD-10-CM | POA: Diagnosis not present

## 2017-01-30 DIAGNOSIS — M25511 Pain in right shoulder: Secondary | ICD-10-CM | POA: Diagnosis present

## 2017-02-01 ENCOUNTER — Ambulatory Visit (INDEPENDENT_AMBULATORY_CARE_PROVIDER_SITE_OTHER): Payer: Medicare Other | Admitting: Physical Medicine and Rehabilitation

## 2017-02-01 ENCOUNTER — Ambulatory Visit: Payer: Medicare Other

## 2017-02-01 ENCOUNTER — Encounter (INDEPENDENT_AMBULATORY_CARE_PROVIDER_SITE_OTHER): Payer: Self-pay | Admitting: Physical Medicine and Rehabilitation

## 2017-02-01 DIAGNOSIS — R202 Paresthesia of skin: Secondary | ICD-10-CM | POA: Diagnosis not present

## 2017-02-01 DIAGNOSIS — M25511 Pain in right shoulder: Secondary | ICD-10-CM

## 2017-02-01 DIAGNOSIS — R531 Weakness: Secondary | ICD-10-CM

## 2017-02-01 DIAGNOSIS — G8929 Other chronic pain: Secondary | ICD-10-CM

## 2017-02-01 NOTE — Progress Notes (Deleted)
Recent fall- tried to catch himself with right hand. History of right shoulder replacement. Has had difficulty raising arm. Getting more range of motion back now. Still has pain when moving arm in certain directions. Right hand dominant.

## 2017-02-02 ENCOUNTER — Encounter (INDEPENDENT_AMBULATORY_CARE_PROVIDER_SITE_OTHER): Payer: Self-pay | Admitting: Physical Medicine and Rehabilitation

## 2017-02-02 NOTE — Procedures (Signed)
EMG & NCV Findings: Evaluation of the right median motor nerve showed prolonged distal onset latency (4.6 ms), reduced amplitude (3.5 mV), and decreased conduction velocity (Elbow-Wrist, 46 m/s).  The right median (across palm) sensory nerve showed prolonged distal peak latency (Wrist, 4.0 ms) and prolonged distal peak latency (Palm, 2.3 ms).  The right ulnar sensory nerve showed reduced amplitude (10.7 V).  All remaining nerves (as indicated in the following tables) were within normal limits.    All examined muscles (as indicated in the following table) showed no evidence of electrical instability.    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   There is no significant electrodiagnostic evidence of any other focal nerve entrapment (Specifically no evidence of Axillary nerve injury), brachial plexopathy or cervical radiculopathy in the right upper limb.    As you know, this particular electrodiagnostic study cannot rule out chemical radiculitis or sensory only radiculopathy.  Recommendations: 1.  Follow-up with referring physician. 2.  Continue current management of symptoms.  Shoulder weakness is likely due to full-thickness tear of the supraspinatus tendon shown on the CT scan.  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     Nerve Conduction Studies Anti Sensory Summary Table   Stim Site NR Peak (ms) Norm Peak (ms) P-T Amp (V) Norm P-T Amp Site1 Site2 Delta-P (ms) Dist (cm) Vel (m/s) Norm Vel (m/s)  Right Median Acr Palm Anti Sensory (2nd Digit)  32C  Wrist    *4.0 <3.6 15.5 >10 Wrist Palm 1.7 0.0    Palm    *2.3 <2.0 3.2         Right Radial Anti Sensory (Base 1st Digit)  33.1C  Wrist    2.3 <3.1 12.7  Wrist Base 1st Digit 2.3  0.0    Right Ulnar Anti Sensory (5th Digit)  32.1C  Wrist    3.5 <3.7 *10.7 >15.0 Wrist 5th Digit 3.5 14.0 40 >38   Motor Summary Table   Stim Site NR Onset (ms) Norm Onset (ms) O-P Amp (mV) Norm O-P Amp Site1 Site2 Delta-0 (ms) Dist (cm) Vel (m/s) Norm Vel (m/s)  Right Axillary Motor (Deltoid)  34.4C  Clavicle    4.8 <5 0.7         Right Median Motor (Abd Poll Brev)  32.8C  Wrist    *4.6 <4.2 *3.5 >5 Elbow Wrist 5.1 23.5 *46 >50  Elbow    9.7  3.3         Right Radial Motor (Ext Indicis)  34.2C  8cm    2.5 <2.5 2.0 >1.7 Up Arm 8cm 4.7 21.0 45 >40  Up Arm    7.2  1.1          EMG   Side Muscle Nerve Root Ins Act Fibs Psw Amp Dur Poly Recrt Int Fraser Din Comment  Right PronatorTeres Median C6-7 Nml Nml Nml Nml Nml 0 Nml Nml   Right Biceps Musculocut C5-6 Nml Nml Nml Nml Nml 0 Nml Nml   Right Deltoid Axillary C5-6 Nml Nml Nml Nml Nml 0 Nml Nml     Nerve Conduction Studies Anti Sensory Left/Right Comparison   Stim Site L Lat (ms) R Lat (ms) L-R Lat (ms) L Amp (V) R Amp (V) L-R Amp (%) Site1 Site2 L Vel (m/s) R  Vel (m/s) L-R Vel (m/s)  Median Acr Palm Anti Sensory (2nd Digit)  32C  Wrist  *4.0   15.5  Wrist Palm     Palm  *2.3   3.2        Radial Anti Sensory (Base 1st Digit)  33.1C  Wrist  2.3   12.7  Wrist Base 1st Digit     Ulnar Anti Sensory (5th Digit)  32.1C  Wrist  3.5   *10.7  Wrist 5th Digit  40    Motor Left/Right Comparison   Stim Site L Lat (ms) R Lat (ms) L-R Lat (ms) L Amp (mV) R Amp (mV) L-R Amp (%) Site1 Site2 L Vel (m/s) R Vel (m/s) L-R Vel (m/s)  Axillary Motor (Deltoid)  34.4C  Clavicle  4.8   0.7        Median Motor (Abd Poll Brev)  32.8C  Wrist  *4.6   *3.5  Elbow Wrist  *46   Elbow  9.7   3.3        Radial Motor (Ext Indicis)  34.2C  8cm  2.5   2.0  Up Arm 8cm  45   Up Arm  7.2   1.1           Waveforms:

## 2017-02-02 NOTE — Progress Notes (Signed)
Xavier Hebert - 71 y.o. male MRN [id number]  Date of birth: 1945-08-09  Office Visit Note: Visit Date: 02/01/2017 PCP: [place] Referred by: [place]: Chief Complaint  Patient presents with  . Right Shoulder - Pain, Weakness   HPI: Xavier Hebert is a 71 year old right-hand-dominant male with history of right reverse total shoulder arthroplasty.  Reports recent fall with outstretched arm.  He was evaluated by Dr. Marlou Sa who ordered CT scan of the shoulder as well as ultrasound scan and electrodiagnostic study to rule out axillary nerve injury.  Had a significant amount of pain does not abduct the arm very well.  He tells me that he is gotten some better that he is getting more range of motion back and he can forward flex the shoulder fairly high at this point.  Left-sided complaints.  He denies any numbness or tingling.  He does get some numbness and tingling on very few occasions hands.  He does play guitar as a hobby.  He has had prior cervical fusion.     ROS Otherwise per HPI.  Assessment & Plan: Visit Diagnoses:  1. Paresthesia of skin   2. Weakness   3. Chronic right shoulder pain     Plan: No additional findings.  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   There is no significant electrodiagnostic evidence of any other focal nerve entrapment (Specifically no evidence of Axillary nerve injury), brachial plexopathy or cervical radiculopathy in the right upper limb.    As you know, this particular electrodiagnostic study cannot rule out chemical radiculitis or sensory only radiculopathy.  Recommendations: 1.  Follow-up with referring physician. 2.  Continue current management of symptoms.  Shoulder weakness is likely due to full-thickness tear of the supraspinatus tendon  shown on the CT scan.  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   Meds & Orders: No orders of the defined types were placed in this encounter.   Orders Placed This Encounter  Procedures  . NCV with EMG (electromyography)    Follow-up: Return for Dr. Marlou Sa.   Procedures: No procedures performed  EMG & NCV Findings: Evaluation of the right median motor nerve showed prolonged distal onset latency (4.6 ms), reduced amplitude (3.5 mV), and decreased conduction velocity (Elbow-Wrist, 46 m/s).  The right median (across palm) sensory nerve showed prolonged distal peak latency (Wrist, 4.0 ms) and prolonged distal peak latency (Palm, 2.3 ms).  The right ulnar sensory nerve showed reduced amplitude (10.7 V).  All remaining nerves (as indicated in the following tables) were within normal limits.    All examined muscles (as indicated in the following table) showed no evidence of electrical instability.    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   There is no significant electrodiagnostic evidence of any other focal nerve entrapment (Specifically no evidence of Axillary nerve injury), brachial plexopathy or cervical radiculopathy in the right upper limb.    As you know, this particular electrodiagnostic study cannot rule out chemical radiculitis or sensory only radiculopathy.  Recommendations: 1.  Follow-up with referring physician. 2.  Continue current management of symptoms.  Shoulder weakness is likely due to full-thickness tear of the supraspinatus tendon shown on the CT scan.  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     Nerve Conduction  Studies Anti Sensory Summary Table   Stim Site NR Peak (ms) Norm Peak (ms) P-T Amp (V) Norm P-T Amp Site1 Site2 Delta-P (ms) Dist (cm) Vel (m/s) Norm Vel (m/s)  Right Median Acr Palm Anti Sensory (2nd Digit)  32C  Wrist    *4.0 <3.6 15.5 >10 Wrist Palm 1.7 0.0    Palm    *2.3 <2.0 3.2         Right Radial Anti Sensory (Base 1st Digit)  33.1C  Wrist    2.3 <3.1 12.7  Wrist Base 1st Digit 2.3 0.0    Right Ulnar Anti Sensory (5th Digit)  32.1C  Wrist    3.5 <3.7 *10.7 >15.0 Wrist 5th Digit 3.5 14.0 40 >38   Motor Summary Table   Stim Site NR Onset (ms) Norm Onset (ms) O-P Amp (mV) Norm O-P Amp Site1 Site2 Delta-0 (ms) Dist (cm) Vel (m/s) Norm Vel (m/s)  Right Axillary Motor (Deltoid)  34.4C  Clavicle    4.8 <5 0.7         Right Median Motor (Abd Poll Brev)  32.8C  Wrist    *4.6 <4.2 *3.5 >5 Elbow Wrist 5.1 23.5 *46 >50  Elbow    9.7  3.3         Right Radial Motor (Ext Indicis)  34.2C  8cm    2.5 <2.5 2.0 >1.7 Up Arm 8cm 4.7 21.0 45 >40  Up Arm    7.2  1.1          EMG   Side Muscle Nerve Root Ins Act Fibs Psw Amp Dur Poly Recrt Int Fraser Din Comment  Right PronatorTeres Median C6-7 Nml Nml Nml Nml Nml 0 Nml Nml   Right Biceps Musculocut C5-6 Nml Nml Nml Nml Nml 0 Nml Nml   Right Deltoid Axillary C5-6 Nml Nml Nml Nml Nml 0 Nml Nml     Nerve Conduction Studies Anti Sensory Left/Right Comparison   Stim Site L Lat (ms) R Lat (ms) L-R Lat (ms) L Amp (V) R Amp (V) L-R Amp (%) Site1 Site2 L Vel (m/s) R Vel (m/s) L-R Vel (m/s)  Median Acr Palm Anti Sensory (2nd Digit)  32C  Wrist  *4.0   15.5  Wrist Palm     Palm  *2.3   3.2        Radial Anti Sensory (Base 1st Digit)  33.1C  Wrist  2.3   12.7  Wrist Base 1st Digit     Ulnar Anti Sensory (5th Digit)  32.1C  Wrist  3.5   *10.7  Wrist 5th Digit  40    Motor Left/Right Comparison   Stim Site L Lat (ms) R Lat (ms) L-R Lat (ms) L Amp (mV) R Amp (mV) L-R Amp (%) Site1 Site2 L Vel (m/s) R Vel (m/s) L-R Vel (m/s)  Axillary Motor  (Deltoid)  34.4C  Clavicle  4.8   0.7        Median Motor (Abd Poll Brev)  32.8C  Wrist  *4.6   *3.5  Elbow Wrist  *46   Elbow  9.7   3.3        Radial Motor (Ext Indicis)  34.2C  8cm  2.5   2.0  Up  Arm 8cm  45   Up Arm  7.2   1.1           Waveforms:             Clinical History: CT OF THE UPPER RIGHT EXTREMITY WITHOUT CONTRAST 01/31/2017   TECHNIQUE: Multidetector CT imaging of the upper right extremity was performed according to the standard protocol.  COMPARISON: None.  FINDINGS: Bones/Joint/Cartilage  Right total reverse shoulder arthroplasty without failure or complication. No hardware failure or complication. No acute fracture or dislocation. Normal alignment. No joint effusion. No lytic or sclerotic osseous lesion. Partially visualize are anterior bridging osteophytes throughout the thoracic spine as can be seen with diffuse idiopathic skeletal hyperostosis.  Ligaments  Ligaments are suboptimally evaluated by CT.  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 Few intact posterior fibers.  No significant muscle atrophy.  Soft tissue No fluid collection or hematoma. No soft tissue mass. Visualize right lung is clear.  IMPRESSION: 1. 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 No significant muscle atrophy.   Cervical MRI 09/09/2015  IMPRESSION: Solid appearing fusion at C5-6 and C6-7. No features concerning for pseudarthrosis.  Central protrusions at C3-4, C4-5, and C7-T1, not clearly compressive.  Severe facet arthropathy at C3-4 bilaterally. Correlate clinically for facet mediated cervicalgia.  He reports that  has never smoked. he has never used smokeless tobacco. No results for input(s): HGBA1C, LABURIC in the last 8760 hours.  Objective:  VS:  HT:    WT:   BMI:     BP:   HR: bpm  TEMP: ( )  RESP:  Physical Exam   Musculoskeletal:  Inspection reveals some atrophy of the right supraspinatus but no atrophy of the deltoid. There is flattening of the bilateral APB but no atrophy of the bilateral FDI or hand intrinsics. There is no swelling, color changes, allodynia or dystrophic changes. There is 5 out of 5 strength in the bilateral wrist extension, finger abduction and long finger flexion. There is intact sensation to light touch in all dermatomal and peripheral nerve distributions.    Ortho Exam Imaging: No results found.  Past Medical/Family/Surgical/Social History: Medications & Allergies reviewed per EMR Patient Active Problem List   Diagnosis Date Noted  . Arthritis, senescent 08/22/2015  . Capsulitis 08/22/2015  . Diabetes ([facility]) 03/10/2015  . 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 Coronary artery disease 09/23/2013  . HTN (hypertension) 09/12/2013  . Hyperlipidemia, unspecified 09/12/2013  . Migraines 09/12/2013  . Sleep apnea 09/12/2013   Past Medical History:  Diagnosis Date  . Anxiety   . Arthritis   . Chronic pain syndrome   . Depression   . 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 GERD (gastroesophageal reflux disease)   . Headache    Migraine- headache  . Hypertension   . Kidney stones   . Peptic ulcer    History reviewed. No pertinent family history. Past Surgical History:  Procedure Laterality Date  . ANTERIOR CERVICAL DECOMP/DISCECTOMY FUSION  2009  . CYSTOSCOPY     x3- 2 times for stone removal   . JOINT REPLACEMENT    . REVERSE SHOULDER ARTHROPLASTY Right 12/01/2014  . REVERSE SHOULDER ARTHROPLASTY Right 12/01/2014   Procedure: REVERSE SHOULDER ARTHROPLASTY;  Surgeon: Meredith Pel, MD;  Location: [facility];  Service: Orthopedics;  Laterality: Right;  . SHOULDER ARTHROSCOPY W/ ROTATOR CUFF REPAIR Right 06/10/2014   "  failed"  . SHOULDER OPEN ROTATOR CUFF REPAIR Left ~ 2008  . TOTAL KNEE ARTHROPLASTY Bilateral 2005-2008    "right-left"  . TRIGGER FINGER RELEASE Right 2001  . URETERAL STENT PLACEMENT     Social History   Occupational History  . Not on file  Tobacco Use  . Smoking status: Never Smoker  . Smokeless tobacco: Never Used  Substance and Sexual Activity  . Alcohol use: No  . Drug use: No  . Sexual activity: Not Currently

## 2017-02-05 ENCOUNTER — Ambulatory Visit (INDEPENDENT_AMBULATORY_CARE_PROVIDER_SITE_OTHER): Payer: Medicare Other | Admitting: Orthopedic Surgery

## 2017-02-05 ENCOUNTER — Encounter (INDEPENDENT_AMBULATORY_CARE_PROVIDER_SITE_OTHER): Payer: Self-pay | Admitting: Orthopedic Surgery

## 2017-02-05 DIAGNOSIS — M79601 Pain in right arm: Secondary | ICD-10-CM

## 2017-02-07 NOTE — Progress Notes (Signed)
Office Visit Note   Patient: Xavier Hebert           Date of Birth: 1945/03/23           MRN: [id number] Visit Date: 02/05/2017 Requested by: [facility] [address] [place], [place] [address] PCP: [place]  Subjective: Chief Complaint  Patient presents with  . Right Shoulder - Follow-up    HPI: [name] is a 71 year old patient with right shoulder pain.  Had reverse shoulder done a couple years ago.  He was doing very well with that until he had a fall.  At that time when he came in last clinic visit had profound weakness in the arm.  Since I have seen him he has had CT scan of the shoulder which was negative for acromial fracture ultrasound which was negative for deltoid detachment and nerve conduction which was negative for axillary nerve injury.  He still is on his pain medicine regimen.  He is actually improving since his last clinic visit he now has forward flexion and abduction to about 80.              ROS: All systems reviewed are negative as they relate to the chief complaint within the history of present illness.  Patient denies  fevers or chills.   Assessment & Plan: Visit Diagnoses:  1. Right arm pain     Plan: Impression is right arm pain following a fall with unclear etiology.  I think he may have stretched or injured his axillary nerve based on where he was last clinic visit.  Nerve conduction negative for injury to that nerve.  The rest of his studies also negative.  He is improving is an.  No intervention required at this time.  He should continue to improve over the next several months.  Follow-up with me as needed  Follow-Up Instructions: Return if symptoms worsen or fail to improve.   Orders:  No orders of the defined types were placed in this encounter.  No orders of the defined types were placed in this encounter.     Procedures: No procedures performed   Clinical Data: No additional findings.  Objective: Vital Signs: There  were no vitals taken for this visit.  Physical Exam:   Constitutional: Patient appears well-developed HEENT:  Head: Normocephalic Eyes:EOM are normal Neck: Normal range of motion Cardiovascular: Normal rate Pulmonary/chest: Effort normal Neurologic: Patient is alert Skin: Skin is warm Psychiatric: Patient has normal mood and affect    Ortho Exam: Orthopedic exam demonstrates full active and passive range of motion of the left shoulder right shoulder demonstrates active abduction and forward flexion 80.  Motor sensory function to the hand is intact biceps and triceps functional.  No paresthesias in the hand.  No real crepitus or course grinding with movement of the right shoulder  Specialty Comments:  No specialty comments available.  Imaging: No results found.   PMFS History: Patient Active Problem List   Diagnosis Date Noted  . Arthritis, senescent 08/22/2015  . Capsulitis 08/22/2015  . Diabetes ([facility]) 03/10/2015  . 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 Coronary artery disease 09/23/2013  . HTN (hypertension) 09/12/2013  . Hyperlipidemia, unspecified 09/12/2013  . Migraines 09/12/2013  . Sleep apnea 09/12/2013   Past Medical History:  Diagnosis Date  . Anxiety   . Arthritis   . Chronic pain syndrome   . Depression   . Diabetes mellitus without complication ([facility])    Type  II  . GERD (gastroesophageal reflux disease)   . Headache    Migraine- headache  . Hypertension   . Kidney stones   . Peptic ulcer     History reviewed. No pertinent family history.  Past Surgical History:  Procedure Laterality Date  . ANTERIOR CERVICAL DECOMP/DISCECTOMY FUSION  2009  . CYSTOSCOPY     x3- 2 times for stone removal   . JOINT REPLACEMENT    . REVERSE SHOULDER ARTHROPLASTY Right 12/01/2014  . REVERSE SHOULDER ARTHROPLASTY Right 12/01/2014   Procedure: REVERSE SHOULDER ARTHROPLASTY;  Surgeon: Meredith Pel, MD;  Location: [facility];  Service: Orthopedics;   Laterality: Right;  . SHOULDER ARTHROSCOPY W/ ROTATOR CUFF REPAIR Right 06/10/2014   "failed"  . SHOULDER OPEN ROTATOR CUFF REPAIR Left ~ 2008  . 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 TRIGGER FINGER RELEASE Right 2001  . URETERAL STENT PLACEMENT     Social History   Occupational History  . Not on file  Tobacco Use  . Smoking status: Never Smoker  . Smokeless tobacco: Never Used  Substance and Sexual Activity  . Alcohol use: No  . Drug use: No  . Sexual activity: Not Currently

## 2017-02-13 ENCOUNTER — Telehealth (INDEPENDENT_AMBULATORY_CARE_PROVIDER_SITE_OTHER): Payer: Self-pay | Admitting: Orthopedic Surgery

## 2017-02-13 NOTE — Telephone Encounter (Signed)
Patient called saying that he is having more pain in his arm, and it has gotten weaker. He was wondering if he should get PT? CB # [phone]

## 2017-02-13 NOTE — Telephone Encounter (Signed)
Ok for pt 2 x week 4 weeks

## 2017-02-13 NOTE — Telephone Encounter (Signed)
Please advise. Thanks.  

## 2017-02-14 NOTE — Telephone Encounter (Signed)
[facility] [place]

## 2017-02-14 NOTE — Telephone Encounter (Signed)
Faxed order with OV notes and demographics

## 2017-02-16 ENCOUNTER — Telehealth (INDEPENDENT_AMBULATORY_CARE_PROVIDER_SITE_OTHER): Payer: Self-pay | Admitting: Orthopedic Surgery

## 2017-02-16 NOTE — Telephone Encounter (Signed)
refaxed order. I had already faxed this on 11/28.

## 2017-02-16 NOTE — Telephone Encounter (Signed)
Patient called and stated requested PT order to be sent to [facility] Fax# [phone]

## 2017-03-01 ENCOUNTER — Other Ambulatory Visit: Payer: Self-pay | Admitting: Internal Medicine

## 2017-03-01 DIAGNOSIS — L98499 Non-pressure chronic ulcer of skin of other sites with unspecified severity: Principal | ICD-10-CM

## 2017-03-01 DIAGNOSIS — E08622 Diabetes mellitus due to underlying condition with other skin ulcer: Secondary | ICD-10-CM

## 2017-03-02 ENCOUNTER — Ambulatory Visit (INDEPENDENT_AMBULATORY_CARE_PROVIDER_SITE_OTHER): Payer: Medicare Other | Admitting: Podiatry

## 2017-03-02 ENCOUNTER — Encounter: Payer: Self-pay | Admitting: Podiatry

## 2017-03-02 DIAGNOSIS — M7752 Other enthesopathy of left foot: Secondary | ICD-10-CM

## 2017-03-02 DIAGNOSIS — B351 Tinea unguium: Secondary | ICD-10-CM | POA: Diagnosis not present

## 2017-03-02 DIAGNOSIS — M79676 Pain in unspecified toe(s): Secondary | ICD-10-CM

## 2017-03-02 DIAGNOSIS — E0842 Diabetes mellitus due to underlying condition with diabetic polyneuropathy: Secondary | ICD-10-CM

## 2017-03-02 MED ORDER — NONFORMULARY OR COMPOUNDED ITEM: 2 refills | Status: DC

## 2017-03-05 NOTE — Progress Notes (Signed)
   SUBJECTIVE 71 year old male with PMHx of DM presenting today for follow-up evaluation of left great toe pain. He states the toe is doing well. He also complains of elongated, thickened nails. Pain while ambulating in shoes. Patient is unable to trim their own nails. Patient is here for further evaluation and treatment.   Past Medical History:  Diagnosis Date  . Anxiety   . Arthritis   . Chronic pain syndrome   . Depression   . 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 GERD (gastroesophageal reflux disease)   . Headache    Migraine- headache  . Hypertension   . Kidney stones   . Peptic ulcer     OBJECTIVE General Patient is awake, alert, and oriented x 3 and in no acute distress. Derm Skin is dry and supple bilateral. Negative open lesions or macerations. Remaining integument unremarkable. Nails are tender, long, thickened and dystrophic with subungual debris, consistent with onychomycosis, 1-5 bilateral. No signs of infection noted. Vasc  DP and PT pedal pulses palpable bilaterally. Temperature gradient within normal limits.  Neuro Epicritic and protective threshold sensation diminished bilaterally.  Musculoskeletal Exam Pain with palpation of the 1st MPJ of the left foot. No symptomatic pedal deformities noted bilateral. Muscular strength within normal limits.  ASSESSMENT 1. Diabetes Mellitus w/ peripheral neuropathy 2. 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 1st MPJ capsulitis of the left foot  PLAN OF CARE 1. Patient evaluated today. 2. Instructed to maintain good pedal hygiene and foot care. Stressed importance of controlling blood sugar.  3. Mechanical debridement of nails 1-5 bilaterally performed using a nail nipper. Filed with dremel without incident.  4. Injection of 0.5 mLs Celestone Soluspan injected into the 1st MPJ of the left foot. 5. Continue wearing DM shoes. 6. Refill for Neuropathy pain cream to be dispensed by [facility] [employer].  7. Return to clinic when necessary.      Edrick Kins, DPM [facility]  Dr. Edrick Kins, [place]                                        [place], [place] [address]                Office [phone]  Fax [phone]

## 2017-03-07 ENCOUNTER — Ambulatory Visit
Admission: RE | Admit: 2017-03-07 | Discharge: 2017-03-07 | Disposition: A | Discharge status: Home / Self Care | Payer: Medicare Other | Source: Ambulatory Visit | Attending: Internal Medicine | Admitting: Internal Medicine

## 2017-03-07 DIAGNOSIS — E08622 Diabetes mellitus due to underlying condition with other skin ulcer: Secondary | ICD-10-CM | POA: Insufficient documentation

## 2017-03-07 DIAGNOSIS — L98499 Non-pressure chronic ulcer of skin of other sites with unspecified severity: Secondary | ICD-10-CM | POA: Diagnosis not present

## 2017-04-09 ENCOUNTER — Encounter: Payer: Self-pay | Admitting: Dietician

## 2017-04-09 ENCOUNTER — Encounter: Payer: Medicare Other | Attending: Internal Medicine | Admitting: Dietician

## 2017-04-09 VITALS — BP 126/72 | Ht 72.0 in | Wt 211.4 lb

## 2017-04-09 DIAGNOSIS — E7800 Pure hypercholesterolemia, unspecified: Secondary | ICD-10-CM | POA: Insufficient documentation

## 2017-04-09 DIAGNOSIS — E119 Type 2 diabetes mellitus without complications: Secondary | ICD-10-CM | POA: Diagnosis not present

## 2017-04-09 DIAGNOSIS — I10 Essential (primary) hypertension: Secondary | ICD-10-CM | POA: Diagnosis not present

## 2017-04-09 DIAGNOSIS — Z713 Dietary counseling and surveillance: Secondary | ICD-10-CM | POA: Insufficient documentation

## 2017-04-09 NOTE — Patient Instructions (Addendum)
Check blood sugars 2 x day before breakfast and before supper every day and some 2 hr after supper Bring blood sugar records to the next appointment/class Exercise:  walk for   10  minutes  3  days a week Eat 3 meals day and small    snack a day at bedtime Space meals 4-5 hours apart Eat 3-4 carbohydrate servings/meal + protein Eat 1 carbohydrate serving/snack + protein Avoid sugar sweetened drinks (soda, tea, coffee, sports drinks, juices) Limit intake of fried foods and sweets/desserts Make healthy food choices Drink more water Make a  dentist  appointment  Get a Sharps container Carry fast acting glucose and a snack at all times Rotate injection sites Take Humalog per sliding scale only before supper (try to take 15-20 min. before eating supper meal) Carry medical alert ID at all times Return for appointment/classes on:  04-23-17

## 2017-04-09 NOTE — Progress Notes (Signed)
Diabetes Self-Management Education  Visit Type: First/Initial  Appt. Start Time: 1330 Appt. End Time: 1520  04/09/2017  [name], identified by name and date of birth, is a 72 y.o. male with a diagnosis of Diabetes: Type 2.   ASSESSMENT  Blood pressure 126/72, height 6' (1.829 m), weight 211 lb 6.4 oz (95.9 kg). Body mass index is 28.67 kg/m.  Diabetes Self-Management Education - 04/09/17 1611      Visit Information   Visit Type  First/Initial      Initial Visit   Diabetes Type  Type 2      Health Coping   How would you rate your overall health?  Good      Psychosocial Assessment   Patient Belief/Attitude about Diabetes  Motivated to manage diabetes    Self-management support  Doctor's office;Family    Other persons present  Patient    Patient Concerns  Weight Control;Glycemic Control prevent complications    Special Needs  None    Preferred Learning Style  No preference indicated all of above    Learning Readiness  Ready    What is the last grade level you completed in school?  4 year college      Pre-Education Assessment   Patient understands the diabetes disease and treatment process.  Needs Instruction    Patient understands incorporating nutritional management into lifestyle.  Needs Instruction    Patient undertands incorporating physical activity into lifestyle.  Needs Instruction    Patient understands using medications safely.  Needs Review takes Humalog 1 hour pp per sliding scale    Patient understands monitoring blood glucose, interpreting and using results  Needs Review    Patient understands prevention, detection, and treatment of acute complications.  Needs Instruction    Patient understands prevention, detection, and treatment of chronic complications.  Needs Instruction    Patient understands how to develop strategies to address psychosocial issues.  Needs Instruction    Patient understands how to develop strategies to promote health/change  behavior.  Needs Instruction      Complications   Last HgB A1C per patient/outside source  7.9 % 03-19-17    How often do you check your blood sugar?  > 4 times/day 3-7x/day=ac, some 1 hr pp, bedtime    Fasting Blood glucose range (mg/dL)  70-129;130-179    Postprandial Blood glucose range (mg/dL)  130-179;180-200;>200    Number of hypoglycemic episodes per month  1 46 x1    Can you tell when your blood sugar is low?  Yes    Have you had a dilated eye exam in the past 12 months?  Yes 9 months ago    Have you had a dental exam in the past 12 months?  No >1 year ago    Are you checking your feet?  Yes    How many days per week are you checking your feet?  7      Dietary Intake   Breakfast  eats breakfast at 6a=oatmeal or cold cereal with milk    Snack (morning)  none    Lunch  skips    Snack (afternoon)  none    Dinner  eats supper at 6p-8p; eats fried foods daily and sweets 2-3x/wk    Snack (evening)  eats snack foods or sweets at 8p-10p    Beverage(s)  drinks diet soda 6-7x/day and coffee with sweetened creamer x1  in am      Exercise   Exercise Type  ADL's  Patient Education   Previous Diabetes Education  No    Disease state   Explored patient's options for treatment of their diabetes;Definition of diabetes, type 1 and 2, and the diagnosis of diabetes    Nutrition management   Role of diet in the treatment of diabetes and the relationship between the three main macronutrients and blood glucose level;Carbohydrate counting;Food label reading, portion sizes and measuring food.    Physical activity and exercise   Role of exercise on diabetes management, blood pressure control and cardiac health.;Helped patient identify appropriate exercises in relation to his/her diabetes, diabetes complications and other health issue.    Medications  Taught/reviewed insulin injection, site rotation, insulin storage and needle disposal.;Reviewed patients medication for diabetes, action, purpose,  timing of dose and side effects.    Monitoring  Taught/discussed recording of test results and interpretation of SMBG.;Identified appropriate SMBG and/or A1C goals.;Yearly dilated eye exam;Purpose and frequency of SMBG.    Acute complications  Taught treatment of hypoglycemia - the 15 rule.    Chronic complications  Relationship between chronic complications and blood glucose control;Dental care;Lipid levels, blood glucose control and heart disease;Nephropathy, what it is, prevention of, the use of ACE, ARB's and early detection of through urine microalbumia.;Reviewed with patient heart disease, higher risk of, and prevention;Retinopathy and reason for yearly dilated eye exams;Identified and discussed with patient  current chronic complications    Personal strategies to promote health  Lifestyle issues that need to be addressed for better diabetes care;Helped patient develop diabetes management plan for (enter comment)      Outcomes   Expected Outcomes  Demonstrated interest in learning. Expect positive outcomes       Individualized Plan for Diabetes Self-Management Training:   Learning Objective:  Patient will have a greater understanding of diabetes self-management. Patient education plan is to attend individual and/or group sessions per assessed needs and concerns.   Plan:   Patient Instructions  Check blood sugars 2 x day before breakfast and before supper every day and some 2 hr after supper Bring blood sugar records to the next appointment/class Exercise:  walk for   10  minutes  3  days a week Eat 3 meals day and small    snack a day at bedtime Space meals 4-5 hours apart Eat 3-4 carbohydrate servings/meal + protein Eat 1 carbohydrate serving/snack + protein Avoid sugar sweetened drinks (soda, tea, coffee, sports drinks, juices) Limit intake of fried foods and sweets/desserts-bake, broil, grill, roast instead of frying Make healthy food choices Drink more water Make a  dentist   appointment  Get a Sharps container Carry fast acting glucose and a snack at all times Rotate injection sites Take Humalog per sliding scale only before supper-try to take 15-20 min. prior to eating meal Carry medical alert ID at all times Return for appointment/classes on:  04-23-17   Expected Outcomes:  Demonstrated interest in learning. Expect positive outcomes  Education material provided: general meal planning guidelines, food group handout, low BG handout  If problems or questions, patient to contact team via: [phone]  Future DSME appointment:   04-23-17

## 2017-04-10 ENCOUNTER — Other Ambulatory Visit: Payer: Self-pay

## 2017-04-10 NOTE — Telephone Encounter (Signed)
Patient called stating that he never received the refill for Peripheral neuropathy cream

## 2017-04-23 ENCOUNTER — Encounter: Payer: Self-pay | Admitting: Dietician

## 2017-04-23 ENCOUNTER — Encounter: Payer: Medicare Other | Attending: Internal Medicine | Admitting: Dietician

## 2017-04-23 VITALS — Ht 72.0 in | Wt 209.8 lb

## 2017-04-23 DIAGNOSIS — E7800 Pure hypercholesterolemia, unspecified: Secondary | ICD-10-CM | POA: Diagnosis not present

## 2017-04-23 DIAGNOSIS — I10 Essential (primary) hypertension: Secondary | ICD-10-CM | POA: Insufficient documentation

## 2017-04-23 DIAGNOSIS — E119 Type 2 diabetes mellitus without complications: Secondary | ICD-10-CM | POA: Diagnosis not present

## 2017-04-23 DIAGNOSIS — Z713 Dietary counseling and surveillance: Secondary | ICD-10-CM | POA: Insufficient documentation

## 2017-04-23 NOTE — Progress Notes (Signed)

## 2017-04-30 ENCOUNTER — Encounter: Payer: Medicare Other | Admitting: Dietician

## 2017-04-30 ENCOUNTER — Encounter: Payer: Self-pay | Admitting: Dietician

## 2017-04-30 VITALS — Wt 209.4 lb

## 2017-04-30 DIAGNOSIS — Z713 Dietary counseling and surveillance: Secondary | ICD-10-CM | POA: Diagnosis not present

## 2017-04-30 DIAGNOSIS — E119 Type 2 diabetes mellitus without complications: Secondary | ICD-10-CM

## 2017-04-30 NOTE — Progress Notes (Signed)
Appt. Start Time: 1730 Appt. End Time: 2030     Pt reports being upset and having marital problems today  Class 2 Nutritional Management - identify sources of carbohydrate, protein and fat; plan balanced meals; estimate servings of carbohydrates in meals  Psychosocial - identify DM as a source of stress; state the effects of stress on BG control  Exercise - describe the effects of exercise on blood glucose and importance of regular exercise in controlling diabetes; state a plan for personal exercise; verbalize contraindications for exercise  Self-Monitoring - state importance of SMBG; use SMBG results to effectively manage diabetes; identify importance of regular HbA1C testing and goals for results  Acute Complications - recognize hyperglycemia and hypoglycemia with causes and effects; identify blood glucose results as high, low or in control; list steps in treating and preventing high and low blood glucose  Sick Day Guidelines: state appropriate measure to manage blood glucose when ill (need for meds, HBGM plan, when to call physician, need for fluids)  Chronic Complications/Foot, Skin, Eye Dental Care - identify possible long-term complications of diabetes (retinopathy, neuropathy, nephropathy, cardiovascular disease, infections); explain steps in prevention and treatment of chronic complications; state importance of daily self-foot exams; describe how to examine feet and what to look for; explain appropriate eye and dental care  Lifestyle Changes/Goals - state benefits of making appropriate lifestyle changes; identify habits that need to change (meals, tobacco, alcohol); identify strategies to reduce risk factors for personal health  Pregnancy/Sexual Health - state importance of good blood glucose control in preventing sexual problems (impotence, vaginal dryness, infections, loss of desire)  Teaching Materials Used: Class 2 Slide Packet A1C Pamphlet Foot Care Literature Kidney Test  Handout Stroke Card Quick and "Balanced" Meal Ideas Carb Counting and Meal Planning Book Goals for Class 2

## 2017-05-07 ENCOUNTER — Encounter: Payer: Medicare Other | Admitting: Dietician

## 2017-05-07 ENCOUNTER — Encounter: Payer: Self-pay | Admitting: Dietician

## 2017-05-07 VITALS — BP 140/80 | Ht 72.0 in | Wt 212.1 lb

## 2017-05-07 DIAGNOSIS — Z713 Dietary counseling and surveillance: Secondary | ICD-10-CM | POA: Diagnosis not present

## 2017-05-07 DIAGNOSIS — E119 Type 2 diabetes mellitus without complications: Secondary | ICD-10-CM

## 2017-05-07 NOTE — Progress Notes (Signed)

## 2017-05-14 ENCOUNTER — Encounter: Payer: Self-pay | Admitting: Dietician

## 2017-05-14 NOTE — Progress Notes (Signed)
Pt requested appt with  EAP for stress counseling during class 3 on 05-07-17. Appt was scheduled with EAP on 05-15-17 but pt cancelled appt.

## 2017-05-15 ENCOUNTER — Telehealth: Payer: Self-pay | Admitting: Dietician

## 2017-05-15 ENCOUNTER — Encounter: Payer: Self-pay | Admitting: Dietician

## 2017-05-15 NOTE — Telephone Encounter (Signed)
pt returned call-reports that recent BG's have shown gradual improvement with recent FBG's mostly 120's-140's and 2 hr pp BG's 150's-some 200's. Pt continues to adjust Basaglar dose depending on his bedtime BG. Reports BG at  HS yesterday was 144 and he did not take Basaglar then FBG today was 146. He tried taking Basaglar 20 units as MD ordered but had FBG 46 next morning-he is very fearful of low BG's. He takes Humalog after meals per sliding scale for BG's >150. Pt ate oatmeal for breakfast today and 2 hr pp BG 220 then took Humalog 6 units resulting in BG 119. We discussed that it would be more beneficial if he were taking a set amount of Basaglar and take Humalog before meals  but he has too many distractions and may not be able to eat in 15 min. so  does not want to do this. Advised pt to contact Dr Humphrey Rolls about medications-he has initial appointment with endocrinologist Romilda Garret in [phone]

## 2017-06-01 ENCOUNTER — Ambulatory Visit: Payer: Medicare Other | Admitting: Podiatry

## 2017-06-15 ENCOUNTER — Encounter: Payer: Self-pay | Admitting: Podiatry

## 2017-06-15 ENCOUNTER — Ambulatory Visit (INDEPENDENT_AMBULATORY_CARE_PROVIDER_SITE_OTHER): Payer: Medicare Other | Admitting: Podiatry

## 2017-06-15 DIAGNOSIS — M79676 Pain in unspecified toe(s): Secondary | ICD-10-CM

## 2017-06-15 DIAGNOSIS — B351 Tinea unguium: Secondary | ICD-10-CM | POA: Diagnosis not present

## 2017-06-15 DIAGNOSIS — M659 Synovitis and tenosynovitis, unspecified: Secondary | ICD-10-CM

## 2017-06-15 DIAGNOSIS — M7752 Other enthesopathy of left foot: Secondary | ICD-10-CM | POA: Diagnosis not present

## 2017-06-20 NOTE — Progress Notes (Signed)
   SUBJECTIVE 72 year old male with PMHx of DM presenting today for follow-up evaluation of great left toe pain. He states the toe is doing better but he continues to have pain. He also reports pain in the right ankle pain that began a few months ago. He has not done anything to treat the symptoms. Walking and bearing weight increases the pain.  Patient is also complaining of elongated, thickened toenails bilaterally that cause pain while ambulating in shoes. He is unable to trim his own nails.  Patient is here for further evaluation and treatment.   Past Medical History:  Diagnosis Date  . Anxiety   . Arthritis   . Chronic pain syndrome   . Depression   . Diabetes mellitus without complication ([place])    Type II  . GERD (gastroesophageal reflux disease)   . Headache    Migraine- headache  . Hyperlipidemia   . Hypertension   . Kidney stones   . Peptic ulcer     OBJECTIVE General Patient is awake, alert, and oriented x 3 and in no acute distress. Derm Skin is dry and supple bilateral. Negative open lesions or macerations. Remaining integument unremarkable. Nails are tender, long, thickened and dystrophic with subungual debris, consistent with onychomycosis, 1-5 bilateral. No signs of infection noted. Vasc  DP and PT pedal pulses palpable bilaterally. Temperature gradient within normal limits.  Neuro Epicritic and protective threshold sensation diminished bilaterally.  Musculoskeletal Exam Pain with palpation of the 1st MPJ of the left foot. Pain with palpation of the right ankle joint. No symptomatic pedal deformities noted bilateral. Muscular strength within normal limits.  ASSESSMENT 1. Diabetes Mellitus w/ peripheral neuropathy 2. 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 1st MPJ capsulitis of the left foot 4. Right ankle synovitis   PLAN OF CARE 1. Patient evaluated today. 2. Instructed to maintain good pedal hygiene and foot care. Stressed importance of  controlling blood sugar.  3. Mechanical debridement of nails 1-5 bilaterally performed using a nail nipper. Filed with dremel without incident.  4. Injection of 0.5 mLs Celestone Soluspan injected into the 1st MPJ of the left foot. 5. Injection of 0.5 mLs Celestone Soluspan injected into the right ankle joint. 6. Return to clinic as needed.      Edrick Kins, DPM [facility]  Dr. Edrick Kins, [address]                                        [place], [place] [address]                Office [phone]  Fax [phone]

## 2017-07-26 ENCOUNTER — Ambulatory Visit (INDEPENDENT_AMBULATORY_CARE_PROVIDER_SITE_OTHER): Payer: Medicare Other | Admitting: Orthopedic Surgery

## 2017-08-01 ENCOUNTER — Ambulatory Visit (INDEPENDENT_AMBULATORY_CARE_PROVIDER_SITE_OTHER): Payer: Medicare Other | Admitting: Orthopedic Surgery

## 2017-09-18 ENCOUNTER — Encounter: Payer: Self-pay | Admitting: Podiatry

## 2017-09-18 ENCOUNTER — Ambulatory Visit (INDEPENDENT_AMBULATORY_CARE_PROVIDER_SITE_OTHER): Payer: Medicare Other | Admitting: Podiatry

## 2017-09-18 DIAGNOSIS — M65971 Unspecified synovitis and tenosynovitis, right ankle and foot: Secondary | ICD-10-CM

## 2017-09-18 DIAGNOSIS — M659 Synovitis and tenosynovitis, unspecified: Secondary | ICD-10-CM | POA: Diagnosis not present

## 2017-09-18 DIAGNOSIS — M79676 Pain in unspecified toe(s): Secondary | ICD-10-CM

## 2017-09-18 DIAGNOSIS — B351 Tinea unguium: Secondary | ICD-10-CM | POA: Diagnosis not present

## 2017-09-18 DIAGNOSIS — E0842 Diabetes mellitus due to underlying condition with diabetic polyneuropathy: Secondary | ICD-10-CM

## 2017-09-18 MED ORDER — COLCHICINE 0.6 MG PO TABS: 0.6000 mg | ORAL_TABLET | Freq: Every day | ORAL | 0 refills | Status: DC

## 2017-09-24 NOTE — Progress Notes (Signed)
   SUBJECTIVE Patient with a history of diabetes mellitus presents to office today complaining of elongated, thickened nails that cause pain while ambulating in shoes. He is unable to trim his own nails.  He also reports a return of his right ankle pain that began 3-4 weeks ago. He states it is not as severe as it was previously. He has not done anything for treatment. Walking and bearing weight for long periods of time increases the pain. Patient is here for further evaluation and treatment.   Past Medical History:  Diagnosis Date  . Anxiety   . Arthritis   . Chronic pain syndrome   . Depression   . Diabetes mellitus without complication ([place])    Type II  . GERD (gastroesophageal reflux disease)   . Headache    Migraine- headache  . Hyperlipidemia   . Hypertension   . Kidney stones   . Peptic ulcer     OBJECTIVE General Patient is awake, alert, and oriented x 3 and in no acute distress. Derm Skin is dry and supple bilateral. Negative open lesions or macerations. Remaining integument unremarkable. Nails are tender, long, thickened and dystrophic with subungual debris, consistent with onychomycosis, 1-5 bilateral. No signs of infection noted. Vasc  DP and PT pedal pulses palpable bilaterally. Temperature gradient within normal limits.  Neuro Epicritic and protective threshold sensation diminished bilaterally.  Musculoskeletal Exam Pain with palpation to the anterior, medial and lateral aspects of the right ankle. No symptomatic pedal deformities noted bilateral. Muscular strength within normal limits.  ASSESSMENT 1. Diabetes Mellitus w/ peripheral neuropathy 2. 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 Right ankle synovitis   PLAN OF CARE 1. Patient evaluated today. 2. Instructed to maintain good pedal hygiene and foot care. Stressed importance of controlling blood sugar.  3. Mechanical debridement of nails 1-5 bilaterally performed using a nail nipper. Filed with  dremel without incident.  4. Injection of 0.5 mLs Celestone Soluspan injected into the patient's right ankle joint.  5. Prescription for Colchicine 0.6 mg as needed for pain provided to patient.  6. Return to clinic in 3 mos.     Edrick Kins, DPM [facility]  Dr. Edrick Kins, [address]                                        [place], [place] [address]                Office [phone]  Fax [phone]

## 2017-10-13 ENCOUNTER — Other Ambulatory Visit: Payer: Self-pay

## 2017-10-13 ENCOUNTER — Emergency Department: Payer: Medicare Other

## 2017-10-13 ENCOUNTER — Inpatient Hospital Stay
Admission: EM | Admit: 2017-10-13 | Discharge: 2017-10-14 | DRG: 392 | Disposition: A | Discharge status: Home / Self Care | Payer: Medicare Other | Attending: Specialist | Admitting: Specialist

## 2017-10-13 ENCOUNTER — Encounter: Payer: Self-pay | Admitting: Emergency Medicine

## 2017-10-13 DIAGNOSIS — N183 Chronic kidney disease, stage 3 (moderate): Secondary | ICD-10-CM | POA: Diagnosis present

## 2017-10-13 DIAGNOSIS — K222 Esophageal obstruction: Principal | ICD-10-CM | POA: Diagnosis present

## 2017-10-13 DIAGNOSIS — R079 Chest pain, unspecified: Secondary | ICD-10-CM | POA: Diagnosis present

## 2017-10-13 DIAGNOSIS — Z79899 Other long term (current) drug therapy: Secondary | ICD-10-CM | POA: Diagnosis not present

## 2017-10-13 DIAGNOSIS — Z794 Long term (current) use of insulin: Secondary | ICD-10-CM

## 2017-10-13 DIAGNOSIS — Z91040 Latex allergy status: Secondary | ICD-10-CM

## 2017-10-13 DIAGNOSIS — Z96653 Presence of artificial knee joint, bilateral: Secondary | ICD-10-CM | POA: Diagnosis present

## 2017-10-13 DIAGNOSIS — Z79890 Hormone replacement therapy (postmenopausal): Secondary | ICD-10-CM

## 2017-10-13 DIAGNOSIS — M109 Gout, unspecified: Secondary | ICD-10-CM | POA: Diagnosis present

## 2017-10-13 DIAGNOSIS — G894 Chronic pain syndrome: Secondary | ICD-10-CM | POA: Diagnosis present

## 2017-10-13 DIAGNOSIS — Z79891 Long term (current) use of opiate analgesic: Secondary | ICD-10-CM

## 2017-10-13 DIAGNOSIS — Z8719 Personal history of other diseases of the digestive system: Secondary | ICD-10-CM | POA: Diagnosis not present

## 2017-10-13 DIAGNOSIS — I2510 Atherosclerotic heart disease of native coronary artery without angina pectoris: Secondary | ICD-10-CM | POA: Diagnosis present

## 2017-10-13 DIAGNOSIS — E785 Hyperlipidemia, unspecified: Secondary | ICD-10-CM | POA: Diagnosis present

## 2017-10-13 DIAGNOSIS — R1310 Dysphagia, unspecified: Secondary | ICD-10-CM

## 2017-10-13 DIAGNOSIS — K219 Gastro-esophageal reflux disease without esophagitis: Secondary | ICD-10-CM | POA: Diagnosis present

## 2017-10-13 DIAGNOSIS — Z886 Allergy status to analgesic agent status: Secondary | ICD-10-CM | POA: Diagnosis not present

## 2017-10-13 DIAGNOSIS — M1990 Unspecified osteoarthritis, unspecified site: Secondary | ICD-10-CM | POA: Diagnosis present

## 2017-10-13 DIAGNOSIS — Z96611 Presence of right artificial shoulder joint: Secondary | ICD-10-CM | POA: Diagnosis present

## 2017-10-13 DIAGNOSIS — Z91041 Radiographic dye allergy status: Secondary | ICD-10-CM | POA: Diagnosis not present

## 2017-10-13 DIAGNOSIS — Z7951 Long term (current) use of inhaled steroids: Secondary | ICD-10-CM

## 2017-10-13 DIAGNOSIS — Z8711 Personal history of peptic ulcer disease: Secondary | ICD-10-CM | POA: Diagnosis not present

## 2017-10-13 DIAGNOSIS — E1122 Type 2 diabetes mellitus with diabetic chronic kidney disease: Secondary | ICD-10-CM | POA: Diagnosis present

## 2017-10-13 DIAGNOSIS — E119 Type 2 diabetes mellitus without complications: Secondary | ICD-10-CM

## 2017-10-13 HISTORY — DX: Chronic kidney disease, stage 3 (moderate): N18.3

## 2017-10-13 HISTORY — DX: Chronic kidney disease, stage 3 unspecified: N18.30

## 2017-10-13 LAB — COMPREHENSIVE METABOLIC PANEL
ALT: 19 U/L (ref 0–44)
AST: 22 U/L (ref 15–41)
Albumin: 4.3 g/dL (ref 3.5–5.0)
Alkaline Phosphatase: 104 U/L (ref 38–126)
Anion gap: 5 (ref 5–15)
BUN: 40 mg/dL — ABNORMAL HIGH (ref 8–23)
CO2: 30 mmol/L (ref 22–32)
Calcium: 9.2 mg/dL (ref 8.9–10.3)
Chloride: 105 mmol/L (ref 98–111)
Creatinine, Ser: 1.98 mg/dL — ABNORMAL HIGH (ref 0.61–1.24)
GFR calc Af Amer: 38 mL/min — ABNORMAL LOW (ref >60)
GFR, EST NON AFRICAN AMERICAN: 32 mL/min — AB (ref >60)
Glucose, Bld: 149 mg/dL — ABNORMAL HIGH (ref 70–99)
POTASSIUM: 4.2 mmol/L (ref 3.5–5.1)
Sodium: 140 mmol/L (ref 135–145)
TOTAL PROTEIN: 7.9 g/dL (ref 6.5–8.1)
Total Bilirubin: 1 mg/dL (ref 0.3–1.2)

## 2017-10-13 LAB — GLUCOSE, CAPILLARY
Glucose-Capillary: 96 mg/dL (ref 70–99)
Glucose-Capillary: 118 mg/dL — ABNORMAL HIGH (ref 70–99)

## 2017-10-13 LAB — CBC
HEMATOCRIT: 42.6 % (ref 40.0–52.0)
Hemoglobin: 14.9 g/dL (ref 13.0–18.0)
MCH: 32.6 pg (ref 26.0–34.0)
MCHC: 34.9 g/dL (ref 32.0–36.0)
MCV: 93.4 fL (ref 80.0–100.0)
PLATELETS: 151 10*3/uL (ref 150–440)
RBC: 4.56 MIL/uL (ref 4.40–5.90)
RDW: 13.1 % (ref 11.5–14.5)
WBC: 8.4 10*3/uL (ref 3.8–10.6)

## 2017-10-13 LAB — LIPASE, BLOOD: LIPASE: 75 U/L — AB (ref 11–51)

## 2017-10-13 LAB — TROPONIN I: Troponin I: <0.03 ng/mL (ref <0.03)

## 2017-10-13 MED ORDER — INSULIN GLARGINE 100 UNIT/ML ~~LOC~~ SOLN
12.0000 [IU] | Freq: Every day | SUBCUTANEOUS | Status: DC
  Filled 2017-10-13 (×2): qty 0.12

## 2017-10-13 MED ORDER — OXYCODONE HCL 5 MG PO TABS
15.0000 mg | ORAL_TABLET | Freq: Four times a day (QID) | ORAL | Status: DC | PRN
  Administered 2017-10-13 – 2017-10-14 (×3): 15 mg via ORAL
  Filled 2017-10-13 (×3): qty 3

## 2017-10-13 MED ORDER — LISINOPRIL 20 MG PO TABS
20.0000 mg | ORAL_TABLET | Freq: Every day | ORAL | Status: DC
  Administered 2017-10-13 – 2017-10-14 (×2): 20 mg via ORAL
  Filled 2017-10-13 (×2): qty 1

## 2017-10-13 MED ORDER — GI COCKTAIL ~~LOC~~
15.0000 mL | Freq: Once | ORAL | Status: AC
Start: 2017-10-13 — End: 2017-10-13
  Administered 2017-10-13: 15 mL via ORAL
  Filled 2017-10-13: qty 30

## 2017-10-13 MED ORDER — GUAIFENESIN 100 MG/5ML PO SOLN
5.0000 mL | ORAL | Status: DC | PRN
Start: 2017-10-13 — End: 2017-10-14
  Administered 2017-10-14: 100 mg via ORAL
  Filled 2017-10-13 (×2): qty 5

## 2017-10-13 MED ORDER — FLUTICASONE PROPIONATE 50 MCG/ACT NA SUSP
1.0000 | Freq: Two times a day (BID) | NASAL | Status: DC
  Administered 2017-10-14: 1 via NASAL
  Filled 2017-10-13: qty 16

## 2017-10-13 MED ORDER — ALUM & MAG HYDROXIDE-SIMETH 200-200-20 MG/5ML PO SUSP
30.0000 mL | Freq: Four times a day (QID) | ORAL | Status: DC | PRN
  Administered 2017-10-14: 30 mL via ORAL
  Filled 2017-10-13: qty 30

## 2017-10-13 MED ORDER — OXYCODONE HCL 5 MG PO TABS
7.5000 mg | ORAL_TABLET | ORAL | Status: AC
  Administered 2017-10-13: 7.5 mg via ORAL
  Filled 2017-10-13: qty 2

## 2017-10-13 MED ORDER — LIDOCAINE 5 % EX PTCH
1.0000 | MEDICATED_PATCH | Freq: Every day | CUTANEOUS | Status: DC
  Administered 2017-10-14: 1 via TRANSDERMAL
  Filled 2017-10-13 (×2): qty 1

## 2017-10-13 MED ORDER — METHOCARBAMOL 750 MG PO TABS
750.0000 mg | ORAL_TABLET | Freq: Four times a day (QID) | ORAL | Status: DC
Start: 2017-10-13 — End: 2017-10-14
  Administered 2017-10-13 – 2017-10-14 (×3): 750 mg via ORAL
  Filled 2017-10-13 (×6): qty 1

## 2017-10-13 MED ORDER — INSULIN ASPART 100 UNIT/ML ~~LOC~~ SOLN: 0.0000 [IU] | Freq: Every day | SUBCUTANEOUS | Status: DC

## 2017-10-13 MED ORDER — BUTALBITAL-APAP-CAFFEINE 50-325-40 MG PO TABS
1.0000 | ORAL_TABLET | Freq: Four times a day (QID) | ORAL | Status: DC | PRN
  Administered 2017-10-13: 1 via ORAL
  Filled 2017-10-13 (×2): qty 1

## 2017-10-13 MED ORDER — INSULIN ASPART 100 UNIT/ML ~~LOC~~ SOLN: 0.0000 [IU] | Freq: Three times a day (TID) | SUBCUTANEOUS | Status: DC

## 2017-10-13 MED ORDER — AMLODIPINE BESYLATE 5 MG PO TABS
5.0000 mg | ORAL_TABLET | Freq: Every day | ORAL | Status: DC
  Administered 2017-10-13 – 2017-10-14 (×2): 5 mg via ORAL
  Filled 2017-10-13 (×2): qty 1

## 2017-10-13 MED ORDER — SODIUM CHLORIDE 0.9 % IV BOLUS
1000.0000 mL | Freq: Once | INTRAVENOUS | Status: AC
  Administered 2017-10-13: 1000 mL via INTRAVENOUS

## 2017-10-13 MED ORDER — GI COCKTAIL ~~LOC~~
15.0000 mL | Freq: Once | ORAL | Status: AC
  Administered 2017-10-13: 15 mL via ORAL
  Filled 2017-10-13: qty 30

## 2017-10-13 MED ORDER — ACETAMINOPHEN 325 MG PO TABS: 650.0000 mg | ORAL_TABLET | Freq: Four times a day (QID) | ORAL | Status: DC | PRN

## 2017-10-13 MED ORDER — ADULT MULTIVITAMIN W/MINERALS CH
1.0000 | ORAL_TABLET | Freq: Every day | ORAL | Status: DC
  Administered 2017-10-14: 1 via ORAL
  Filled 2017-10-13: qty 1

## 2017-10-13 MED ORDER — PANTOPRAZOLE SODIUM 40 MG PO TBEC
40.0000 mg | DELAYED_RELEASE_TABLET | Freq: Two times a day (BID) | ORAL | Status: DC
  Administered 2017-10-13 – 2017-10-14 (×2): 40 mg via ORAL
  Filled 2017-10-13 (×2): qty 1

## 2017-10-13 MED ORDER — ROSUVASTATIN CALCIUM 10 MG PO TABS
10.0000 mg | ORAL_TABLET | Freq: Every day | ORAL | Status: DC
  Administered 2017-10-13: 10 mg via ORAL
  Filled 2017-10-13: qty 1

## 2017-10-13 MED ORDER — SODIUM CHLORIDE 0.9 % IV SOLN
INTRAVENOUS | Status: AC
  Administered 2017-10-13 – 2017-10-14 (×2): via INTRAVENOUS

## 2017-10-13 MED ORDER — HEPARIN SODIUM (PORCINE) 5000 UNIT/ML IJ SOLN
5000.0000 [IU] | Freq: Three times a day (TID) | INTRAMUSCULAR | Status: DC
  Administered 2017-10-13: 5000 [IU] via SUBCUTANEOUS
  Filled 2017-10-13 (×2): qty 1

## 2017-10-13 MED ORDER — ACETAMINOPHEN 650 MG RE SUPP: 650.0000 mg | Freq: Four times a day (QID) | RECTAL | Status: DC | PRN

## 2017-10-13 NOTE — H&P (Signed)
[facility] NAME: Xavier Hebert    MR#:  [id number]  DATE OF BIRTH:  24-Sep-1945  DATE OF ADMISSION:  10/13/2017  PRIMARY CARE PHYSICIAN: Perrin Maltese, MD   REQUESTING/REFERRING PHYSICIAN: Dr. Delman Kitten  CHIEF COMPLAINT:   Chief Complaint  Patient presents with  . Chest Pain    HISTORY OF PRESENT ILLNESS:  Xavier Hebert  is a 72 y.o. male with a known history of  IDDM, CKD, chronic pain syndrome from back pain, hypertension, peptic ulcer disease presents to hospital secondary to dysphagia and feeling of food stuck in this chest. Patient has chronic issues with his back pain and neck pain. He was using NSAIDS in the remote past and had developed esophageal stricture and peptic ulcer disease 20 years ago. He feels that the time he also had a stricture that had to be dilated. Since then he hasn't been using any NSAIDS. He does not take any aspirin or BC powders at home. For the last three days started having pain in his chest whenever he tries to swallow anything and feeling that food is stuck. He was initially mild but got to the point today that he couldn't swallow his breakfast. As soon as he eats he feels like something is stuck in the middle of the chest and has some heartburn and pain. Denies any nausea or vomiting. He was given antibiotics for upper respiratory tract infection in about 10 days ago. Initially was azithromycin and he developed reaction to that and so was treated with Levaquin. He denies using any doxycycline. He was doing fine on his Protonix twice a day at home up until three days ago when his symptoms started.  PAST MEDICAL HISTORY:   Past Medical History:  Diagnosis Date  . Anxiety   . Arthritis   . Chronic pain syndrome   . CKD (chronic kidney disease) stage 3, GFR 30-59 ml/min (HCC)   . Depression   . Diabetes mellitus without complication ([place])    Type II  . GERD (gastroesophageal reflux disease)   .  Headache    Migraine- headache  . Hyperlipidemia   . Hypertension   . Kidney stones   . Peptic ulcer     PAST SURGICAL HISTORY:   Past Surgical History:  Procedure Laterality Date  . ANTERIOR CERVICAL DECOMP/DISCECTOMY FUSION  2009  . CYSTOSCOPY     x3- 2 times for stone removal   . JOINT REPLACEMENT    . REVERSE SHOULDER ARTHROPLASTY Right 12/01/2014  . REVERSE SHOULDER ARTHROPLASTY Right 12/01/2014   Procedure: REVERSE SHOULDER ARTHROPLASTY;  Surgeon: Meredith Pel, MD;  Location: [facility];  Service: Orthopedics;  Laterality: Right;  . SHOULDER ARTHROSCOPY W/ ROTATOR CUFF REPAIR Right 06/10/2014   "failed"  . SHOULDER OPEN ROTATOR CUFF REPAIR Left ~ 2008  . 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 TRIGGER FINGER RELEASE Right 2001  . URETERAL STENT PLACEMENT      SOCIAL HISTORY:   Social History   Tobacco Use  . Smoking status: Never Smoker  . Smokeless tobacco: Never Used  Substance Use Topics  . Alcohol use: No    FAMILY HISTORY:   Family History  Problem Relation Age of Onset  . Dementia Mother   . Alcohol abuse Father     DRUG ALLERGIES:   Allergies  Allergen Reactions  . Aspirin Other (See Comments)    Stomach pain  . Ibuprofen Other (See  Comments)    Stomach pain  . Ioxaglate Other (See Comments)    Breathing difficulty and chest pain  . Latex Itching  . Ivp Dye [Iodinated Diagnostic Agents] Other (See Comments) and Rash    Breathing difficulty and chest pain    REVIEW OF SYSTEMS:   Review of Systems  Constitutional: Negative for chills, fever, malaise/fatigue and weight loss.  HENT: Negative for ear discharge, ear pain, hearing loss, nosebleeds and tinnitus.   Eyes: Negative for blurred vision, double vision and photophobia.  Respiratory: Negative for cough, hemoptysis, shortness of breath and wheezing.   Cardiovascular: Negative for chest pain, palpitations, orthopnea and leg swelling.  Gastrointestinal: Positive for  abdominal pain and heartburn. Negative for constipation, diarrhea, melena, nausea and vomiting.       Odynophagia  Genitourinary: Negative for dysuria, frequency, hematuria and urgency.  Musculoskeletal: Positive for back pain. Negative for myalgias and neck pain.  Skin: Negative for rash.  Neurological: Negative for dizziness, tingling, tremors, sensory change, speech change, focal weakness and headaches.  Endo/Heme/Allergies: Does not bruise/bleed easily.  Psychiatric/Behavioral: Negative for depression.    MEDICATIONS AT HOME:   Prior to Admission medications   Medication Sig Start Date End Date Taking? Authorizing Provider  ACCU-CHEK COMPACT PLUS test strip  05/10/16   [provider]  allopurinol (ZYLOPRIM) 100 MG tablet  07/11/17   [provider]  amLODipine (NORVASC) 5 MG tablet Take 5 mg by mouth daily.    [provider]  bifidobacterium infantis (ALIGN) capsule Take by mouth.    [provider]  butalbital-acetaminophen-caffeine (FIORICET, ESGIC) 50-325-40 MG per tablet Take 2 tablets daily as needed by mouth for headache. Up to 3 tabs a day    [provider]  Coconut Oil 1000 MG CAPS Take 2,000 mg by mouth 2 (two) times daily.    [provider]  colchicine 0.6 MG tablet Take 1 tablet (0.6 mg total) by mouth daily. 09/18/17   Edrick Kins, DPM  DEPO-TESTOSTERONE 200 MG/ML injection Inject 1 Syringe into the muscle every 21 ( twenty-one) days. 03/15/17   [provider]  flunisolide (NASALIDE) 25 MCG/ACT (0.025%) SOLN Place 2 sprays into the nose 2 (two) times daily.    [provider]  fluticasone (FLONASE) 50 MCG/ACT nasal spray Place 1 spray into both nostrils 2 (two) times daily.    [provider]  furosemide (LASIX) 20 MG tablet Take 20 mg by mouth daily.    [provider]  Glucos-Chondroit-Hyaluron-MSM (GLUCOSAMINE CHONDROITIN JOINT PO) Take 1 tablet by mouth 2 (two) times daily.     [provider]  hydrochlorothiazide (HYDRODIURIL) 25 MG tablet  08/22/17   [provider]  HYDROPHILIC EX Apply 1 application topically 2 (two) times daily as needed (skin irritation).    [provider]  hydrOXYzine (ATARAX/VISTARIL) 50 MG tablet Take 1 tablet by mouth daily. 04/05/17   [provider]  insulin aspart (NOVOLOG FLEXPEN) 100 UNIT/ML FlexPen Novolog Flexpen U-100 Insulin aspart 100 unit/mL (3 mL) subcutaneous    [provider]  Insulin Glargine (BASAGLAR KWIKPEN) 100 UNIT/ML SOPN Inject 5-20 Units into the skin daily at 10 pm.  01/04/17   [provider]  Insulin Lispro (HUMALOG KWIKPEN [place]) Inject 3-6 Units into the skin as needed (per sliding scale).    [provider]  ketoconazole (NIZORAL) 2 % shampoo  01/25/17   [provider]  lidocaine (LIDODERM) 5 % Place 1 patch onto the skin daily. 03/09/17  [provider]  lisinopril (PRINIVIL,ZESTRIL) 40 MG tablet Take 20 mg by mouth daily.     [provider]  methocarbamol (ROBAXIN) 750 MG tablet Take 750 mg by mouth 4 (four) times daily.    [provider]  Multiple Vitamin (MULTIVITAMIN) tablet Take 1 tablet by mouth daily.    [provider]  NONFORMULARY OR COMPOUNDED ITEM See pharmacy note Patient taking differently: Apply 1 application topically daily. See pharmacy note 03/02/17   Edrick Kins, DPM  oxyCODONE (ROXICODONE) 15 MG immediate release tablet Take 15 mg every 6 (six) hours as needed by mouth for pain.    [provider]  oxyCODONE-acetaminophen (PERCOCET) 7.5-325 MG tablet Take by mouth.    [provider]  pantoprazole (PROTONIX) 40 MG tablet Take 40 mg 2 (two) times daily by mouth.     [provider]  Pregnenolone POWD Take 1 capsule by mouth daily.     [provider]  rosuvastatin (CRESTOR) 10 MG tablet TAKE ONE TABLET AT BEDTIME 09/03/13   [provider]  Saw  Palmetto 450 MG CAPS Take 2 capsules by mouth daily.    [provider]  Sildenafil Citrate (VIAGRA PO) Take by mouth.    [provider]  tapentadol HCl (NUCYNTA) 75 MG tablet Take by mouth.    [provider]  Testosterone 30 MG/ACT SOLN  08/13/17   [provider]      VITAL SIGNS:  Blood pressure 129/76, pulse 71, temperature 98.5 F (36.9 C), temperature source Oral, resp. rate 20, height 6' (1.829 m), weight 93.4 kg (206 lb), SpO2 95 %.  PHYSICAL EXAMINATION:   Physical Exam  GENERAL:  72 y.o.-year-old patient lying in the bed with no acute distress.  EYES: Pupils equal, round, reactive to light and accommodation. No scleral icterus. Extraocular muscles intact.  HEENT: Head atraumatic, normocephalic. Oropharynx and nasopharynx clear.  NECK:  Supple, no jugular venous distention. No thyroid enlargement, no tenderness.  LUNGS: Normal breath sounds bilaterally, no wheezing, rales,rhonchi or crepitation. No use of accessory muscles of respiration.  CARDIOVASCULAR: S1, S2 normal. No murmurs, rubs, or gallops.  ABDOMEN: Soft, nontender, nondistended. Bowel sounds present. No organomegaly or mass.  EXTREMITIES: No pedal edema, cyanosis, or clubbing.  NEUROLOGIC: Cranial nerves II through XII are intact. Muscle strength 5/5 in all extremities. Sensation intact. Gait not checked.  PSYCHIATRIC: The patient is alert and oriented x 3.  SKIN: No obvious rash, lesion, or ulcer. Chronic skin discoloration and changes on both lower extremities.  LABORATORY PANEL:   CBC Recent Labs  Lab 10/13/17 1203  WBC 8.4  HGB 14.9  HCT 42.6  PLT 151   ------------------------------------------------------------------------------------------------------------------  Chemistries  Recent Labs  Lab 10/13/17 1203  NA 140  K 4.2  CL 105  CO2 30  GLUCOSE 149*  BUN 40*  CREATININE 1.98*  CALCIUM 9.2  AST 22  ALT 19  ALKPHOS 104  BILITOT 1.0    ------------------------------------------------------------------------------------------------------------------  Cardiac Enzymes Recent Labs  Lab 10/13/17 1203  TROPONINI <0.03   ------------------------------------------------------------------------------------------------------------------  RADIOLOGY:  Dg Chest 2 View  Result Date: 10/13/2017 CLINICAL DATA:  Chest pain EXAM: CHEST - 2 VIEW COMPARISON:  11/24/2014 FINDINGS: Cardiac shadows within normal limits. The lungs are well aerated bilaterally. No focal infiltrate or sizable effusion is seen. No acute bony abnormality is noted. Postsurgical changes in the right shoulder are noted. IMPRESSION: No active cardiopulmonary disease. Electronically Signed   By: Inez Catalina M.D.   On:  10/13/2017 12:41    EKG:   Orders placed or performed during the hospital encounter of 10/13/17  . EKG 12-Lead  . EKG 12-Lead    IMPRESSION AND PLAN:   Xavier Hebert  is a 72 y.o. male with a known history of  IDDM, CKD stage 3, chronic pain syndrome from back pain, hypertension, peptic ulcer disease presents to hospital secondary to dysphagia and feeling of food stuck in this chest.  1. Dysphagia/ odynophagia- likely esophageal stricture or peptic ulcer disease - IV protonix, IV fluids - clear liquids tonight and NPO after midnight - GI consulted for EGD  2. IDDM- type 2 DM, decrease his lantus dose as NPO after midnight tonight - continue SSI  3. CKD stage 3- stable, gentle hydration - monitor  4. HTN- norvasc and lisinopril  5. Chronic pain syndrome- continue robaxin and oxycodone  6. DVT Prophylaxis- SQ heparin   All the records are reviewed and case discussed with ED provider. Management plans discussed with the patient, family and they are in agreement.  CODE STATUS: Full Code  TOTAL TIME TAKING CARE OF THIS PATIENT: 50 minutes.    Gladstone Lighter M.D on 10/13/2017 at 3:29 PM  Between 7am to 6pm - Pager -  [phone]  After 6pm go to [email or web address] - password [email or web address] [facility]  Office  [phone]  CC: Primary care physician; Perrin Maltese, MD

## 2017-10-13 NOTE — ED Notes (Signed)
FIRST NURSE NOTE:  Pt ambulatory from [facility] walk-in clinic, with c/o chest pain which he states is related to his hiatal hernia.  No distress noted in lobby, offered wheelchair, but declined.

## 2017-10-13 NOTE — Anesthesia Preprocedure Evaluation (Addendum)
Anesthesia Evaluation  Patient identified by MRN, date of birth, ID band Patient awake    Reviewed: Allergy & Precautions, H&P , NPO status , Patient's Chart, lab work & pertinent test results, reviewed documented beta blocker date and time   History of Anesthesia Complications Negative for: history of anesthetic complications  Airway Mallampati: III  TM Distance: >3 FB Neck ROM: full    Dental  (+) Partial Upper, Dental Advidsory Given   Pulmonary neg shortness of breath, sleep apnea , pneumonia, resolved, neg COPD, Recent URI , Residual Cough,           Cardiovascular Exercise Tolerance: Good hypertension, (-) angina+ CAD  (-) Past MI, (-) Cardiac Stents and (-) CABG (-) dysrhythmias (-) Valvular Problems/Murmurs     Neuro/Psych  Headaches, neg Seizures PSYCHIATRIC DISORDERS Anxiety Depression negative psych ROS   GI/Hepatic negative GI ROS, Neg liver ROS, PUD, GERD  ,  Endo/Other  diabetes  Renal/GU CRFRenal disease  negative genitourinary   Musculoskeletal  (+) Arthritis ,   Abdominal   Peds  Hematology negative hematology ROS (+)   Anesthesia Other Findings Past Medical History: No date: Anxiety No date: Arthritis No date: Chronic pain syndrome No date: CKD (chronic kidney disease) stage 3, GFR 30-59 ml/min (HCC) No date: Depression No date: 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 No date: GERD (gastroesophageal reflux disease) No date: Headache     Comment:  Migraine- headache No date: Hyperlipidemia No date: Hypertension No date: Kidney stones No date: Peptic ulcer   Reproductive/Obstetrics negative OB ROS                            Anesthesia Physical Anesthesia Plan  ASA: III  Anesthesia Plan: General   Post-op Pain Management:    Induction: Intravenous  PONV Risk Score and Plan: 2 and Propofol infusion and TIVA  Airway Management  Planned: Nasal Cannula and Natural Airway  Additional Equipment:   Intra-op Plan:   Post-operative Plan:   Informed Consent: I have reviewed the patients History and Physical, chart, labs and discussed the procedure including the risks, benefits and alternatives for the proposed anesthesia with the patient or authorized representative who has indicated his/her understanding and acceptance.   Dental Advisory Given  Plan Discussed with: Anesthesiologist, CRNA and Surgeon  Anesthesia Plan Comments:         Anesthesia Quick Evaluation

## 2017-10-13 NOTE — Progress Notes (Signed)
Pt complaining of heartburn along with headache. Primary nurse paged and spoke to Dr. Tressia Miners. Orders received for Maylox and Fieroset. Primary nurse to continue to monitor.

## 2017-10-13 NOTE — ED Provider Notes (Signed)
[facility] Emergency Department Provider Note   ____________________________________________   First MD Initiated Contact with Patient 10/13/17 1132     (approximate)  I have reviewed the triage vital signs and the nursing notes.   HISTORY  Chief Complaint Chest Pain  Feels like food is getting stuck  HPI Xavier Hebert is a 72 y.o. male reports for the last 3 days after eating he is felt a sensation like food is getting stuck in his esophagus after he swallows it.  Started 3 evenings ago while eating dinner, he has not had any chicken bones or large meat pieces.  Reports he has a history of stomach ulcers when he was younger, and is currently on daily acid reducing medications.  Over the last 3 days after eating meals, after he swallows things sometimes it feels like things like eggs or chicken or getting stuck.  This morning he was eating eggs, is able to swallow and drink his liquids with them when he swallowed his agate suddenly felt like it got stuck he had "10 out of 10 pain in the center of his chest that felt like a stuck feeling, it went away after a few minutes time.  He was able to swallow secretions, but reports it felt like it had a hard time passing.  Feels much better now, and his pain is down to about a 1.  Denies pain in the abdomen.  No right-sided abdominal pain.  No fevers or chills.  No vomiting.  No black or bloody stools.  Does not take any NSAIDs or aspirin daily.  Denies history of any heart disease.  Denies pain with exertion.  Reports the pain is very much associated with eating and happens right after swallowing foods within a minute or 2 swallowing through the last 3 days.  Reports he had a previous stricture of the esophagus and ulcer which caused the same pain in the past and he did have to have a esophageal "stretching" with a balloon several years ago for same  Past Medical History:  Diagnosis Date  . Anxiety   . Arthritis   .  Chronic pain syndrome   . Depression   . Diabetes mellitus without complication ([place])    Type II  . GERD (gastroesophageal reflux disease)   . Headache    Migraine- headache  . Hyperlipidemia   . Hypertension   . Kidney stones   . Peptic ulcer     Patient Active Problem List   Diagnosis Date Noted  . Enthesopathy of ankle and tarsus 08/22/2015  . Capsulitis 08/22/2015  . Diabetes ([place]) 03/10/2015  . 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 Coronary artery disease 09/23/2013  . HTN (hypertension) 09/12/2013  . Hyperlipidemia, unspecified 09/12/2013  . Migraines 09/12/2013  . Sleep apnea 09/12/2013    Past Surgical History:  Procedure Laterality Date  . ANTERIOR CERVICAL DECOMP/DISCECTOMY FUSION  2009  . CYSTOSCOPY     x3- 2 times for stone removal   . JOINT REPLACEMENT    . REVERSE SHOULDER ARTHROPLASTY Right 12/01/2014  . REVERSE SHOULDER ARTHROPLASTY Right 12/01/2014   Procedure: REVERSE SHOULDER ARTHROPLASTY;  Surgeon: Meredith Pel, MD;  Location: [facility];  Service: Orthopedics;  Laterality: Right;  . SHOULDER ARTHROSCOPY W/ ROTATOR CUFF REPAIR Right 06/10/2014   "failed"  . SHOULDER OPEN ROTATOR CUFF REPAIR Left ~ 2008  . 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 TRIGGER FINGER RELEASE Right 2001  .  URETERAL STENT PLACEMENT      Prior to Admission medications   Medication Sig Start Date End Date Taking? Authorizing Provider  ACCU-CHEK COMPACT PLUS test strip  05/10/16   [provider]  allopurinol (ZYLOPRIM) 100 MG tablet  07/11/17   [provider]  amLODipine (NORVASC) 5 MG tablet Take 5 mg by mouth daily.    [provider]  bifidobacterium infantis (ALIGN) capsule Take by mouth.    [provider]  butalbital-acetaminophen-caffeine (FIORICET, ESGIC) 50-325-40 MG per tablet Take 2 tablets daily as needed by mouth for headache. Up to 3 tabs a day    [provider]  Coconut Oil  1000 MG CAPS Take 2,000 mg by mouth 2 (two) times daily.    [provider]  colchicine 0.6 MG tablet Take 1 tablet (0.6 mg total) by mouth daily. 09/18/17   Edrick Kins, DPM  DEPO-TESTOSTERONE 200 MG/ML injection Inject 1 Syringe into the muscle every 21 ( twenty-one) days. 03/15/17   [provider]  flunisolide (NASALIDE) 25 MCG/ACT (0.025%) SOLN Place 2 sprays into the nose 2 (two) times daily.    [provider]  fluticasone (FLONASE) 50 MCG/ACT nasal spray Place 1 spray into both nostrils 2 (two) times daily.    [provider]  furosemide (LASIX) 20 MG tablet Take 20 mg by mouth daily.    [provider]  Glucos-Chondroit-Hyaluron-MSM (GLUCOSAMINE CHONDROITIN JOINT PO) Take 1 tablet by mouth 2 (two) times daily.    [provider]  hydrochlorothiazide (HYDRODIURIL) 25 MG tablet  08/22/17   [provider]  HYDROPHILIC EX Apply 1 application topically 2 (two) times daily as needed (skin irritation).    [provider]  hydrOXYzine (ATARAX/VISTARIL) 50 MG tablet Take 1 tablet by mouth daily. 04/05/17   [provider]  insulin aspart (NOVOLOG FLEXPEN) 100 UNIT/ML FlexPen Novolog Flexpen U-100 Insulin aspart 100 unit/mL (3 mL) subcutaneous    [provider]  Insulin Glargine (BASAGLAR KWIKPEN) 100 UNIT/ML SOPN Inject 5-20 Units into the skin daily at 10 pm.  01/04/17   [provider]  Insulin Lispro (HUMALOG KWIKPEN [place]) Inject 3-6 Units into the skin as needed (per sliding scale).    [provider]  ketoconazole (NIZORAL) 2 % shampoo  01/25/17   [provider]  lidocaine (LIDODERM) 5 % Place 1 patch onto the skin daily. 03/09/17   [provider]  lisinopril (PRINIVIL,ZESTRIL) 40 MG tablet Take 20 mg by mouth daily.     [provider]  methocarbamol (ROBAXIN) 750 MG tablet Take 750 mg by mouth 4 (four) times daily.    [provider]  Multiple Vitamin  (MULTIVITAMIN) tablet Take 1 tablet by mouth daily.    [provider]  NONFORMULARY OR COMPOUNDED ITEM See pharmacy note Patient taking differently: Apply 1 application topically daily. See pharmacy note 03/02/17   Edrick Kins, DPM  oxyCODONE (ROXICODONE) 15 MG immediate release tablet Take 15 mg every 6 (six) hours as needed by mouth for pain.    [provider]  oxyCODONE-acetaminophen (PERCOCET) 7.5-325 MG tablet Take by mouth.    [provider]  pantoprazole (PROTONIX) 40 MG tablet Take 40 mg 2 (two) times daily by mouth.     [provider]  Pregnenolone POWD Take 1 capsule by mouth daily.     [provider]  rosuvastatin (CRESTOR) 10 MG tablet TAKE ONE TABLET AT BEDTIME 09/03/13   [provider]  [facility]  450 MG CAPS Take 2 capsules by mouth daily.    [provider]  Sildenafil Citrate (VIAGRA PO) Take by mouth.    [provider]  tapentadol HCl (NUCYNTA) 75 MG tablet Take by mouth.    [provider]  Testosterone 30 MG/ACT SOLN  08/13/17   [provider]    Allergies Aspirin; Ibuprofen; Ioxaglate; Latex; and Ivp dye [iodinated diagnostic agents]  No family history on file.  Social History Social History   Tobacco Use  . Smoking status: Never Smoker  . Smokeless tobacco: Never Used  Substance Use Topics  . Alcohol use: No  . Drug use: No    Review of Systems Constitutional: No fever/chills Eyes: No visual changes. ENT: No sore throat. Cardiovascular: See HPI  respiratory: Denies shortness of breath. Gastrointestinal: No abdominal pain.  No nausea, no vomiting.  No diarrhea.  No constipation. Genitourinary: Negative for dysuria. Musculoskeletal: Negative for back pain. Skin: Negative for rash. Neurological: Negative for headaches, focal weakness or numbness.    ____________________________________________   PHYSICAL EXAM:  VITAL SIGNS: ED Triage Vitals  Enc  Vitals Group     BP 10/13/17 1057 131/80     Pulse Rate 10/13/17 1057 70     Resp 10/13/17 1057 18     Temp 10/13/17 1057 98.5 F (36.9 C)     Temp Source 10/13/17 1057 Oral     SpO2 10/13/17 1057 100 %     Weight 10/13/17 1104 206 lb (93.4 kg)     Height 10/13/17 1104 6' (1.829 m)     Head Circumference --      Peak Flow --      Pain Score 10/13/17 1103 4     Pain Loc --      Pain Edu? --      Excl. in [place]? --     Constitutional: Alert and oriented. Well appearing and in no acute distress. Eyes: Conjunctivae are normal. Head: Atraumatic. Nose: No congestion/rhinnorhea. Mouth/Throat: Mucous membranes are moist. Neck: No stridor.   Cardiovascular: Normal rate, regular rhythm. Grossly normal heart sounds.  Good peripheral circulation. Respiratory: Normal respiratory effort.  No retractions. Lungs CTAB. Gastrointestinal: Soft and nontender. No distention. Musculoskeletal: No lower extremity tenderness nor edema. Neurologic:  Normal speech and language. No gross focal neurologic deficits are appreciated.  Skin:  Skin is warm, dry and intact. No rash noted. Psychiatric: Mood and affect are normal. Speech and behavior are normal.  ____________________________________________   LABS (all labs ordered are listed, but only abnormal results are displayed)  Labs Reviewed  COMPREHENSIVE METABOLIC PANEL - Abnormal; Notable for the following components:      Result Value   Glucose, Bld 149 (*)    BUN 40 (*)    Creatinine, Ser 1.98 (*)    GFR calc non Af Amer 32 (*)    GFR calc Af Amer 38 (*)    All other components within normal limits  LIPASE, BLOOD - Abnormal; Notable for the following components:   Lipase 75 (*)    All other components within normal limits  CBC  TROPONIN I   ____________________________________________  EKG  ED ECG REPORT I, Delman Kitten, the attending physician, personally viewed and interpreted this ECG.  Date: 10/13/2017 EKG Time: 11 AM Rate:  55 Rhythm: Sinus bradycardia  QRS Axis: normal Intervals: normal ST/T Wave abnormalities: normal Narrative Interpretation: no evidence of acute ischemia, slight sinus bradycardia  ____________________________________________  RADIOLOGY  Chest x-ray reviewed negative for acute  ____________________________________________   PROCEDURES  Procedure(s) performed: None  Procedures  Critical Care performed: No  ____________________________________________   INITIAL IMPRESSION / ASSESSMENT AND PLAN / ED COURSE  Pertinent labs & imaging results that were available during my care of the patient were reviewed by me and considered in my medical decision making (see chart for details).  Presents for evaluation of chest pain that feels like food being stuck.  Clinical history seem to suggest that he is likely developing some sort of esophageal ulceration stricture or hiatal hernia.  His pain is much better now but he is consistently developing chest discomfort and a stuck sensation retrosternally after eating for about 2 to 3 days.  Hemodynamically stable.  EKG reassuring.  Symptoms seem very gastrointestinal and atypical of cardiac symptoms.  No fever, no symptoms to suggest mediastinitis.  Will obtain chest x-ray, check a troponin, and plan I discussed the case with gastroenterology if work-up reassuring.  No right upper quadrant pain.  Negative Murphy.  ----------------------------------------- 2:03 PM on 10/13/2017 -----------------------------------------  Case and care discussed with Dr. Marius Ditch, given the patient's elevated BUN of 40 with a mild bump in his creatinine and his inability to tolerate solids at this point, also what I's believe is likely gastrointestinal etiology of his symptoms including likely type of esophageal pathology will admit the patient for hydration, GI consult.  Dr. Derl Barrow reports she will see the patient in consult today.  Discussed with the patient, patient and  his family agreeable with plan for admission.      ____________________________________________   FINAL CLINICAL IMPRESSION(S) / ED DIAGNOSES  Final diagnoses:  Chest pain, unspecified type  History of esophageal stricture      NEW MEDICATIONS STARTED DURING THIS VISIT:  New Prescriptions   No medications on file     Note:  This document was prepared using Dragon voice recognition software and may include unintentional dictation errors.     Delman Kitten, MD 10/13/17 1404

## 2017-10-13 NOTE — ED Notes (Signed)
Report to 2c lea

## 2017-10-13 NOTE — Consult Note (Signed)
Xavier Darby, MD [address]  [place]  [place], [place] [address]  Main: [phone]  Fax: [phone] Pager: [phone]   Consultation  Referring Provider:     No ref. provider found Primary Care Physician:  Perrin Maltese, MD Primary Gastroenterologist:  Dr. Sherri Sear         Reason for Consultation:     Dysphagia   Date of Admission:  10/13/2017 Date of Consultation:  10/13/2017         HPI:   Xavier Hebert is a 72 y.o. Caucasian male with history of metabolic syndrome, chronic opioid use secondary to chronic pain from several orthopedic surgeries, previous history of peptic ulcer disease, hiatal hernia presents with 3 days history of severe dysphagia to solids. He is able to tolerate liquids and able to swallow pills. He reports that he had similar symptoms about 20 years ago and his esophagus was dilated. He does notice that he has to sit upright and wait for the food to go down prior to the worsening of his symptoms. He lost over 60 pounds about 2 years ago when his wife passed away. He got remarried about 1 year ago. He did not lose any weight since then He reports taking Protonix 40 mg twice daily  NSAIDs: none  Antiplts/Anticoagulants/Anti thrombotics: none  GI Procedures: colonoscopy several years ago He denies smoking or drinking alcohol in his life  Past Medical History:  Diagnosis Date  . Anxiety   . Arthritis   . Chronic pain syndrome   . CKD (chronic kidney disease) stage 3, GFR 30-59 ml/min ([facility])   . Depression   . Diabetes mellitus without complication ([place])    Type II  . GERD (gastroesophageal reflux disease)   . Headache    Migraine- headache  . Hyperlipidemia   . Hypertension   . Kidney stones   . Peptic ulcer     Past Surgical History:  Procedure Laterality Date  . ANTERIOR CERVICAL DECOMP/DISCECTOMY FUSION  2009  . CYSTOSCOPY     x3- 2 times for stone removal   . JOINT REPLACEMENT    . REVERSE SHOULDER ARTHROPLASTY  Right 12/01/2014  . REVERSE SHOULDER ARTHROPLASTY Right 12/01/2014   Procedure: REVERSE SHOULDER ARTHROPLASTY;  Surgeon: Meredith Pel, MD;  Location: [facility];  Service: Orthopedics;  Laterality: Right;  . SHOULDER ARTHROSCOPY W/ ROTATOR CUFF REPAIR Right 06/10/2014   "failed"  . SHOULDER OPEN ROTATOR CUFF REPAIR Left ~ 2008  . 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 TRIGGER FINGER RELEASE Right 2001  . URETERAL STENT PLACEMENT      Prior to Admission medications   Medication Sig Start Date End Date Taking? Authorizing Provider  albuterol (PROVENTIL HFA;VENTOLIN HFA) 108 (90 Base) MCG/ACT inhaler Inhale 1 to 2 puffs into the lungs every 4 to 6 hours as needed for shortness of breath/wheezing 10/01/17  Yes [provider]  allopurinol (ZYLOPRIM) 100 MG tablet Take 100 mg by mouth daily.  07/11/17  Yes [provider]  amLODipine (NORVASC) 5 MG tablet Take 5 mg by mouth daily.   Yes [provider]  bifidobacterium infantis (ALIGN) capsule Take 1 capsule by mouth daily.    Yes [provider]  butalbital-acetaminophen-caffeine (FIORICET, ESGIC) 50-325-40 MG per tablet Take 2 tablets by mouth at start of headache and take 1 additional tablet by mouth after 2 hours if needed - max 3 tablets daily   Yes [provider]  cholecalciferol (VITAMIN D) 1000 units tablet Take 1,000 Units by mouth daily.   Yes [provider]  clobetasol cream (TEMOVATE) 0.05 % Apply to affected area(s) 2 to 3 times weekly as needed   Yes [provider]  Coconut Oil 1000 MG CAPS Take 2,000 mg by mouth 2 (two) times daily.   Yes [provider]  colchicine 0.6 MG tablet Take 1 tablet (0.6 mg total) by mouth daily. 09/18/17  Yes Xavier Hebert, DPM  flunisolide (NASALIDE) 25 MCG/ACT (0.025%) SOLN Place 2 sprays into the nose 2 (two) times daily.   Yes [provider]  fluticasone (FLONASE) 50 MCG/ACT nasal spray Place 1  spray into both nostrils 2 (two) times daily.   Yes [provider]  hydrochlorothiazide (HYDRODIURIL) 25 MG tablet Take 25 mg by mouth daily.  08/22/17  Yes [provider]  HYDROPHILIC EX Apply 1 application topically 2 (two) times daily as needed (skin irritation).   Yes [provider]  hydrOXYzine (ATARAX/VISTARIL) 50 MG tablet Take 1 tablet by mouth every 6 (six) hours as needed for itching.  04/05/17  Yes [provider]  insulin aspart (NOVOLOG FLEXPEN) 100 UNIT/ML FlexPen Inject at mealtimes as needed for glucose control   Yes [provider]  Insulin Glargine (BASAGLAR KWIKPEN) 100 UNIT/ML SOPN Inject 5-20 Units into the skin daily at 10 pm.  01/04/17  Yes [provider]  lisinopril (PRINIVIL,ZESTRIL) 40 MG tablet Take 20 mg by mouth daily.    Yes [provider]  methocarbamol (ROBAXIN) 750 MG tablet Take 750 mg by mouth 4 (four) times daily.   Yes [provider]  Multiple Vitamin (MULTIVITAMIN) tablet Take 1 tablet by mouth daily.   Yes [provider]  NONFORMULARY OR COMPOUNDED ITEM See pharmacy note Patient taking differently: Apply 1 application topically daily. See pharmacy note 03/02/17  Yes Xavier Hebert, DPM  oxyCODONE (ROXICODONE) 15 MG immediate release tablet Take 15 mg by mouth every 6 (six) hours.    Yes [provider]  pantoprazole (PROTONIX) 40 MG tablet Take 40 mg 2 (two) times daily by mouth.    Yes [provider]  rosuvastatin (CRESTOR) 10 MG tablet Take 10 mg by mouth at bedtime.    Yes [provider]  Saw [place] 450 MG CAPS Take 2 capsules by mouth daily.   Yes [provider]  Sildenafil Citrate (VIAGRA PO) Take by mouth.   Yes [provider]  Testosterone 30 MG/ACT SOLN Apply 1 pump transdermal under each arm daily   Yes [provider]    Family History  Problem Relation Age of Onset  . Dementia Mother   . Alcohol abuse  Father      Social History   Tobacco Use  . Smoking status: Never Smoker  . Smokeless tobacco: Never Used  Substance Use Topics  . Alcohol use: No  . Drug use: No    Allergies as of 10/13/2017 - Review Complete 10/13/2017  Allergen Reaction Noted  . Aspirin Other (See Comments) 11/06/2013  . Ibuprofen Other (See Comments) 11/06/2013  . Ioxaglate Other (See Comments) 01/27/2015  . Latex Itching 11/20/2014  . Ivp dye [iodinated diagnostic agents] Other (See Comments) and Rash 11/06/2013    Review of Systems:    All systems reviewed and negative except where noted in HPI.   Physical Exam:  Vital signs in last 24 hours: Temp:  [98.1 F (36.7 C)-98.5 F (36.9 C)] 98.1 F (36.7 C) (07/27  1622) Pulse Rate:  [63-108] 70 (07/27 1622) Resp:  [12-21] 18 (07/27 1622) BP: (122-161)/(76-120) 125/96 (07/27 1622) SpO2:  [94 %-100 %] 99 % (07/27 1622) Weight:  [206 lb (93.4 kg)] 206 lb (93.4 kg) (07/27 1104)   General:   Pleasant, cooperative in NAD Head:  Normocephalic and atraumatic. Eyes:   No icterus.   Conjunctiva pink. PERRLA. Ears:  Normal auditory acuity. Neck:  Supple; no masses or thyroidomegaly Lungs: Respirations even and unlabored. Lungs clear to auscultation bilaterally.   No wheezes, crackles, or rhonchi.  Heart:  Regular rate and rhythm;  Without murmur, clicks, rubs or gallops Abdomen:  Soft, nondistended, nontender. Normal bowel sounds. No appreciable masses or hepatomegaly.  No rebound or guarding.  Rectal:  Not performed. Msk:  Symmetrical without gross deformities.  Strength normal  Extremities:  Without edema, cyanosis or clubbing. Neurologic:  Alert and oriented x3;  grossly normal neurologically. Skin:  Intact without significant lesions or rashes. Cervical Nodes:  No significant cervical adenopathy. Psych:  Alert and cooperative. Normal affect.  LAB RESULTS: CBC Latest Ref Rng & Units 10/13/2017 12/01/2014 11/24/2014  WBC 3.8 - 10.6 K/uL 8.4 7.1 6.3    Hemoglobin 13.0 - 18.0 g/dL 14.9 12.7(L) 14.9  Hematocrit 40.0 - 52.0 % 42.6 37.6(L) 43.7  Platelets 150 - 440 K/uL 151 106(L) 129(L)    BMET BMP Latest Ref Rng & Units 10/13/2017 12/01/2014 11/24/2014  Glucose 70 - 99 mg/dL 149(H) - 150(H)  BUN 8 - 23 mg/dL 40(H) - 21(H)  Creatinine 0.61 - 1.24 mg/dL 1.98(H) 1.76(H) 1.57(H)  Sodium 135 - 145 mmol/L 140 - 137  Potassium 3.5 - 5.1 mmol/L 4.2 - 3.2(L)  Chloride 98 - 111 mmol/L 105 - 97(L)  CO2 22 - 32 mmol/L 30 - 31  Calcium 8.9 - 10.3 mg/dL 9.2 - 8.7(L)    LFT Hepatic Function Latest Ref Rng & Units 10/13/2017  Total Protein 6.5 - 8.1 g/dL 7.9  Albumin 3.5 - 5.0 g/dL 4.3  AST 15 - 41 U/L 22  ALT 0 - 44 U/L 19  Alk Phosphatase 38 - 126 U/L 104  Total Bilirubin 0.3 - 1.2 mg/dL 1.0     STUDIES: Dg Chest 2 View  Result Date: 10/13/2017 CLINICAL DATA:  Chest pain EXAM: CHEST - 2 VIEW COMPARISON:  11/24/2014 FINDINGS: Cardiac shadows within normal limits. The lungs are well aerated bilaterally. No focal infiltrate or sizable effusion is seen. No acute bony abnormality is noted. Postsurgical changes in the right shoulder are noted. IMPRESSION: No active cardiopulmonary disease. Electronically Signed   By: Inez Catalina M.D.   On: 10/13/2017 12:41      Impression / Plan:   MAJESTIC BRISTER is a 72 y.o. Caucasian male with known history of peptic ulcer disease, possible esophageal stricture status post dilation 20 years ago, presents with progressively worsening dysphagia to solids on long term PPI use   Differentials include peptic stricture or malignancy or achalasia or eosinophilic esophagitis  Clear liquid diet only Nothing by mouth past midnight PPI twice a day EGD tomorrow  I have discussed alternative options, risks & benefits,  which include, but are not limited to, bleeding, infection, perforation,respiratory complication & drug reaction.  The patient agrees with this plan & written consent will be obtained.    Thank you for  involving me in the care of this patient.      LOS: 0 days   Sherri Sear, MD  10/13/2017, 4:28 PM   Note: This dictation  was prepared with Dragon dictation along with smaller phrase technology. Any transcriptional errors that result from this process are unintentional.

## 2017-10-13 NOTE — ED Triage Notes (Signed)
Pt c/o chest pain that he can pinpoint to left rib left of sternum. Pain has gotten worse over last three days. Denies any recent falls, injuries.   States it gets "off the scale with pain" each time he tries to eat. Afraid to take morning medications - has not taken any. Daily morning meds include: lisinopril, norvasc, HCTZ 25mg  and protonix 40mg .  Approx 20 years ago, pt has hiatal hernia with ulcerations around hernia in esophagus. Also 21 ulcers in stomach and a duodenal ulcer. Reports this pain feels similar to that.   Has hx of having to have esophagus stretched.

## 2017-10-14 ENCOUNTER — Inpatient Hospital Stay: Payer: Medicare Other | Admitting: Anesthesiology

## 2017-10-14 ENCOUNTER — Encounter: Payer: Self-pay | Admitting: *Deleted

## 2017-10-14 ENCOUNTER — Encounter
Admission: EM | Disposition: A | Discharge status: Home / Self Care | Payer: Self-pay | Source: Home / Self Care | Attending: Specialist

## 2017-10-14 DIAGNOSIS — K222 Esophageal obstruction: Secondary | ICD-10-CM | POA: Diagnosis not present

## 2017-10-14 DIAGNOSIS — R1310 Dysphagia, unspecified: Secondary | ICD-10-CM | POA: Diagnosis not present

## 2017-10-14 DIAGNOSIS — R079 Chest pain, unspecified: Secondary | ICD-10-CM | POA: Diagnosis not present

## 2017-10-14 HISTORY — PX: ESOPHAGOGASTRODUODENOSCOPY: SHX5428

## 2017-10-14 LAB — BASIC METABOLIC PANEL
Anion gap: 4 — ABNORMAL LOW (ref 5–15)
BUN: 33 mg/dL — ABNORMAL HIGH (ref 8–23)
CALCIUM: 8 mg/dL — AB (ref 8.9–10.3)
CHLORIDE: 106 mmol/L (ref 98–111)
CO2: 28 mmol/L (ref 22–32)
CREATININE: 1.71 mg/dL — AB (ref 0.61–1.24)
GFR calc Af Amer: 45 mL/min — ABNORMAL LOW (ref >60)
GFR calc non Af Amer: 39 mL/min — ABNORMAL LOW (ref >60)
Glucose, Bld: 110 mg/dL — ABNORMAL HIGH (ref 70–99)
Potassium: 3.8 mmol/L (ref 3.5–5.1)
Sodium: 138 mmol/L (ref 135–145)

## 2017-10-14 LAB — GLUCOSE, CAPILLARY
GLUCOSE-CAPILLARY: 106 mg/dL — AB (ref 70–99)
Glucose-Capillary: 104 mg/dL — ABNORMAL HIGH (ref 70–99)

## 2017-10-14 LAB — CBC
HCT: 36.4 % — ABNORMAL LOW (ref 40.0–52.0)
Hemoglobin: 12.6 g/dL — ABNORMAL LOW (ref 13.0–18.0)
MCH: 32.3 pg (ref 26.0–34.0)
MCHC: 34.7 g/dL (ref 32.0–36.0)
MCV: 93.2 fL (ref 80.0–100.0)
PLATELETS: 126 10*3/uL — AB (ref 150–440)
RBC: 3.91 MIL/uL — ABNORMAL LOW (ref 4.40–5.90)
RDW: 12.6 % (ref 11.5–14.5)
WBC: 4.3 10*3/uL (ref 3.8–10.6)

## 2017-10-14 LAB — HEMOGLOBIN A1C
HEMOGLOBIN A1C: 7 % — AB (ref 4.8–5.6)
Mean Plasma Glucose: 154.2 mg/dL

## 2017-10-14 SURGERY — EGD (ESOPHAGOGASTRODUODENOSCOPY)
Anesthesia: General

## 2017-10-14 MED ORDER — PROPOFOL 500 MG/50ML IV EMUL
INTRAVENOUS | Status: DC | PRN
  Administered 2017-10-14: 120 ug/kg/min via INTRAVENOUS

## 2017-10-14 MED ORDER — MIDAZOLAM HCL 5 MG/5ML IJ SOLN
INTRAMUSCULAR | Status: DC | PRN
  Administered 2017-10-14: 2 mg via INTRAVENOUS

## 2017-10-14 MED ORDER — MORPHINE SULFATE (PF) 2 MG/ML IV SOLN
2.0000 mg | INTRAVENOUS | Status: DC | PRN
  Administered 2017-10-14: 2 mg via INTRAVENOUS

## 2017-10-14 MED ORDER — PROPOFOL 500 MG/50ML IV EMUL
INTRAVENOUS | Status: AC
Start: 2017-10-14 — End: ?
  Filled 2017-10-14: qty 50

## 2017-10-14 MED ORDER — MIDAZOLAM HCL 2 MG/2ML IJ SOLN
INTRAMUSCULAR | Status: AC
  Filled 2017-10-14: qty 2

## 2017-10-14 MED ORDER — PROPOFOL 10 MG/ML IV BOLUS
INTRAVENOUS | Status: DC | PRN
  Administered 2017-10-14: 30 mg via INTRAVENOUS
  Administered 2017-10-14: 70 mg via INTRAVENOUS

## 2017-10-14 MED ORDER — LIDOCAINE HCL (PF) 2 % IJ SOLN
INTRAMUSCULAR | Status: AC
Start: 2017-10-14 — End: ?
  Filled 2017-10-14: qty 10

## 2017-10-14 MED ORDER — MORPHINE SULFATE (PF) 2 MG/ML IV SOLN
INTRAVENOUS | Status: DC
Start: 2017-10-14 — End: 2017-10-14
  Filled 2017-10-14: qty 1

## 2017-10-14 MED ORDER — GLYCOPYRROLATE 0.2 MG/ML IJ SOLN
INTRAMUSCULAR | Status: DC | PRN
  Administered 2017-10-14: 0.2 mg via INTRAVENOUS

## 2017-10-14 MED ORDER — OMEPRAZOLE 40 MG PO CPDR: 40.0000 mg | DELAYED_RELEASE_CAPSULE | Freq: Two times a day (BID) | ORAL | 1 refills | Status: DC

## 2017-10-14 MED ORDER — GLYCOPYRROLATE 0.2 MG/ML IJ SOLN
INTRAMUSCULAR | Status: AC
  Filled 2017-10-14: qty 1

## 2017-10-14 MED ORDER — LIDOCAINE 2% (20 MG/ML) 5 ML SYRINGE
INTRAMUSCULAR | Status: DC | PRN
  Administered 2017-10-14: 25 mg via INTRAVENOUS

## 2017-10-14 NOTE — Anesthesia Postprocedure Evaluation (Signed)
Anesthesia Post Note  Patient: DELRICK DEHART  Procedure(s) Performed: ESOPHAGOGASTRODUODENOSCOPY (EGD) (N/A )  Patient location during evaluation: Endoscopy Anesthesia Type: General Level of consciousness: awake and alert Pain management: pain level controlled Vital Signs Assessment: post-procedure vital signs reviewed and stable Respiratory status: spontaneous breathing, nonlabored ventilation, respiratory function stable and patient connected to nasal cannula oxygen Cardiovascular status: blood pressure returned to baseline and stable Postop Assessment: no apparent nausea or vomiting Anesthetic complications: no     Last Vitals:  Vitals:   10/14/17 1056 10/14/17 1126  BP: (!) 140/94 140/90  Pulse: 73 70  Resp: 17 18  Temp:  36.6 C  SpO2: 100% 98%    Last Pain:  Vitals:   10/14/17 1126  TempSrc: Oral  PainSc:                  Martha Clan

## 2017-10-14 NOTE — Discharge Summary (Signed)
[facility] NAME: Xavier Hebert    MR#:  [id number]  DATE OF BIRTH:  10-May-1945  DATE OF ADMISSION:  10/13/2017 ADMITTING PHYSICIAN: Gladstone Lighter, MD  DATE OF DISCHARGE: 10/14/2017  PRIMARY CARE PHYSICIAN: Perrin Maltese, MD    ADMISSION DIAGNOSIS:  History of esophageal stricture [Z87.19] Chest pain, unspecified type [R07.9]  DISCHARGE DIAGNOSIS:  Active Problems:   Dysphagia   SECONDARY DIAGNOSIS:   Past Medical History:  Diagnosis Date  . Anxiety   . Arthritis   . Chronic pain syndrome   . CKD (chronic kidney disease) stage 3, GFR 30-59 ml/min (HCC)   . Depression   . Diabetes mellitus without complication ([facility])    Type II  . GERD (gastroesophageal reflux disease)   . Headache    Migraine- headache  . Hyperlipidemia   . Hypertension   . Kidney stones   . Peptic ulcer     HOSPITAL COURSE:   72 year old male with past medical history of essential hypertension, hyperlipidemia, chronic back pain, diabetes, osteoarthritis, anxiety, history of peptic ulcer disease, history of a previous esophageal stricture presented to the hospital due to chest pain and dysphasia.  1.  Chest pain/dysphagia-this was secondary to underlying peptic ulcer disease/esophageal stricture.  Patient has had this in the past. - He was admitted to the hospital started on IV Protonix, a gastroenterology consult was obtained and patient underwent an upper GI endoscopy day after admission which showed a benign esophageal stricture which was dilated.  Postprocedure patient's diet has been advanced and he is tolerating it without any further symptoms. - Patient will be discharged on oral omeprazole twice daily with follow-up with gastroenterology in the next 2 weeks.  GI plans on doing a repeat endoscopy with dilatation in 2 more weeks.  2.  Diabetes type 2 without complication- patient's blood sugars remained stable while in the hospital on sliding  scale insulin but he will resume his home dose insulin upon discharge.  3.  Essential hypertension-patient will continue his lisinopril, hydrochlorothiazide, Norvasc.  4.  History of gout-patient will continue his allopurinol, Colchicine.  5.  Chronic back pain-patient will continue his oxycodone, Robaxin.  6. Hx of Hyperlipidemia - cont. Crestor.    DISCHARGE CONDITIONS:   Stable.   CONSULTS OBTAINED:  Treatment Team:  Lin Landsman, MD  DRUG ALLERGIES:   Allergies  Allergen Reactions  . Aspirin Other (See Comments)    Stomach pain  . Ibuprofen Other (See Comments)    Stomach pain  . Ioxaglate Other (See Comments)    Breathing difficulty and chest pain  . Latex Itching  . Ivp Dye [Iodinated Diagnostic Agents] Other (See Comments) and Rash    Breathing difficulty and chest pain    DISCHARGE MEDICATIONS:   Allergies as of 10/14/2017      Reactions   Aspirin Other (See Comments)   Stomach pain   Ibuprofen Other (See Comments)   Stomach pain   Ioxaglate Other (See Comments)   Breathing difficulty and chest pain   Latex Itching   Ivp Dye [iodinated Diagnostic Agents] Other (See Comments), Rash   Breathing difficulty and chest pain      Medication List    STOP taking these medications   pantoprazole 40 MG tablet Commonly known as:  PROTONIX     TAKE these medications   albuterol 108 (90 Base) MCG/ACT inhaler Commonly known as:  PROVENTIL HFA;VENTOLIN HFA Inhale 1 to 2 puffs into the  lungs every 4 to 6 hours as needed for shortness of breath/wheezing   allopurinol 100 MG tablet Commonly known as:  ZYLOPRIM Take 100 mg by mouth daily.   amLODipine 5 MG tablet Commonly known as:  NORVASC Take 5 mg by mouth daily.   BASAGLAR KWIKPEN 100 UNIT/ML Sopn Inject 5-20 Units into the skin daily at 10 pm.   bifidobacterium infantis capsule Take 1 capsule by mouth daily.   butalbital-acetaminophen-caffeine 50-325-40 MG tablet Commonly known as:  FIORICET,  ESGIC Take 2 tablets by mouth at start of headache and take 1 additional tablet by mouth after 2 hours if needed - max 3 tablets daily   cholecalciferol 1000 units tablet Commonly known as:  VITAMIN D Take 1,000 Units by mouth daily.   clobetasol cream 0.05 % Commonly known as:  TEMOVATE Apply to affected area(s) 2 to 3 times weekly as needed   Coconut Oil 1000 MG Caps Take 2,000 mg by mouth 2 (two) times daily.   colchicine 0.6 MG tablet Take 1 tablet (0.6 mg total) by mouth daily.   CRESTOR 10 MG tablet Generic drug:  rosuvastatin Take 10 mg by mouth at bedtime.   flunisolide 25 MCG/ACT (0.025%) Soln Commonly known as:  NASALIDE Place 2 sprays into the nose 2 (two) times daily.   fluticasone 50 MCG/ACT nasal spray Commonly known as:  FLONASE Place 1 spray into both nostrils 2 (two) times daily.   hydrochlorothiazide 25 MG tablet Commonly known as:  HYDRODIURIL Take 25 mg by mouth daily.   HYDROPHILIC EX Apply 1 application topically 2 (two) times daily as needed (skin irritation).   hydrOXYzine 50 MG tablet Commonly known as:  ATARAX/VISTARIL Take 1 tablet by mouth every 6 (six) hours as needed for itching.   lisinopril 40 MG tablet Commonly known as:  PRINIVIL,ZESTRIL Take 20 mg by mouth daily.   methocarbamol 750 MG tablet Commonly known as:  ROBAXIN Take 750 mg by mouth 4 (four) times daily.   multivitamin tablet Take 1 tablet by mouth daily.   NONFORMULARY OR COMPOUNDED ITEM See pharmacy note What changed:    how much to take  how to take this  when to take this  additional instructions   NOVOLOG FLEXPEN 100 UNIT/ML FlexPen Generic drug:  insulin aspart Inject at mealtimes as needed for glucose control   omeprazole 40 MG capsule Commonly known as:  PRILOSEC Take 1 capsule (40 mg total) by mouth 2 (two) times daily.   oxyCODONE 15 MG immediate release tablet Commonly known as:  ROXICODONE Take 15 mg by mouth every 6 (six) hours.   Saw  Palmetto 450 MG Caps Take 2 capsules by mouth daily.   Testosterone 30 MG/ACT Soln Apply 1 pump transdermal under each arm daily   VIAGRA PO Take by mouth.         DISCHARGE INSTRUCTIONS:   DIET:  Cardiac diet and Diabetic diet  DISCHARGE CONDITION:  Stable  ACTIVITY:  Activity as tolerated  OXYGEN:  Home Oxygen: No.   Oxygen Delivery: room air  DISCHARGE LOCATION:  home   If you experience worsening of your admission symptoms, develop shortness of breath, life threatening emergency, suicidal or homicidal thoughts you must seek medical attention immediately by calling 911 or calling your MD immediately  if symptoms less severe.  You Must read complete instructions/literature along with all the possible adverse reactions/side effects for all the Medicines you take and that have been prescribed to you. Take any new Medicines after you  have completely understood and accpet all the possible adverse reactions/side effects.   Please note  You were cared for by a hospitalist during your hospital stay. If you have any questions about your discharge medications or the care you received while you were in the hospital after you are discharged, you can call the unit and asked to speak with the hospitalist on call if the hospitalist that took care of you is not available. Once you are discharged, your primary care physician will handle any further medical issues. Please note that NO REFILLS for any discharge medications will be authorized once you are discharged, as it is imperative that you return to your primary care physician (or establish a relationship with a primary care physician if you do not have one) for your aftercare needs so that they can reassess your need for medications and monitor your lab values.     Today   No complaints presently. S/p EGD with dilatation today.  Will d/c home today after tolerating soft diet.   VITAL SIGNS:  Blood pressure 140/90, pulse 70,  temperature 97.8 F (36.6 C), temperature source Oral, resp. rate 18, height 6' (1.829 m), weight 95 kg (209 lb 8 oz), SpO2 98 %.  I/O:    Intake/Output Summary (Last 24 hours) at 10/14/2017 1439 Last data filed at 10/14/2017 1405 Gross per 24 hour  Intake 652 ml  Output 1370 ml  Net -718 ml    PHYSICAL EXAMINATION:  GENERAL:  72 y.o.-year-old patient lying in the bed with no acute distress.  EYES: Pupils equal, round, reactive to light and accommodation. No scleral icterus. Extraocular muscles intact.  HEENT: Head atraumatic, normocephalic. Oropharynx and nasopharynx clear.  NECK:  Supple, no jugular venous distention. No thyroid enlargement, no tenderness.  LUNGS: Normal breath sounds bilaterally, no wheezing, rales,rhonchi. No use of accessory muscles of respiration.  CARDIOVASCULAR: S1, S2 normal. No murmurs, rubs, or gallops.  ABDOMEN: Soft, non-tender, non-distended. Bowel sounds present. No organomegaly or mass.  EXTREMITIES: No pedal edema, cyanosis, or clubbing.  NEUROLOGIC: Cranial nerves II through XII are intact. No focal motor or sensory defecits b/l.  PSYCHIATRIC: The patient is alert and oriented x 3.  SKIN: No obvious rash, lesion, or ulcer.   DATA REVIEW:   CBC Recent Labs  Lab 10/14/17 0553  WBC 4.3  HGB 12.6*  HCT 36.4*  PLT 126*    Chemistries  Recent Labs  Lab 10/13/17 1203 10/14/17 0553  NA 140 138  K 4.2 3.8  CL 105 106  CO2 30 28  GLUCOSE 149* 110*  BUN 40* 33*  CREATININE 1.98* 1.71*  CALCIUM 9.2 8.0*  AST 22  --   ALT 19  --   ALKPHOS 104  --   BILITOT 1.0  --     Cardiac Enzymes Recent Labs  Lab 10/13/17 1203  TROPONINI <0.03    Microbiology Results  Results for orders placed or performed during the hospital encounter of 11/24/14  Surgical pcr screen     Status: None   Collection Time: 11/24/14  3:37 PM  Result Value Ref Range Status   MRSA, PCR NEGATIVE NEGATIVE Final   Staphylococcus aureus NEGATIVE NEGATIVE Final     Comment:        The Xpert SA Assay (FDA approved for NASAL specimens in patients over 14 years of age), is one component of a comprehensive surveillance program.  Test performance has been validated by [facility] for patients greater than or equal to 1  year old. It is not intended to diagnose infection nor to guide or monitor treatment.     RADIOLOGY:  Dg Chest 2 View  Result Date: 10/13/2017 CLINICAL DATA:  Chest pain EXAM: CHEST - 2 VIEW COMPARISON:  11/24/2014 FINDINGS: Cardiac shadows within normal limits. The lungs are well aerated bilaterally. No focal infiltrate or sizable effusion is seen. No acute bony abnormality is noted. Postsurgical changes in the right shoulder are noted. IMPRESSION: No active cardiopulmonary disease. Electronically Signed   By: Inez Catalina M.D.   On: 10/13/2017 12:41      Management plans discussed with the patient, family and they are in agreement.  CODE STATUS:     Code Status Orders  (From admission, onward)        Start     Ordered   10/13/17 1623  Full code  Continuous     10/13/17 1622    Code Status History    Date Active Date Inactive Code Status Order ID Comments User Context   12/01/2014 1643 12/03/2014 1543 Full Code [id number]  Meredith Pel, MD Inpatient    Advance Directive Documentation     Most Recent Value  Type of Advance Directive  Living will  Pre-existing out of facility DNR order (yellow form or pink MOST form)  -  "MOST" Form in Place?  -      TOTAL TIME TAKING CARE OF THIS PATIENT: 40 minutes.    Henreitta Leber M.D on 10/14/2017 at 2:39 PM  Between 7am to 6pm - Pager - [phone]  After 6pm go to [email or web address] - Proofreader  [facility]  Office  [phone]  CC: Primary care physician; Perrin Maltese, MD

## 2017-10-14 NOTE — Transfer of Care (Signed)
Immediate Anesthesia Transfer of Care Note  Patient: Xavier Hebert  Procedure(s) Performed: ESOPHAGOGASTRODUODENOSCOPY (EGD) (N/A )  Patient Location: Endoscopy Unit  Anesthesia Type:General  Level of Consciousness: awake and alert   Airway & Oxygen Therapy: Patient Spontanous Breathing and Patient connected to nasal cannula oxygen  Post-op Assessment: Report given to RN and Post -op Vital signs reviewed and stable  Post vital signs: Reviewed  Last Vitals:  Vitals Value Taken Time  BP 106/63 10/14/2017 10:27 AM  Temp 36.1 C 10/14/2017 10:27 AM  Pulse 65 10/14/2017 10:27 AM  Resp 16 10/14/2017 10:27 AM  SpO2 98 % 10/14/2017 10:27 AM    Last Pain:  Vitals:   10/14/17 0956  TempSrc:   PainSc: 2          Complications: No apparent anesthesia complications

## 2017-10-14 NOTE — Progress Notes (Signed)
EGD post procedure note:  Benign appearing tight esophageal stricture s/p dilation with balloon to 22mm Rest of the exam was normal  Recs:  Chopped food, mechanical soft Switch from protonix to omeprazole 40mg  BID 30 min before meals Repeat EGD in 2weeks F/u in GI clinic in 4weeks Tight control of diabetes Ok to discharge home today   [name], MD [place]  [place], [place] [address]  Main: [phone]  Fax: [phone] Pager: [phone]

## 2017-10-14 NOTE — Op Note (Signed)
[facility] Patient Name: Xavier Hebert Procedure Date: 10/14/2017 9:21 AM MRN: [id number] Account #: [id number] Date of Birth: 04/03/1945 Admit Type: Inpatient Age: 72 Room: [facility] ENDO ROOM 4 Gender: Male Note Status: Finalized Procedure:            Upper GI endoscopy Indications:          Esophageal dysphagia Providers:            Lin Landsman MD, MD Medicines:            Monitored Anesthesia Care Complications:        No immediate complications. Estimated blood loss: None. Procedure:            Pre-Anesthesia Assessment:                       - Prior to the procedure, a History and Physical was                        performed, and patient medications and allergies were                        reviewed. The patient is competent. The risks and                        benefits of the procedure and the sedation options and                        risks were discussed with the patient. All questions                        were answered and informed consent was obtained.                        Patient identification and proposed procedure were                        verified by the physician, the nurse, the                        anesthesiologist, the anesthetist and the technician in                        the pre-procedure area in the procedure room in the                        endoscopy suite. Mental Status Examination: alert and                        oriented. Airway Examination: normal oropharyngeal                        airway and neck mobility. Respiratory Examination:                        clear to auscultation. CV Examination: normal.                        Prophylactic Antibiotics: The patient does not require  prophylactic antibiotics. Prior Anticoagulants: The                        patient has taken no previous anticoagulant or                        antiplatelet agents. ASA Grade Assessment: III - A                patient with severe systemic disease. After reviewing                        the risks and benefits, the patient was deemed in                        satisfactory condition to undergo the procedure. The                        anesthesia plan was to use monitored anesthesia care                        (MAC). Immediately prior to administration of                        medications, the patient was re-assessed for adequacy                        to receive sedatives. The heart rate, respiratory rate,                        oxygen saturations, blood pressure, adequacy of                        pulmonary ventilation, and response to care were                        monitored throughout the procedure. The physical status                        of the patient was re-assessed after the procedure.                       After obtaining informed consent, the endoscope was                        passed under direct vision. Throughout the procedure,                        the patient's blood pressure, pulse, and oxygen                        saturations were monitored continuously. The Endoscope                        was introduced through the mouth, and advanced to the                        second part of duodenum. The upper GI endoscopy was                        accomplished without difficulty. The  patient tolerated                        the procedure well. Findings:      One benign-appearing, intrinsic severe (stenosis; an endoscope cannot       pass) stenosis was found at the gastroesophageal junction. The stenosis       was traversed after dilation. A TTS dilator was passed through the       scope. Dilation with an 10-26-08 mm balloon and a 12-29-10 mm balloon       dilator was performed to 12 mm. The dilation site was examined following       endoscope reinsertion and showed moderate improvement in luminal       narrowing. Estimated blood loss: none.      The entire examined  stomach was normal.      The cardia and gastric fundus were normal on retroflexion.      The duodenal bulb and second portion of the duodenum were normal. Impression:           - Benign-appearing esophageal stenosis. Dilated.                       - Normal stomach.                       - Normal duodenal bulb and second portion of the                        duodenum.                       - No specimens collected. Recommendation:       - Return patient to hospital ward for possible                        discharge same day.                       - Chopped diet and mechanical soft diet today.                       - Continue present medications.                       - Follow an antireflux regimen.                       - Use Prilosec (omeprazole) 40 mg PO BID.                       - Repeat upper endoscopy in 2 weeks for retreatment.                       - Return to my office in 4 weeks. Procedure Code(s):    --- Professional ---                       [phone], Esophagogastroduodenoscopy, flexible, transoral;                        with transendoscopic balloon dilation of esophagus                        (  less than 30 mm diameter) Diagnosis Code(s):    --- Professional ---                       K22.2, Esophageal obstruction                       R13.14, Dysphagia, pharyngoesophageal phase CPT copyright 2017 American Medical Association. All rights reserved. The codes documented in this report are preliminary and upon coder review may  be revised to meet current compliance requirements. Dr. Ulyess Mort Lin Landsman MD, MD 10/14/2017 10:25:50 AM This report has been signed electronically. Number of Addenda: 0 Note Initiated On: 10/14/2017 9:21 AM      [facility]

## 2017-10-14 NOTE — Anesthesia Post-op Follow-up Note (Signed)
Anesthesia QCDR form completed.        

## 2017-10-15 ENCOUNTER — Encounter: Payer: Self-pay | Admitting: Gastroenterology

## 2017-10-15 ENCOUNTER — Other Ambulatory Visit: Payer: Self-pay

## 2017-10-15 DIAGNOSIS — Z1211 Encounter for screening for malignant neoplasm of colon: Secondary | ICD-10-CM

## 2017-10-15 DIAGNOSIS — K222 Esophageal obstruction: Secondary | ICD-10-CM

## 2017-10-15 LAB — HIV ANTIBODY (ROUTINE TESTING W REFLEX): HIV Screen 4th Generation wRfx: NONREACTIVE

## 2017-10-19 ENCOUNTER — Telehealth: Payer: Self-pay | Admitting: Gastroenterology

## 2017-10-19 NOTE — Telephone Encounter (Signed)
Patients wife called to cancel his colonoscopy. She stated he just had one at [facility]. I called [facility]

## 2017-10-22 ENCOUNTER — Telehealth: Payer: Self-pay | Admitting: Gastroenterology

## 2017-10-22 ENCOUNTER — Other Ambulatory Visit: Payer: Self-pay

## 2017-10-22 DIAGNOSIS — K222 Esophageal obstruction: Secondary | ICD-10-CM

## 2017-10-22 NOTE — Telephone Encounter (Signed)
Patient left voicemail 8.3.19 stating he had received letter for colonoscopy instructions. Patient states he had colon done with [facility] and thought he was only scheduled for endo. Patient requesting cb to clarify.

## 2017-10-23 NOTE — Telephone Encounter (Signed)
Pt colonoscopy has been cancelled and EGD has been rescheduled for 10/25/17, pt has been notified and verbalized understanding

## 2017-10-24 ENCOUNTER — Encounter: Payer: Self-pay | Admitting: *Deleted

## 2017-10-25 ENCOUNTER — Ambulatory Visit
Admission: RE | Admit: 2017-10-25 | Discharge: 2017-10-25 | Disposition: A | Discharge status: Home / Self Care | Payer: Medicare Other | Source: Ambulatory Visit | Attending: Gastroenterology | Admitting: Gastroenterology

## 2017-10-25 ENCOUNTER — Ambulatory Visit: Payer: Medicare Other | Admitting: Anesthesiology

## 2017-10-25 ENCOUNTER — Encounter: Admission: RE | Payer: Self-pay | Source: Ambulatory Visit

## 2017-10-25 ENCOUNTER — Other Ambulatory Visit: Payer: Self-pay

## 2017-10-25 ENCOUNTER — Encounter: Payer: Self-pay | Admitting: *Deleted

## 2017-10-25 ENCOUNTER — Ambulatory Visit: Admission: RE | Admit: 2017-10-25 | Payer: Medicare Other | Source: Ambulatory Visit | Admitting: Gastroenterology

## 2017-10-25 ENCOUNTER — Encounter
Admission: RE | Disposition: A | Discharge status: Home / Self Care | Payer: Self-pay | Source: Ambulatory Visit | Attending: Gastroenterology

## 2017-10-25 DIAGNOSIS — R1310 Dysphagia, unspecified: Secondary | ICD-10-CM | POA: Diagnosis present

## 2017-10-25 DIAGNOSIS — F329 Major depressive disorder, single episode, unspecified: Secondary | ICD-10-CM | POA: Diagnosis not present

## 2017-10-25 DIAGNOSIS — E1122 Type 2 diabetes mellitus with diabetic chronic kidney disease: Secondary | ICD-10-CM | POA: Diagnosis not present

## 2017-10-25 DIAGNOSIS — I129 Hypertensive chronic kidney disease with stage 1 through stage 4 chronic kidney disease, or unspecified chronic kidney disease: Secondary | ICD-10-CM | POA: Insufficient documentation

## 2017-10-25 DIAGNOSIS — R1314 Dysphagia, pharyngoesophageal phase: Secondary | ICD-10-CM | POA: Diagnosis not present

## 2017-10-25 DIAGNOSIS — K3189 Other diseases of stomach and duodenum: Secondary | ICD-10-CM

## 2017-10-25 DIAGNOSIS — Z794 Long term (current) use of insulin: Secondary | ICD-10-CM | POA: Diagnosis not present

## 2017-10-25 DIAGNOSIS — K222 Esophageal obstruction: Secondary | ICD-10-CM

## 2017-10-25 DIAGNOSIS — N183 Chronic kidney disease, stage 3 (moderate): Secondary | ICD-10-CM | POA: Insufficient documentation

## 2017-10-25 DIAGNOSIS — Z96653 Presence of artificial knee joint, bilateral: Secondary | ICD-10-CM | POA: Diagnosis not present

## 2017-10-25 DIAGNOSIS — Z96611 Presence of right artificial shoulder joint: Secondary | ICD-10-CM | POA: Diagnosis not present

## 2017-10-25 DIAGNOSIS — F419 Anxiety disorder, unspecified: Secondary | ICD-10-CM | POA: Diagnosis not present

## 2017-10-25 DIAGNOSIS — E785 Hyperlipidemia, unspecified: Secondary | ICD-10-CM | POA: Insufficient documentation

## 2017-10-25 HISTORY — PX: ESOPHAGOGASTRODUODENOSCOPY (EGD) WITH PROPOFOL: SHX5813

## 2017-10-25 SURGERY — ESOPHAGOGASTRODUODENOSCOPY (EGD) WITH PROPOFOL
Anesthesia: General

## 2017-10-25 SURGERY — COLONOSCOPY WITH PROPOFOL
Anesthesia: General

## 2017-10-25 MED ORDER — MIDAZOLAM HCL 2 MG/2ML IJ SOLN
INTRAMUSCULAR | Status: DC | PRN
  Administered 2017-10-25: 2 mg via INTRAVENOUS

## 2017-10-25 MED ORDER — SODIUM CHLORIDE 0.9 % IV SOLN
INTRAVENOUS | Status: DC
  Administered 2017-10-25: 11:00:00 via INTRAVENOUS

## 2017-10-25 MED ORDER — OMEPRAZOLE 40 MG PO CPDR: 40.0000 mg | DELAYED_RELEASE_CAPSULE | Freq: Every day | ORAL | 1 refills | Status: DC

## 2017-10-25 MED ORDER — FENTANYL CITRATE (PF) 100 MCG/2ML IJ SOLN
INTRAMUSCULAR | Status: DC | PRN
  Administered 2017-10-25 (×2): 50 ug via INTRAVENOUS

## 2017-10-25 MED ORDER — PROPOFOL 500 MG/50ML IV EMUL
INTRAVENOUS | Status: DC | PRN
  Administered 2017-10-25: 120 ug/kg/min via INTRAVENOUS

## 2017-10-25 NOTE — H&P (Signed)
[name], MD [address]  [place]  [place], [place] [address]  Main: [phone]  Fax: [phone] Pager: [phone]  Primary Care Physician:  Perrin Maltese, MD Primary Gastroenterologist:  Dr. [name]  Pre-Procedure History & Physical: HPI:  Xavier Hebert is a 72 y.o. male is here for an endoscopy.   Past Medical History:  Diagnosis Date  . Anxiety   . Arthritis   . Chronic pain syndrome   . CKD (chronic kidney disease) stage 3, GFR 30-59 ml/min (HCC)   . Depression   . Diabetes mellitus without complication ([place])    Type II  . GERD (gastroesophageal reflux disease)   . Headache    Migraine- headache  . Hyperlipidemia   . Hypertension   . Kidney stones   . Peptic ulcer     Past Surgical History:  Procedure Laterality Date  . ANTERIOR CERVICAL DECOMP/DISCECTOMY FUSION  2009  . CYSTOSCOPY     x3- 2 times for stone removal   . ESOPHAGOGASTRODUODENOSCOPY N/A 10/14/2017   Procedure: ESOPHAGOGASTRODUODENOSCOPY (EGD);  Surgeon: Lin Landsman, MD;  Location: [facility] ENDOSCOPY;  Service: Gastroenterology;  Laterality: N/A;  . JOINT REPLACEMENT     bil. knees , shoulder rt, and neck   . REVERSE SHOULDER ARTHROPLASTY Right 12/01/2014  . REVERSE SHOULDER ARTHROPLASTY Right 12/01/2014   Procedure: REVERSE SHOULDER ARTHROPLASTY;  Surgeon: Meredith Pel, MD;  Location: [facility];  Service: Orthopedics;  Laterality: Right;  . SHOULDER ARTHROSCOPY W/ ROTATOR CUFF REPAIR Right 06/10/2014   "failed"  . SHOULDER OPEN ROTATOR CUFF REPAIR Left ~ 2008  . 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 TRIGGER FINGER RELEASE Right 2001  . URETERAL STENT PLACEMENT      Prior to Admission medications   Medication Sig Start Date End Date Taking? Authorizing Provider  albuterol (PROVENTIL HFA;VENTOLIN HFA) 108 (90 Base) MCG/ACT inhaler Inhale 1 to 2 puffs into the lungs every 4 to 6 hours as needed for shortness of breath/wheezing 10/01/17  Yes  [provider]  allopurinol (ZYLOPRIM) 100 MG tablet Take 100 mg by mouth daily.  07/11/17  Yes [provider]  amLODipine (NORVASC) 5 MG tablet Take 5 mg by mouth daily.   Yes [provider]  butalbital-acetaminophen-caffeine (FIORICET, ESGIC) 50-325-40 MG per tablet Take 2 tablets by mouth at start of headache and take 1 additional tablet by mouth after 2 hours if needed - max 3 tablets daily   Yes [provider]  cholecalciferol (VITAMIN D) 1000 units tablet Take 1,000 Units by mouth daily.   Yes [provider]  clobetasol cream (TEMOVATE) 0.05 % Apply to affected area(s) 2 to 3 times weekly as needed   Yes [provider]  Coconut Oil 1000 MG CAPS Take 2,000 mg by mouth 2 (two) times daily.   Yes [provider]  colchicine 0.6 MG tablet Take 1 tablet (0.6 mg total) by mouth daily. 09/18/17  Yes Edrick Kins, DPM  flunisolide (NASALIDE) 25 MCG/ACT (0.025%) SOLN Place 2 sprays into the nose 2 (two) times daily.   Yes [provider]  hydrochlorothiazide (HYDRODIURIL) 25 MG tablet Take 25 mg by mouth daily.  08/22/17  Yes [provider]  HYDROPHILIC EX Apply 1 application topically 2 (two) times daily as needed (skin irritation).   Yes [provider]  insulin aspart (NOVOLOG FLEXPEN) 100 UNIT/ML FlexPen Inject at mealtimes as needed for glucose control   Yes [provider]  Insulin Glargine (BASAGLAR KWIKPEN) 100 UNIT/ML SOPN Inject 5-20 Units into the skin daily at 10 pm.  01/04/17  Yes [provider]  lisinopril (PRINIVIL,ZESTRIL) 40 MG tablet Take 20 mg by mouth daily.    Yes [provider]  methocarbamol (ROBAXIN) 750 MG tablet Take 750 mg by mouth 4 (four) times daily.   Yes [provider]  Multiple Vitamin (MULTIVITAMIN) tablet Take 1 tablet by mouth daily.   Yes [provider]  NONFORMULARY OR COMPOUNDED ITEM See pharmacy note Patient taking  differently: Apply 1 application topically daily. See pharmacy note 03/02/17  Yes Edrick Kins, DPM  omeprazole (PRILOSEC) 40 MG capsule Take 1 capsule (40 mg total) by mouth 2 (two) times daily. 10/14/17 12/13/17 Yes Sainani, Belia Heman, MD  oxyCODONE (ROXICODONE) 15 MG immediate release tablet Take 15 mg by mouth every 6 (six) hours.    Yes [provider]  rosuvastatin (CRESTOR) 10 MG tablet Take 10 mg by mouth at bedtime.    Yes [provider]  Saw Palmetto 450 MG CAPS Take 2 capsules by mouth daily.   Yes [provider]  Sildenafil Citrate (VIAGRA PO) Take by mouth.   Yes [provider]  Testosterone 30 MG/ACT SOLN Apply 1 pump transdermal under each arm daily   Yes [provider]  bifidobacterium infantis (ALIGN) capsule Take 1 capsule by mouth daily.     [provider]  fluticasone (FLONASE) 50 MCG/ACT nasal spray Place 1 spray into both nostrils 2 (two) times daily.    [provider]  hydrOXYzine (ATARAX/VISTARIL) 50 MG tablet Take 1 tablet by mouth every 6 (six) hours as needed for itching.  04/05/17   [provider]    Allergies as of 10/22/2017 - Review Complete 10/14/2017  Allergen Reaction Noted  . Aspirin Other (See Comments) 11/06/2013  . Ibuprofen Other (See Comments) 11/06/2013  . Ioxaglate Other (See Comments) 01/27/2015  . Latex Itching 11/20/2014  . Ivp dye [iodinated diagnostic agents] Other (See Comments) and Rash 11/06/2013    Family History  Problem Relation Age of Onset  . Dementia Mother   . Alcohol abuse Father     Social History   Socioeconomic History  . Marital status: Married    Spouse name: Not on file  . Number of children: Not on file  . Years of education: Not on file  . Highest education level: Not on file  Occupational History  . Not on file  Social Needs  . Financial resource strain: Not on file  . Food insecurity:    Worry: Not on file    Inability: Not on file    . Transportation needs:    Medical: Not on file    Non-medical: Not on file  Tobacco Use  . Smoking status: Never Smoker  . Smokeless tobacco: Never Used  Substance and Sexual Activity  . Alcohol use: No  . Drug use: No  . Sexual activity: Not Currently  Lifestyle  . Physical activity:    Days per week: Not on file    Minutes per session: Not on file  . Stress: Not on file  Relationships  . Social connections:    Talks on phone: Not on file    Gets together: Not on file    Attends religious service: Not on file    Active member of club or organization: Not on file    Attends meetings of clubs or organizations: Not on file    Relationship  status: Not on file  . Intimate partner violence:    Fear of current or ex partner: Not on file    Emotionally abused: Not on file    Physically abused: Not on file    Forced sexual activity: Not on file  Other Topics Concern  . Not on file  Social History Narrative   Lives at home with his wife. Independent at baseline.    Review of Systems: See HPI, otherwise negative ROS  Physical Exam: BP (!) 116/57   Pulse (!) 50   Temp (!) 97.2 F (36.2 C) (Tympanic)   Resp 20   Ht 6' (1.829 m)   Wt 93.4 kg   SpO2 100%   BMI 27.94 kg/m  General:   Alert,  pleasant and cooperative in NAD Head:  Normocephalic and atraumatic. Neck:  Supple; no masses or thyromegaly. Lungs:  Clear throughout to auscultation.    Heart:  Regular rate and rhythm. Abdomen:  Soft, nontender and nondistended. Normal bowel sounds, without guarding, and without rebound.   Neurologic:  Alert and  oriented x4;  grossly normal neurologically.  Impression/Plan: Xavier Hebert is here for an endoscopy to be performed for dysphagia, peptic stricture  Risks, benefits, limitations, and alternatives regarding  endoscopy have been reviewed with the patient.  Questions have been answered.  All parties agreeable.   Sherri Sear, MD  10/25/2017, 10:59 AM

## 2017-10-25 NOTE — Transfer of Care (Signed)
Immediate Anesthesia Transfer of Care Note  Patient: Xavier Hebert  Procedure(s) Performed: ESOPHAGOGASTRODUODENOSCOPY (EGD) WITH PROPOFOL (N/A )  Patient Location: PACU  Anesthesia Type:General  Level of Consciousness: awake and sedated  Airway & Oxygen Therapy: Patient Spontanous Breathing and Patient connected to nasal cannula oxygen  Post-op Assessment: Report given to RN and Post -op Vital signs reviewed and stable  Post vital signs: Reviewed and stable  Last Vitals:  Vitals Value Taken Time  BP    Temp    Pulse    Resp    SpO2      Last Pain:  Vitals:   10/25/17 1026  TempSrc: Tympanic  PainSc: 0-No pain         Complications: No apparent anesthesia complications

## 2017-10-25 NOTE — Anesthesia Procedure Notes (Signed)
Performed by: Cook-Martin, Caia Lofaro Pre-anesthesia Checklist: Patient identified, Emergency Drugs available, Suction available, Patient being monitored and Timeout performed Patient Re-evaluated:Patient Re-evaluated prior to induction Oxygen Delivery Method: Nasal cannula Preoxygenation: Pre-oxygenation with 100% oxygen Induction Type: IV induction Airway Equipment and Method: Bite block Placement Confirmation: positive ETCO2 and CO2 detector       

## 2017-10-25 NOTE — Op Note (Signed)
[facility] Gastroenterology Patient Name: Xavier Hebert Procedure Date: 10/25/2017 11:04 AM MRN: [id number] Account #: [id number] Date of Birth: [birth year] Admit Type: Outpatient Age: 72 Room: [facility] ENDO ROOM 4 Gender: Male Note Status: Finalized Procedure:            Upper GI endoscopy Indications:          Esophageal dysphagia Providers:            Lin Landsman MD, MD Referring MD:         Perrin Maltese, MD (Referring MD) Medicines:            Monitored Anesthesia Care Complications:        No immediate complications. Estimated blood loss:                        Minimal. Procedure:            Pre-Anesthesia Assessment:                       - Prior to the procedure, a History and Physical was                        performed, and patient medications and allergies were                        reviewed. The patient is competent. The risks and                        benefits of the procedure and the sedation options and                        risks were discussed with the patient. All questions                        were answered and informed consent was obtained.                        Patient identification and proposed procedure were                        verified by the physician, the nurse, the                        anesthesiologist, the anesthetist and the technician in                        the pre-procedure area in the procedure room in the                        endoscopy suite. Mental Status Examination: alert and                        oriented. Airway Examination: normal oropharyngeal                        airway and neck mobility. Respiratory Examination:                        clear to auscultation. CV Examination: normal.  Prophylactic Antibiotics: The patient does not require                        prophylactic antibiotics. Prior Anticoagulants: The                        patient has taken no previous anticoagulant or                         antiplatelet agents. ASA Grade Assessment: III - A                        patient with severe systemic disease. After reviewing                        the risks and benefits, the patient was deemed in                        satisfactory condition to undergo the procedure. The                        anesthesia plan was to use monitored anesthesia care                        (MAC). Immediately prior to administration of                        medications, the patient was re-assessed for adequacy                        to receive sedatives. The heart rate, respiratory rate,                        oxygen saturations, blood pressure, adequacy of                        pulmonary ventilation, and response to care were                        monitored throughout the procedure. The physical status                        of the patient was re-assessed after the procedure.                       After obtaining informed consent, the endoscope was                        passed under direct vision. Throughout the procedure,                        the patient's blood pressure, pulse, and oxygen                        saturations were monitored continuously. The Endoscope                        was introduced through the mouth, and advanced to the  second part of duodenum. The upper GI endoscopy was                        accomplished without difficulty. The patient tolerated                        the procedure well. Findings:      The duodenal bulb and second portion of the duodenum were normal.      Diffuse mildly erythematous mucosa without bleeding was found in the       gastric fundus and in the gastric body. Biopsies were taken with a cold       forceps for Helicobacter pylori testing.      The incisura, gastric antrum and pylorus were normal. Biopsies were       taken with a cold forceps for Helicobacter pylori testing.      The gastroesophageal  junction and examined esophagus were normal.       Biopsies were obtained from the proximal and distal esophagus with cold       forceps for histology of suspected eosinophilic esophagitis.      Esophagogastric landmarks were identified: the gastroesophageal junction       was found at 40 cm from the incisors. Impression:           - Normal duodenal bulb and second portion of the                        duodenum.                       - Erythematous mucosa in the gastric fundus and gastric                        body. Biopsied.                       - Normal incisura, antrum and pylorus. Biopsied.                       - Normal gastroesophageal junction and esophagus.                        Biopsied                       - Esophagogastric landmarks identified. Recommendation:       - Discharge patient to home (with spouse).                       - Diabetic (ADA) diet.                       - Continue present medications.                       - Decrease prilosec to once daily                       - Await pathology results.                       - Return to my office in 3 months. Procedure Code(s):    --- Professional ---  67619, Esophagogastroduodenoscopy, flexible, transoral;                        with biopsy, single or multiple Diagnosis Code(s):    --- Professional ---                       K31.89, Other diseases of stomach and duodenum                       R13.14, Dysphagia, pharyngoesophageal phase CPT copyright 2017 American Medical Association. All rights reserved. The codes documented in this report are preliminary and upon coder review may  be revised to meet current compliance requirements. Dr. Ulyess Mort Lin Landsman MD, MD 10/25/2017 11:32:14 AM This report has been signed electronically. Number of Addenda: 0 Note Initiated On: 10/25/2017 11:04 AM      [facility]

## 2017-10-25 NOTE — Progress Notes (Signed)
Blood sugar 135

## 2017-10-25 NOTE — Anesthesia Post-op Follow-up Note (Signed)
Anesthesia QCDR form completed.        

## 2017-10-25 NOTE — Anesthesia Preprocedure Evaluation (Signed)
Anesthesia Evaluation  Patient identified by MRN, date of birth, ID band Patient awake    Reviewed: Allergy & Precautions, H&P , NPO status , Patient's Chart, lab work & pertinent test results, reviewed documented beta blocker date and time   History of Anesthesia Complications Negative for: history of anesthetic complications  Airway Mallampati: III  TM Distance: >3 FB Neck ROM: full    Dental  (+) Partial Upper, Dental Advidsory Given   Pulmonary neg shortness of breath, sleep apnea , pneumonia, resolved, neg COPD, Recent URI , Residual Cough,           Cardiovascular Exercise Tolerance: Good hypertension, (-) angina+ CAD  (-) Past MI, (-) Cardiac Stents and (-) CABG (-) dysrhythmias (-) Valvular Problems/Murmurs     Neuro/Psych  Headaches, neg Seizures PSYCHIATRIC DISORDERS Anxiety Depression negative psych ROS   GI/Hepatic negative GI ROS, Neg liver ROS, PUD, GERD  ,  Endo/Other  diabetes  Renal/GU CRFRenal disease  negative genitourinary   Musculoskeletal  (+) Arthritis ,   Abdominal   Peds  Hematology negative hematology ROS (+)   Anesthesia Other Findings Past Medical History: No date: Anxiety No date: Arthritis No date: Chronic pain syndrome No date: CKD (chronic kidney disease) stage 3, GFR 30-59 ml/min (HCC) No date: Depression No date: 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 No date: GERD (gastroesophageal reflux disease) No date: Headache     Comment:  Migraine- headache No date: Hyperlipidemia No date: Hypertension No date: Kidney stones No date: Peptic ulcer   Reproductive/Obstetrics negative OB ROS                             Anesthesia Physical  Anesthesia Plan  ASA: III  Anesthesia Plan: General   Post-op Pain Management:    Induction: Intravenous  PONV Risk Score and Plan: 2 and Propofol infusion and TIVA  Airway Management  Planned: Nasal Cannula and Natural Airway  Additional Equipment:   Intra-op Plan:   Post-operative Plan:   Informed Consent: I have reviewed the patients History and Physical, chart, labs and discussed the procedure including the risks, benefits and alternatives for the proposed anesthesia with the patient or authorized representative who has indicated his/her understanding and acceptance.   Dental Advisory Given  Plan Discussed with: Anesthesiologist, CRNA and Surgeon  Anesthesia Plan Comments:         Anesthesia Quick Evaluation

## 2017-10-25 NOTE — Anesthesia Postprocedure Evaluation (Signed)
Anesthesia Post Note  Patient: Xavier Hebert  Procedure(s) Performed: ESOPHAGOGASTRODUODENOSCOPY (EGD) WITH PROPOFOL (N/A )  Patient location during evaluation: Endoscopy Anesthesia Type: General Level of consciousness: awake and alert Pain management: pain level controlled Vital Signs Assessment: post-procedure vital signs reviewed and stable Respiratory status: spontaneous breathing, nonlabored ventilation, respiratory function stable and patient connected to nasal cannula oxygen Cardiovascular status: blood pressure returned to baseline and stable Postop Assessment: no apparent nausea or vomiting Anesthetic complications: no     Last Vitals:  Vitals:   10/25/17 1200 10/25/17 1202  BP:  109/71  Pulse: 65 (!) 57  Resp: 17 17  Temp:    SpO2: 99% 100%    Last Pain:  Vitals:   10/25/17 1203  TempSrc:   PainSc: 0-No pain                 Martha Clan

## 2017-10-27 LAB — SURGICAL PATHOLOGY

## 2017-10-29 ENCOUNTER — Encounter: Payer: Self-pay | Admitting: Gastroenterology

## 2017-11-21 ENCOUNTER — Ambulatory Visit: Payer: Medicare Other | Admitting: Anesthesiology

## 2017-11-21 ENCOUNTER — Encounter
Admission: RE | Disposition: A | Discharge status: Home / Self Care | Payer: Self-pay | Source: Ambulatory Visit | Attending: Internal Medicine

## 2017-11-21 ENCOUNTER — Encounter: Payer: Self-pay | Admitting: Student

## 2017-11-21 ENCOUNTER — Ambulatory Visit
Admission: RE | Admit: 2017-11-21 | Discharge: 2017-11-21 | Disposition: A | Discharge status: Home / Self Care | Payer: Medicare Other | Source: Ambulatory Visit | Attending: Internal Medicine | Admitting: Internal Medicine

## 2017-11-21 DIAGNOSIS — K640 First degree hemorrhoids: Secondary | ICD-10-CM | POA: Insufficient documentation

## 2017-11-21 DIAGNOSIS — K621 Rectal polyp: Secondary | ICD-10-CM | POA: Insufficient documentation

## 2017-11-21 DIAGNOSIS — Z1211 Encounter for screening for malignant neoplasm of colon: Secondary | ICD-10-CM | POA: Diagnosis not present

## 2017-11-21 DIAGNOSIS — N183 Chronic kidney disease, stage 3 (moderate): Secondary | ICD-10-CM | POA: Insufficient documentation

## 2017-11-21 DIAGNOSIS — F419 Anxiety disorder, unspecified: Secondary | ICD-10-CM | POA: Diagnosis not present

## 2017-11-21 DIAGNOSIS — K219 Gastro-esophageal reflux disease without esophagitis: Secondary | ICD-10-CM | POA: Insufficient documentation

## 2017-11-21 DIAGNOSIS — K5730 Diverticulosis of large intestine without perforation or abscess without bleeding: Secondary | ICD-10-CM | POA: Insufficient documentation

## 2017-11-21 DIAGNOSIS — G4730 Sleep apnea, unspecified: Secondary | ICD-10-CM | POA: Insufficient documentation

## 2017-11-21 DIAGNOSIS — Z91040 Latex allergy status: Secondary | ICD-10-CM | POA: Diagnosis not present

## 2017-11-21 DIAGNOSIS — Z79899 Other long term (current) drug therapy: Secondary | ICD-10-CM | POA: Diagnosis not present

## 2017-11-21 DIAGNOSIS — I2510 Atherosclerotic heart disease of native coronary artery without angina pectoris: Secondary | ICD-10-CM | POA: Insufficient documentation

## 2017-11-21 DIAGNOSIS — E785 Hyperlipidemia, unspecified: Secondary | ICD-10-CM | POA: Diagnosis not present

## 2017-11-21 DIAGNOSIS — Z886 Allergy status to analgesic agent status: Secondary | ICD-10-CM | POA: Diagnosis not present

## 2017-11-21 DIAGNOSIS — Z888 Allergy status to other drugs, medicaments and biological substances status: Secondary | ICD-10-CM | POA: Insufficient documentation

## 2017-11-21 DIAGNOSIS — F329 Major depressive disorder, single episode, unspecified: Secondary | ICD-10-CM | POA: Insufficient documentation

## 2017-11-21 DIAGNOSIS — D122 Benign neoplasm of ascending colon: Secondary | ICD-10-CM | POA: Insufficient documentation

## 2017-11-21 DIAGNOSIS — M1990 Unspecified osteoarthritis, unspecified site: Secondary | ICD-10-CM | POA: Diagnosis not present

## 2017-11-21 DIAGNOSIS — Z8711 Personal history of peptic ulcer disease: Secondary | ICD-10-CM | POA: Diagnosis not present

## 2017-11-21 DIAGNOSIS — E1122 Type 2 diabetes mellitus with diabetic chronic kidney disease: Secondary | ICD-10-CM | POA: Insufficient documentation

## 2017-11-21 DIAGNOSIS — Z91041 Radiographic dye allergy status: Secondary | ICD-10-CM | POA: Diagnosis not present

## 2017-11-21 DIAGNOSIS — Z794 Long term (current) use of insulin: Secondary | ICD-10-CM | POA: Insufficient documentation

## 2017-11-21 DIAGNOSIS — Z981 Arthrodesis status: Secondary | ICD-10-CM | POA: Diagnosis not present

## 2017-11-21 DIAGNOSIS — I129 Hypertensive chronic kidney disease with stage 1 through stage 4 chronic kidney disease, or unspecified chronic kidney disease: Secondary | ICD-10-CM | POA: Insufficient documentation

## 2017-11-21 DIAGNOSIS — D123 Benign neoplasm of transverse colon: Secondary | ICD-10-CM | POA: Diagnosis not present

## 2017-11-21 HISTORY — DX: Sleep apnea, unspecified: G47.30

## 2017-11-21 HISTORY — PX: COLONOSCOPY WITH PROPOFOL: SHX5780

## 2017-11-21 LAB — GLUCOSE, CAPILLARY: GLUCOSE-CAPILLARY: 126 mg/dL — AB (ref 70–99)

## 2017-11-21 SURGERY — COLONOSCOPY WITH PROPOFOL
Anesthesia: General

## 2017-11-21 MED ORDER — PROPOFOL 500 MG/50ML IV EMUL
INTRAVENOUS | Status: AC
  Filled 2017-11-21: qty 100

## 2017-11-21 MED ORDER — PHENYLEPHRINE HCL 10 MG/ML IJ SOLN
INTRAMUSCULAR | Status: AC
  Filled 2017-11-21: qty 1

## 2017-11-21 MED ORDER — SUCCINYLCHOLINE CHLORIDE 20 MG/ML IJ SOLN
INTRAMUSCULAR | Status: AC
  Filled 2017-11-21: qty 1

## 2017-11-21 MED ORDER — LIDOCAINE HCL (PF) 2 % IJ SOLN
INTRAMUSCULAR | Status: AC
  Filled 2017-11-21: qty 10

## 2017-11-21 MED ORDER — SODIUM CHLORIDE 0.9 % IJ SOLN
INTRAMUSCULAR | Status: AC
  Filled 2017-11-21: qty 10

## 2017-11-21 MED ORDER — SODIUM CHLORIDE 0.9 % IV SOLN
INTRAVENOUS | Status: DC
  Administered 2017-11-21: 08:00:00 via INTRAVENOUS

## 2017-11-21 MED ORDER — PHENYLEPHRINE HCL 10 MG/ML IJ SOLN
INTRAMUSCULAR | Status: DC | PRN
  Administered 2017-11-21: 150 ug via INTRAVENOUS

## 2017-11-21 MED ORDER — LIDOCAINE HCL (CARDIAC) PF 100 MG/5ML IV SOSY
PREFILLED_SYRINGE | INTRAVENOUS | Status: DC | PRN
  Administered 2017-11-21: 35 mg via INTRAVENOUS

## 2017-11-21 MED ORDER — EPHEDRINE SULFATE 50 MG/ML IJ SOLN
INTRAMUSCULAR | Status: AC
  Filled 2017-11-21: qty 1

## 2017-11-21 MED ORDER — PROPOFOL 10 MG/ML IV BOLUS
INTRAVENOUS | Status: DC | PRN
  Administered 2017-11-21: 100 mg via INTRAVENOUS
  Administered 2017-11-21 (×2): 30 mg via INTRAVENOUS

## 2017-11-21 MED ORDER — PROPOFOL 500 MG/50ML IV EMUL
INTRAVENOUS | Status: DC | PRN
  Administered 2017-11-21: 150 ug/kg/min via INTRAVENOUS

## 2017-11-21 NOTE — Anesthesia Preprocedure Evaluation (Addendum)
Anesthesia Evaluation  Patient identified by MRN, date of birth, ID band Patient awake    Reviewed: Allergy & Precautions, H&P , NPO status , Patient's Chart, lab work & pertinent test results  Airway Mallampati: I  TM Distance: >3 FB Neck ROM: full    Dental  (+) Partial Upper   Pulmonary neg pulmonary ROS, sleep apnea ,           Cardiovascular hypertension, + CAD  negative cardio ROS       Neuro/Psych  Headaches, PSYCHIATRIC DISORDERS Anxiety Depression negative neurological ROS  negative psych ROS   GI/Hepatic negative GI ROS, Neg liver ROS, PUD, GERD  ,  Endo/Other  negative endocrine ROSdiabetes  Renal/GU CRFRenal diseasenegative Renal ROS  negative genitourinary   Musculoskeletal  (+) Arthritis ,   Abdominal   Peds  Hematology negative hematology ROS (+)   Anesthesia Other Findings Past Medical History: No date: Anxiety No date: Arthritis No date: Chronic pain syndrome No date: CKD (chronic kidney disease) stage 3, GFR 30-59 ml/min (HCC) No date: Coronary artery disease No date: Depression No date: 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 No date: GERD (gastroesophageal reflux disease) No date: Headache     Comment:  Migraine- headache No date: Hyperlipidemia No date: Hypertension No date: Kidney stones No date: Peptic ulcer No date: Sleep apnea  Past Surgical History: 2009: ANTERIOR CERVICAL DECOMP/DISCECTOMY FUSION No date: CYSTOSCOPY     Comment:  x3- 2 times for stone removal  10/14/2017: ESOPHAGOGASTRODUODENOSCOPY; N/A     Comment:  Procedure: ESOPHAGOGASTRODUODENOSCOPY (EGD);  Surgeon:               Lin Landsman, MD;  Location: [facility] ENDOSCOPY;                Service: Gastroenterology;  Laterality: N/A; 10/25/2017: ESOPHAGOGASTRODUODENOSCOPY (EGD) WITH PROPOFOL; N/A     Comment:  Procedure: ESOPHAGOGASTRODUODENOSCOPY (EGD) WITH               PROPOFOL;   Surgeon: Lin Landsman, MD;  Location:               [facility] ENDOSCOPY;  Service: Gastroenterology;  Laterality:               N/A; No date: JOINT REPLACEMENT     Comment:  bil. knees , shoulder rt, and neck  12/01/2014: REVERSE SHOULDER ARTHROPLASTY; Right 12/01/2014: REVERSE SHOULDER ARTHROPLASTY; Right     Comment:  Procedure: REVERSE SHOULDER ARTHROPLASTY;  Surgeon:               Meredith Pel, MD;  Location: [place];  Service:               Orthopedics;  Laterality: Right; 06/10/2014: SHOULDER ARTHROSCOPY W/ ROTATOR CUFF REPAIR; Right     Comment:  "failed" ~ 2008: [place]; Left 2005-2008: TOTAL KNEE ARTHROPLASTY; Bilateral     Comment:  "right-left" 2001: [place]; Right No date: URETERAL STENT PLACEMENT     Reproductive/Obstetrics negative OB ROS                            Anesthesia Physical Anesthesia Plan  ASA: III  Anesthesia Plan: General   Post-op Pain Management:    Induction:   PONV Risk Score and Plan: Propofol infusion and TIVA  Airway Management Planned: Natural Airway and Nasal Cannula  Additional Equipment:  Intra-op Plan:   Post-operative Plan:   Informed Consent: I have reviewed the patients History and Physical, chart, labs and discussed the procedure including the risks, benefits and alternatives for the proposed anesthesia with the patient or authorized representative who has indicated his/her understanding and acceptance.   Dental Advisory Given  Plan Discussed with: Anesthesiologist, CRNA and Surgeon  Anesthesia Plan Comments:         Anesthesia Quick Evaluation

## 2017-11-21 NOTE — Anesthesia Postprocedure Evaluation (Signed)
Anesthesia Post Note  Patient: Xavier Hebert  Procedure(s) Performed: COLONOSCOPY WITH PROPOFOL (N/A )  Patient location during evaluation: PACU Anesthesia Type: General Level of consciousness: awake and alert Pain management: pain level controlled Vital Signs Assessment: post-procedure vital signs reviewed and stable Respiratory status: spontaneous breathing, nonlabored ventilation and respiratory function stable Cardiovascular status: blood pressure returned to baseline and stable Postop Assessment: no apparent nausea or vomiting Anesthetic complications: no     Last Vitals:  Vitals:   11/21/17 0731 11/21/17 0830  BP: 119/84 97/74  Pulse: 86 76  Resp: 16 16  Temp: (!) 35.7 C (!) 36.2 C  SpO2: 98% 100%    Last Pain:  Vitals:   11/21/17 0830  TempSrc: Tympanic  PainSc:                  Durenda Hurt

## 2017-11-21 NOTE — Transfer of Care (Signed)
Immediate Anesthesia Transfer of Care Note  Patient: Xavier Hebert  Procedure(s) Performed: COLONOSCOPY WITH PROPOFOL (N/A )  Patient Location: PACU and Endoscopy Unit  Anesthesia Type:General  Level of Consciousness: awake  Airway & Oxygen Therapy: Patient Spontanous Breathing  Post-op Assessment: Report given to RN  Post vital signs: stable  Last Vitals:  Vitals Value Taken Time  BP 97/74 11/21/2017  8:30 AM  Temp 36.2 C 11/21/2017  8:30 AM  Pulse 76 11/21/2017  8:30 AM  Resp 16 11/21/2017  8:30 AM  SpO2 100 % 11/21/2017  8:30 AM    Last Pain:  Vitals:   11/21/17 0830  TempSrc: Tympanic  PainSc:       Patients Stated Pain Goal: 2 (37/85/88 5027)  Complications: No apparent anesthesia complications

## 2017-11-21 NOTE — Op Note (Signed)
[facility] Patient Name: Xavier Hebert Procedure Date: 11/21/2017 7:35 AM MRN: [id number] Account #: [id number] Date of Birth: [birth year] Admit Type: Outpatient Age: 72 Room: [facility] ENDO ROOM 1 Gender: Male Note Status: Finalized Procedure:            Colonoscopy Indications:          Screening for colorectal malignant neoplasm Providers:            Benay Pike. [name] MD, MD Referring MD:         Perrin Maltese, MD (Referring MD) Medicines:            Propofol per Anesthesia Complications:        No immediate complications. Procedure:            Pre-Anesthesia Assessment:                       - The risks and benefits of the procedure and the                        sedation options and risks were discussed with the                        patient. All questions were answered and informed                        consent was obtained.                       - Patient identification and proposed procedure were                        verified prior to the procedure by the nurse. The                        procedure was verified in the procedure room.                       - ASA Grade Assessment: III - A patient with severe                        systemic disease.                       - After reviewing the risks and benefits, the patient                        was deemed in satisfactory condition to undergo the                        procedure.                       After obtaining informed consent, the colonoscope was                        passed under direct vision. Throughout the procedure,                        the patient's blood pressure, pulse, and oxygen  saturations were monitored continuously. The                        Colonoscope was introduced through the anus and                        advanced to the the cecum, identified by appendiceal                        orifice and ileocecal valve. The colonoscopy was               performed without difficulty. The patient tolerated the                        procedure well. The quality of the bowel preparation                        was adequate. The ileocecal valve, appendiceal orifice,                        and rectum were photographed. Findings:      The perianal and digital rectal examinations were normal. Pertinent       negatives include normal sphincter tone and no palpable rectal lesions.      A few small-mouthed diverticula were found in the sigmoid colon.      Four sessile polyps were found in the rectum, transverse colon and       ascending colon. The polyps were 2 to 4 mm in size. These polyps were       removed with a jumbo cold forceps. Resection and retrieval were complete.      Non-bleeding internal hemorrhoids were found during retroflexion. The       hemorrhoids were Grade I (internal hemorrhoids that do not prolapse).      The exam was otherwise without abnormality. Impression:           - Diverticulosis in the sigmoid colon.                       - Four 2 to 4 mm polyps in the rectum, in the                        transverse colon and in the ascending colon, removed                        with a jumbo cold forceps. Resected and retrieved.                       - Non-bleeding internal hemorrhoids.                       - The examination was otherwise normal. Recommendation:       - Patient has a contact number available for                        emergencies. The signs and symptoms of potential                        delayed complications were discussed with the patient.  Return to normal activities tomorrow. Written discharge                        instructions were provided to the patient.                       - Resume previous diet.                       - Continue present medications.                       - Repeat colonoscopy is recommended for surveillance.                        The colonoscopy date will  be determined after pathology                        results from today's exam become available for review.                       - Return to GI office PRN.                       - The findings and recommendations were discussed with                        the patient and their spouse. Procedure Code(s):    --- Professional ---                       [phone], Colonoscopy, flexible; with biopsy, single or                        multiple Diagnosis Code(s):    --- Professional ---                       K57.30, Diverticulosis of large intestine without                        perforation or abscess without bleeding                       D12.2, Benign neoplasm of ascending colon                       D12.3, Benign neoplasm of transverse colon (hepatic                        flexure or splenic flexure)                       K62.1, Rectal polyp                       K64.0, First degree hemorrhoids                       Z12.11, Encounter for screening for malignant neoplasm                        of colon CPT copyright 2017 American Medical Association. All rights reserved. The codes documented in this report are preliminary and upon coder  review may  be revised to meet current compliance requirements. Efrain Sella MD, MD 11/21/2017 8:35:02 AM This report has been signed electronically. Number of Addenda: 0 Note Initiated On: 11/21/2017 7:35 AM Scope Withdrawal Time: 0 hours 8 minutes 57 seconds  Total Procedure Duration: 0 hours 12 minutes 40 seconds       [facility]

## 2017-11-21 NOTE — Anesthesia Post-op Follow-up Note (Signed)
Anesthesia QCDR form completed.        

## 2017-11-21 NOTE — Interval H&P Note (Signed)
History and Physical Interval Note:  11/21/2017 8:07 AM  Xavier Hebert  has presented today for surgery, with the diagnosis of COLON CANCER SCREENING  The various methods of treatment have been discussed with the patient and family. After consideration of risks, benefits and other options for treatment, the patient has consented to  Procedure(s): COLONOSCOPY WITH PROPOFOL (N/A) as a surgical intervention .  The patient's history has been reviewed, patient examined, no change in status, stable for surgery.  I have reviewed the patient's chart and labs.  Questions were answered to the patient's satisfaction.     [place], [place]

## 2017-11-21 NOTE — H&P (Signed)
Outpatient short stay form Pre-procedure 11/21/2017 8:07 AM Xavier Hebert K. Xavier Hebert, M.D.  Primary Physician: Xavier Hebert, M.D.  Reason for visit:  Colon cancer screening  History of present illness:  Patient presents for colon cancer screening. The patient denies abdominal pain, abnormal weight loss or rectal bleeding.     Current Facility-Administered Medications:  .  0.9 %  sodium chloride infusion, , Intravenous, Continuous, Xavier Hebert, Xavier Pike, MD, Last Rate: 20 mL/hr at 11/21/17 0747  Medications Prior to Admission  Medication Sig Dispense Refill Last Dose  . albuterol (PROVENTIL HFA;VENTOLIN HFA) 108 (90 Base) MCG/ACT inhaler Inhale 1 to 2 puffs into the lungs every 4 to 6 hours as needed for shortness of breath/wheezing   Past Month at Unknown time  . allopurinol (ZYLOPRIM) 100 MG tablet Take 100 mg by mouth daily.    11/21/2017 at 0600  . amLODipine (NORVASC) 5 MG tablet Take 5 mg by mouth daily.   11/21/2017 at 0600  . butalbital-acetaminophen-caffeine (FIORICET, ESGIC) 50-325-40 MG per tablet Take 2 tablets by mouth at start of headache and take 1 additional tablet by mouth after 2 hours if needed - max 3 tablets daily   11/20/2017 at 2300  . cholecalciferol (VITAMIN D) 1000 units tablet Take 1,000 Units by mouth daily.   11/21/2017 at 0600  . Coconut Oil 1000 MG CAPS Take 2,000 mg by mouth 2 (two) times daily.   11/20/2017 at 0600  . flunisolide (NASALIDE) 25 MCG/ACT (0.025%) SOLN Place 2 sprays into the nose 2 (two) times daily.   11/20/2017 at 0600  . fluticasone (FLONASE) 50 MCG/ACT nasal spray Place 1 spray into both nostrils 2 (two) times daily.   11/20/2017 at 0600  . hydrochlorothiazide (HYDRODIURIL) 25 MG tablet Take 25 mg by mouth daily.    11/21/2017 at 0600  . hydrOXYzine (ATARAX/VISTARIL) 50 MG tablet Take 1 tablet by mouth every 6 (six) hours as needed for itching.    11/21/2017 at 0600  . insulin aspart (NOVOLOG FLEXPEN) 100 UNIT/ML FlexPen Inject at mealtimes as needed for glucose control    11/20/2017 at Unknown time  . Insulin Glargine (BASAGLAR KWIKPEN) 100 UNIT/ML SOPN Inject 5-20 Units into the skin daily at 10 pm.    11/19/2017 at Unknown time  . lisinopril (PRINIVIL,ZESTRIL) 40 MG tablet Take 20 mg by mouth daily.    11/21/2017 at 0600  . magnesium oxide (MAG-OX) 400 MG tablet Take 400 mg by mouth daily.   11/20/2017 at 0600  . methocarbamol (ROBAXIN) 750 MG tablet Take 750 mg by mouth 4 (four) times daily.   11/21/2017 at 0600  . Multiple Vitamin (MULTIVITAMIN) tablet Take 1 tablet by mouth daily.   11/20/2017 at 0600  . omeprazole (PRILOSEC) 40 MG capsule Take 1 capsule (40 mg total) by mouth daily before breakfast. 30 capsule 1 11/21/2017 at 0600  . oxyCODONE (ROXICODONE) 15 MG immediate release tablet Take 15 mg by mouth every 6 (six) hours.    11/21/2017 at 0600  . rosuvastatin (CRESTOR) 10 MG tablet Take 10 mg by mouth at bedtime.    11/20/2017 at 2300  . Saw Palmetto 450 MG CAPS Take 2 capsules by mouth daily.   11/20/2017 at 2300  . Sildenafil Citrate (VIAGRA PO) Take by mouth.   11/20/2017 at 0600  . bifidobacterium infantis (ALIGN) capsule Take 1 capsule by mouth daily.    Not Taking at Unknown time  . clobetasol cream (TEMOVATE) 0.05 % Apply to affected area(s) 2 to 3 times weekly as needed  Past Month at Unknown time  . colchicine 0.6 MG tablet Take 1 tablet (0.6 mg total) by mouth daily. 30 tablet 0 Past Month at Unknown time  . HYDROPHILIC EX Apply 1 application topically 2 (two) times daily as needed (skin irritation).   10/24/2017 at 0600  . NONFORMULARY OR COMPOUNDED ITEM See pharmacy note (Patient taking differently: Apply 1 application topically daily. See pharmacy note) 120 each 2 Past Week at Unknown time  . Testosterone 30 MG/ACT SOLN Apply 1 pump transdermal under each arm daily   10/25/2017 at 0600     Allergies  Allergen Reactions  . Ivp Dye [Iodinated Diagnostic Agents] Anaphylaxis and Other (See Comments)    Breathing difficulty and chest pain  . Aspirin Other (See  Comments)    Stomach pain  . Ibuprofen Other (See Comments)    Stomach pain  . Ioxaglate Other (See Comments)    Breathing difficulty and chest pain  . Latex Itching     Past Medical History:  Diagnosis Date  . Anxiety   . Arthritis   . Chronic pain syndrome   . CKD (chronic kidney disease) stage 3, GFR 30-59 ml/min (HCC)   . Coronary artery disease   . Depression   . Diabetes mellitus without complication ([place])    Type II  . GERD (gastroesophageal reflux disease)   . Headache    Migraine- headache  . Hyperlipidemia   . Hypertension   . Kidney stones   . Peptic ulcer   . Sleep apnea     Review of systems:  Otherwise negative.    Physical Exam  Gen: Alert, oriented. Appears stated age.  HEENT: [place]/AT. PERRLA. Lungs: CTA, no wheezes. CV: RR nl S1, S2. Abd: soft, benign, no masses. BS+ Ext: No edema. Pulses 2+    Planned procedures: Proceed with colonoscopy. The patient understands the nature of the planned procedure, indications, risks, alternatives and potential complications including but not limited to bleeding, infection, perforation, damage to internal organs and possible oversedation/side effects from anesthesia. The patient agrees and gives consent to proceed.  Please refer to procedure notes for findings, recommendations and patient disposition/instructions.     Xavier Hebert K. Xavier Hebert, M.D. Gastroenterology 11/21/2017  8:07 AM

## 2017-11-22 ENCOUNTER — Encounter: Payer: Self-pay | Admitting: Internal Medicine

## 2017-11-24 LAB — SURGICAL PATHOLOGY

## 2017-12-18 ENCOUNTER — Encounter: Payer: Self-pay | Admitting: Podiatry

## 2017-12-18 ENCOUNTER — Ambulatory Visit (INDEPENDENT_AMBULATORY_CARE_PROVIDER_SITE_OTHER): Payer: Medicare Other | Admitting: Podiatry

## 2017-12-18 DIAGNOSIS — B351 Tinea unguium: Secondary | ICD-10-CM

## 2017-12-18 DIAGNOSIS — M79676 Pain in unspecified toe(s): Secondary | ICD-10-CM

## 2017-12-18 DIAGNOSIS — E0842 Diabetes mellitus due to underlying condition with diabetic polyneuropathy: Secondary | ICD-10-CM

## 2017-12-20 NOTE — Progress Notes (Signed)
   SUBJECTIVE Patient with a history of diabetes mellitus presents to office today complaining of elongated, thickened nails that cause pain while ambulating in shoes. He is unable to trim his own nails. Patient is here for further evaluation and treatment.   Past Medical History:  Diagnosis Date  . Anxiety   . Arthritis   . Chronic pain syndrome   . CKD (chronic kidney disease) stage 3, GFR 30-59 ml/min (HCC)   . Coronary artery disease   . Depression   . Diabetes mellitus without complication ([place])    Type II  . GERD (gastroesophageal reflux disease)   . Headache    Migraine- headache  . Hyperlipidemia   . Hypertension   . Kidney stones   . Peptic ulcer   . Sleep apnea     OBJECTIVE General Patient is awake, alert, and oriented x 3 and in no acute distress. Derm Skin is dry and supple bilateral. Negative open lesions or macerations. Remaining integument unremarkable. Nails are tender, long, thickened and dystrophic with subungual debris, consistent with onychomycosis, 1-5 bilateral. No signs of infection noted. Vasc  DP and PT pedal pulses palpable bilaterally. Temperature gradient within normal limits.  Neuro Epicritic and protective threshold sensation diminished bilaterally.  Musculoskeletal Exam No symptomatic pedal deformities noted bilateral. Muscular strength within normal limits.  ASSESSMENT 1. Diabetes Mellitus w/ peripheral neuropathy 2. 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 Pain in foot bilateral  PLAN OF CARE 1. Patient evaluated today. 2. Instructed to maintain good pedal hygiene and foot care. Stressed importance of controlling blood sugar.  3. Mechanical debridement of nails 1-5 bilaterally performed using a nail nipper. Filed with dremel without incident.  4. Return to clinic in 3 mos.     Edrick Kins, DPM [facility]  Dr. Edrick Kins, [address]                                          [place], [place] [address]                Office [phone]  Fax [phone]

## 2017-12-25 ENCOUNTER — Ambulatory Visit (INDEPENDENT_AMBULATORY_CARE_PROVIDER_SITE_OTHER): Payer: Medicare Other | Admitting: Podiatry

## 2017-12-25 ENCOUNTER — Ambulatory Visit (INDEPENDENT_AMBULATORY_CARE_PROVIDER_SITE_OTHER): Payer: Medicare Other

## 2017-12-25 ENCOUNTER — Other Ambulatory Visit: Payer: Self-pay | Admitting: Podiatry

## 2017-12-25 ENCOUNTER — Encounter: Payer: Self-pay | Admitting: Podiatry

## 2017-12-25 DIAGNOSIS — M778 Other enthesopathies, not elsewhere classified: Secondary | ICD-10-CM

## 2017-12-25 DIAGNOSIS — M779 Enthesopathy, unspecified: Secondary | ICD-10-CM

## 2017-12-25 DIAGNOSIS — M79672 Pain in left foot: Secondary | ICD-10-CM

## 2017-12-26 NOTE — Progress Notes (Signed)
   HPI: 72 year old male presenting today pain to the dorsal left foot that began yesterday upon waking. He notes a cyst or nodule to the area. Being on his feet increases the pain. He has not done anything for treatment. Patient is here for further evaluation and treatment.   Past Medical History:  Diagnosis Date  . Anxiety   . Arthritis   . Chronic pain syndrome   . CKD (chronic kidney disease) stage 3, GFR 30-59 ml/min (HCC)   . Coronary artery disease   . Depression   . Diabetes mellitus without complication ([place])    Type II  . GERD (gastroesophageal reflux disease)   . Headache    Migraine- headache  . Hyperlipidemia   . Hypertension   . Kidney stones   . Peptic ulcer   . Sleep apnea      Physical Exam: General: The patient is alert and oriented x3 in no acute distress.  Dermatology: Skin is warm, dry and supple bilateral lower extremities. Negative for open lesions or macerations.  Vascular: Palpable pedal pulses bilaterally. No edema or erythema noted. Capillary refill within normal limits.  Neurological: Epicritic and protective threshold grossly intact bilaterally.   Musculoskeletal Exam: Pain with palpation to the left midfoot. Range of motion within normal limits to all pedal and ankle joints bilateral. Muscle strength 5/5 in all groups bilateral.   Radiographic Exam:  Degenerative changes with joint space narrowing noted to the left midfoot.     Assessment: 1. Left midfoot capsulitis/DJD   Plan of Care:  1. Patient evaluated. X-Rays reviewed.  2. Injection of 0.5 mLs Celestone Soluspan injected into the left midfoot.  3. Continue good shoe gear.  4. Cannot take oral NSAIDs secondary to CKD.  5. Return to clinic as needed.       Edrick Kins, DPM [facility]  Dr. Edrick Kins, DPM    [address], [place] [address]                Office [phone]  Fax [phone]

## 2017-12-27 ENCOUNTER — Other Ambulatory Visit: Payer: Self-pay

## 2017-12-27 ENCOUNTER — Encounter
Admission: RE | Admit: 2017-12-27 | Discharge: 2017-12-27 | Disposition: A | Discharge status: Home / Self Care | Payer: Medicare Other | Source: Ambulatory Visit | Attending: Orthopedic Surgery | Admitting: Orthopedic Surgery

## 2017-12-27 DIAGNOSIS — Z01812 Encounter for preprocedural laboratory examination: Secondary | ICD-10-CM | POA: Diagnosis present

## 2017-12-27 LAB — POTASSIUM: POTASSIUM: 3.8 mmol/L (ref 3.5–5.1)

## 2017-12-27 NOTE — Patient Instructions (Signed)
Your procedure is scheduled on: January 03, 2018 THURSDAY Report to Day Surgery on the 2nd floor of the [facility]. To find out your arrival time, please call [phone] between 1PM - 3PM on: Wednesday January 02, 2018  REMEMBER: Instructions that are not followed completely may result in serious medical risk, up to and including death; or upon the discretion of your surgeon and anesthesiologist your surgery may need to be rescheduled.  Do not eat food after midnight the night before surgery.  No gum chewing, lozengers or hard candies.  You may however, drink CLEAR liquids up to 2 hours before you are scheduled to arrive for your surgery. Do not drink anything within 2 hours of the start of your surgery.  Clear liquids include: - water  Do NOT drink anything that is not on this list.  Type 1 and Type 2 diabetics should only drink water. Marland Kitchen  No Alcohol for 24 hours before or after surgery.  No Smoking including e-cigarettes for 24 hours prior to surgery.  No chewable tobacco products for at least 6 hours prior to surgery.  No nicotine patches on the day of surgery.  On the morning of surgery brush your teeth with toothpaste and water, you may rinse your mouth with mouthwash if you wish. Do not swallow any toothpaste or mouthwash.  Notify your doctor if there is any change in your medical condition (cold, fever, infection).  Do not wear jewelry, make-up, hairpins, clips or nail polish.  Do not wear lotions, powders, or perfumes. You may wear deodorant.  Do not shave 48 hours prior to surgery. Men may shave face and neck.  Contacts and dentures may not be worn into surgery.  Do not bring valuables to the hospital, including drivers license, insurance or credit cards.  [facility] is not responsible for any belongings or valuables.   TAKE THESE MEDICATIONS THE MORNING OF SURGERY: NORVASC ALLOPURINOL METHOCARBAMOL PROTONIX PAIN MED  Use CHG Soap as directed on  instruction sheet.  Take 1/2 of usual insulin dose the night before surgery and none on the morning of surgery.  Follow recommendations from Cardiologist, Pulmonologist or PCP regarding stopping Aspirin, Coumadin, Plavix, Eliquis, Pradaxa, or Pletal.  Stop Anti-inflammatories (NSAIDS) such as Advil, Aleve, Ibuprofen, Motrin, Naproxen, Naprosyn and Aspirin based products such as Excedrin, Goodys Powder, BC Powder. (May take Tylenol or Acetaminophen if needed.)  Stop ANY OVER THE COUNTER supplements until after surgery SAW PALMETTO, GLUCOSAMINE, COCONUT OIL,  (May continue Vitamin D, Vitamin B, and multivitamin MAG OXIDE.)  Wear comfortable clothing (specific to your surgery type) to the hospital.  Plan for stool softeners for home use.  If you are being admitted to the hospital overnight, leave your suitcase in the car. After surgery it may be brought to your room.  If you are being discharged the day of surgery, you will not be allowed to drive home. You will need a responsible adult to drive you home and stay with you that night.   If you are taking public transportation, you will need to have a responsible adult with you. Please confirm with your physician that it is acceptable to use public transportation.   Please call [phone] if you have any questions about these instructions.

## 2018-01-03 ENCOUNTER — Encounter
Admission: RE | Disposition: A | Discharge status: Home / Self Care | Payer: Self-pay | Source: Ambulatory Visit | Attending: Orthopedic Surgery

## 2018-01-03 ENCOUNTER — Other Ambulatory Visit: Payer: Self-pay

## 2018-01-03 ENCOUNTER — Ambulatory Visit: Payer: Medicare Other | Admitting: Certified Registered"

## 2018-01-03 ENCOUNTER — Ambulatory Visit
Admission: RE | Admit: 2018-01-03 | Discharge: 2018-01-03 | Disposition: A | Discharge status: Home / Self Care | Payer: Medicare Other | Source: Ambulatory Visit | Attending: Orthopedic Surgery | Admitting: Orthopedic Surgery

## 2018-01-03 DIAGNOSIS — Z8711 Personal history of peptic ulcer disease: Secondary | ICD-10-CM | POA: Diagnosis not present

## 2018-01-03 DIAGNOSIS — F329 Major depressive disorder, single episode, unspecified: Secondary | ICD-10-CM | POA: Insufficient documentation

## 2018-01-03 DIAGNOSIS — E785 Hyperlipidemia, unspecified: Secondary | ICD-10-CM | POA: Diagnosis not present

## 2018-01-03 DIAGNOSIS — Z888 Allergy status to other drugs, medicaments and biological substances status: Secondary | ICD-10-CM | POA: Insufficient documentation

## 2018-01-03 DIAGNOSIS — K219 Gastro-esophageal reflux disease without esophagitis: Secondary | ICD-10-CM | POA: Diagnosis not present

## 2018-01-03 DIAGNOSIS — M1990 Unspecified osteoarthritis, unspecified site: Secondary | ICD-10-CM | POA: Insufficient documentation

## 2018-01-03 DIAGNOSIS — Z91040 Latex allergy status: Secondary | ICD-10-CM | POA: Diagnosis not present

## 2018-01-03 DIAGNOSIS — Z79899 Other long term (current) drug therapy: Secondary | ICD-10-CM | POA: Diagnosis not present

## 2018-01-03 DIAGNOSIS — I2510 Atherosclerotic heart disease of native coronary artery without angina pectoris: Secondary | ICD-10-CM | POA: Insufficient documentation

## 2018-01-03 DIAGNOSIS — E1122 Type 2 diabetes mellitus with diabetic chronic kidney disease: Secondary | ICD-10-CM | POA: Diagnosis not present

## 2018-01-03 DIAGNOSIS — Z8249 Family history of ischemic heart disease and other diseases of the circulatory system: Secondary | ICD-10-CM | POA: Diagnosis not present

## 2018-01-03 DIAGNOSIS — Z794 Long term (current) use of insulin: Secondary | ICD-10-CM | POA: Diagnosis not present

## 2018-01-03 DIAGNOSIS — Z886 Allergy status to analgesic agent status: Secondary | ICD-10-CM | POA: Diagnosis not present

## 2018-01-03 DIAGNOSIS — G894 Chronic pain syndrome: Secondary | ICD-10-CM | POA: Diagnosis not present

## 2018-01-03 DIAGNOSIS — I129 Hypertensive chronic kidney disease with stage 1 through stage 4 chronic kidney disease, or unspecified chronic kidney disease: Secondary | ICD-10-CM | POA: Diagnosis not present

## 2018-01-03 DIAGNOSIS — Z981 Arthrodesis status: Secondary | ICD-10-CM | POA: Insufficient documentation

## 2018-01-03 DIAGNOSIS — N189 Chronic kidney disease, unspecified: Secondary | ICD-10-CM | POA: Diagnosis not present

## 2018-01-03 DIAGNOSIS — G4730 Sleep apnea, unspecified: Secondary | ICD-10-CM | POA: Diagnosis not present

## 2018-01-03 DIAGNOSIS — F419 Anxiety disorder, unspecified: Secondary | ICD-10-CM | POA: Insufficient documentation

## 2018-01-03 DIAGNOSIS — G5602 Carpal tunnel syndrome, left upper limb: Secondary | ICD-10-CM | POA: Diagnosis present

## 2018-01-03 DIAGNOSIS — Z91041 Radiographic dye allergy status: Secondary | ICD-10-CM | POA: Diagnosis not present

## 2018-01-03 HISTORY — PX: CARPAL TUNNEL RELEASE: SHX101

## 2018-01-03 LAB — GLUCOSE, CAPILLARY
GLUCOSE-CAPILLARY: 99 mg/dL (ref 70–99)
Glucose-Capillary: 93 mg/dL (ref 70–99)

## 2018-01-03 SURGERY — CARPAL TUNNEL RELEASE
Anesthesia: General | Laterality: Left

## 2018-01-03 MED ORDER — LIDOCAINE HCL (CARDIAC) PF 100 MG/5ML IV SOSY
PREFILLED_SYRINGE | INTRAVENOUS | Status: DC | PRN
  Administered 2018-01-03: 50 mg via INTRAVENOUS

## 2018-01-03 MED ORDER — FENTANYL CITRATE (PF) 100 MCG/2ML IJ SOLN: 25.0000 ug | INTRAMUSCULAR | Status: DC | PRN

## 2018-01-03 MED ORDER — PROPOFOL 10 MG/ML IV BOLUS
INTRAVENOUS | Status: AC
  Filled 2018-01-03: qty 20

## 2018-01-03 MED ORDER — ONDANSETRON HCL 4 MG/2ML IJ SOLN: 4.0000 mg | Freq: Once | INTRAMUSCULAR | Status: DC | PRN

## 2018-01-03 MED ORDER — SODIUM CHLORIDE 0.9 % IV SOLN
INTRAVENOUS | Status: DC
  Administered 2018-01-03: 08:00:00 via INTRAVENOUS

## 2018-01-03 MED ORDER — LIDOCAINE HCL (PF) 2 % IJ SOLN
INTRAMUSCULAR | Status: AC
  Filled 2018-01-03: qty 10

## 2018-01-03 MED ORDER — BUPIVACAINE HCL 0.5 % IJ SOLN
INTRAMUSCULAR | Status: DC | PRN
  Administered 2018-01-03: 20 mL

## 2018-01-03 MED ORDER — FENTANYL CITRATE (PF) 100 MCG/2ML IJ SOLN
INTRAMUSCULAR | Status: DC | PRN
  Administered 2018-01-03: 25 ug via INTRAVENOUS

## 2018-01-03 MED ORDER — PROPOFOL 500 MG/50ML IV EMUL
INTRAVENOUS | Status: DC | PRN
  Administered 2018-01-03: 120 ug/kg/min via INTRAVENOUS

## 2018-01-03 MED ORDER — PHENYLEPHRINE HCL 10 MG/ML IJ SOLN
INTRAMUSCULAR | Status: DC | PRN
  Administered 2018-01-03: 50 ug via INTRAVENOUS

## 2018-01-03 MED ORDER — FENTANYL CITRATE (PF) 100 MCG/2ML IJ SOLN
INTRAMUSCULAR | Status: AC
  Filled 2018-01-03: qty 2

## 2018-01-03 SURGICAL SUPPLY — 25 items
BANDAGE ACE 3X5.8 VEL STRL LF (GAUZE/BANDAGES/DRESSINGS) ×2 IMPLANT
CANISTER SUCT 1200ML W/VALVE (MISCELLANEOUS) ×1 IMPLANT
CHLORAPREP W/TINT 26ML (MISCELLANEOUS) ×2 IMPLANT
COVER WAND RF STERILE (DRAPES) ×2 IMPLANT
CUFF TOURN 18 STER (MISCELLANEOUS) IMPLANT
CUFF TOURN DUAL PL 12 NO SLV (MISCELLANEOUS) ×1 IMPLANT
ELECT CAUTERY NDL 2.0 MIC (NEEDLE) IMPLANT
ELECT CAUTERY NEEDLE 2.0 MIC (NEEDLE) IMPLANT
GAUZE PETRO XEROFOAM 1X8 (MISCELLANEOUS) ×2 IMPLANT
GAUZE SPONGE 4X4 12PLY STRL (GAUZE/BANDAGES/DRESSINGS) ×2 IMPLANT
GLOVE SURG SYN 9.0  PF PI (GLOVE) ×1
GLOVE SURG SYN 9.0 PF PI (GLOVE) ×1 IMPLANT
GOWN SRG 2XL LVL 4 RGLN SLV (GOWNS) ×1 IMPLANT
GOWN STRL NON-REIN 2XL LVL4 (GOWNS) ×1
GOWN STRL REUS W/ TWL LRG LVL3 (GOWN DISPOSABLE) ×1 IMPLANT
GOWN STRL REUS W/TWL LRG LVL3 (GOWN DISPOSABLE) ×1
KIT TURNOVER KIT A (KITS) ×2 IMPLANT
NS IRRIG 500ML POUR BTL (IV SOLUTION) ×2 IMPLANT
PACK EXTREMITY ARMC (MISCELLANEOUS) ×2 IMPLANT
PAD CAST CTTN 4X4 STRL (SOFTGOODS) ×1 IMPLANT
PADDING CAST COTTON 4X4 STRL (SOFTGOODS) ×1
SCALPEL PROTECTED #15 DISP (BLADE) ×4 IMPLANT
SUT ETHILON 4-0 (SUTURE) ×1
SUT ETHILON 4-0 FS2 18XMFL BLK (SUTURE) ×1
SUTURE ETHLN 4-0 FS2 18XMF BLK (SUTURE) ×1 IMPLANT

## 2018-01-03 NOTE — Anesthesia Postprocedure Evaluation (Signed)
Anesthesia Post Note  Patient: Xavier Hebert  Procedure(s) Performed: CARPAL TUNNEL RELEASE (Left )  Patient location during evaluation: PACU Anesthesia Type: General Level of consciousness: awake and alert Pain management: pain level controlled Vital Signs Assessment: post-procedure vital signs reviewed and stable Respiratory status: spontaneous breathing, nonlabored ventilation, respiratory function stable and patient connected to nasal cannula oxygen Cardiovascular status: blood pressure returned to baseline and stable Postop Assessment: no apparent nausea or vomiting Anesthetic complications: no     Last Vitals:  Vitals:   01/03/18 0949 01/03/18 1029  BP: 114/66 (!) 125/47  Pulse: 61 (!) 50  Resp: 18 18  Temp: (!) 36.1 C (!) 36.1 C  SpO2: 100% 100%    Last Pain:  Vitals:   01/03/18 1029  TempSrc: Temporal  PainSc: 0-No pain                 Martha Clan

## 2018-01-03 NOTE — Anesthesia Procedure Notes (Addendum)
Performed by: Laela Deviney, CRNA Pre-anesthesia Checklist: Patient identified, Emergency Drugs available, Suction available, Patient being monitored and Timeout performed Patient Re-evaluated:Patient Re-evaluated prior to induction Oxygen Delivery Method: Nasal cannula Induction Type: IV induction Ventilation: Nasal airway inserted- appropriate to patient size        

## 2018-01-03 NOTE — Progress Notes (Signed)
pts ace wrap  Loosened per instructions,

## 2018-01-03 NOTE — OR Nursing (Signed)
Dr Rosey Bath aware patient had a sip of cola with pills this am at [facility].  Also made aware of Libre sensor on upper left arm and pain patch on lower back.  No new orders at this time.

## 2018-01-03 NOTE — Transfer of Care (Signed)
Immediate Anesthesia Transfer of Care Note  Patient: Xavier Hebert  Procedure(s) Performed: CARPAL TUNNEL RELEASE (Left )  Patient Location: PACU  Anesthesia Type:General  Level of Consciousness: awake, alert  and responds to stimulation  Airway & Oxygen Therapy: Patient Spontanous Breathing and Patient connected to nasal cannula oxygen  Post-op Assessment: Report given to RN and Post -op Vital signs reviewed and stable  Post vital signs: Reviewed and stable  Last Vitals:  Vitals Value Taken Time  BP 99/68 01/03/2018  9:03 AM  Temp    Pulse    Resp 19 01/03/2018  9:03 AM  SpO2 100 % 01/03/2018  9:03 AM    Last Pain:  Vitals:   01/03/18 0718  TempSrc: Temporal  PainSc: 4          Complications: No apparent anesthesia complications

## 2018-01-03 NOTE — Op Note (Signed)
01/03/2018  9:03 AM  PATIENT:  Xavier Hebert  72 y.o. male  PRE-OPERATIVE DIAGNOSIS:  CARPAL TUNNEL SYNDROME LEFT  POST-OPERATIVE DIAGNOSIS:  CARPAL TUNNEL SYNDROME LEFT  PROCEDURE:  Procedure(s): CARPAL TUNNEL RELEASE (Left)  SURGEON: Laurene Footman, MD  ASSISTANTS: None  ANESTHESIA:   local and MAC  EBL:  No intake/output data recorded.  BLOOD ADMINISTERED:none  DRAINS: none   LOCAL MEDICATIONS USED:  MARCAINE     SPECIMEN:  No Specimen  DISPOSITION OF SPECIMEN:  N/A  COUNTS:  YES  TOURNIQUET: 13 minutes at 250 mmHg  IMPLANTS: None  DICTATION: .Dragon Dictation patient was brought to the operating room and after adequate sedation had been obtained the left arm was prepped and draped you sterile fashion with a tourniquet applied just below the elbow.  After patient identification and timeout procedure were completed, tourniquet was raised to 250 mmHg.  Prior to inflation of the tourniquet 10 cc of half percent Marcaine with out epinephrine was infiltrated near the planned incision to aid in anesthesia.  Approximately 2 cm incision was made in line with the ring metacarpal and the subcutaneous tissue spread.  There was a aberrant thenar muscle over the transverse carpal ligament which was elevated and the carpal ligament incised with a vascular hemostat placed deep to protect the underlying structures release was carried out distally until fat was noted around the nerve then proximally to the wrist flexion crease there still was not vascular blush at this point so the incision was extended slightly proximally and the antebrachial fascia was released about 2 cm up into the forearm and that appeared to be the area of constriction because following this there is good vascular blush to the nerve there is mild flexor tenosynovitis the wound was irrigated and then closed with simple interrupted 4-0 nylon skin sutures followed by Xeroform 4 x 4 web roll and Ace wrap  PLAN OF CARE:  Discharge to home after PACU  PATIENT DISPOSITION:  PACU - hemodynamically stable.

## 2018-01-03 NOTE — Anesthesia Post-op Follow-up Note (Signed)
Anesthesia QCDR form completed.        

## 2018-01-03 NOTE — Discharge Instructions (Addendum)
Work on finger motion is much as possible.  Loosen Ace wrap prior to discharge and if fingers well through the weekend.  Leave cotton wrap on underneath.  AMBULATORY SURGERY  DISCHARGE INSTRUCTIONS   1) The drugs that you were given will stay in your system until tomorrow so for the next 24 hours you should not:  A) Drive an automobile B) Make any legal decisions C) Drink any alcoholic beverage   2) You may resume regular meals tomorrow.  Today it is better to start with liquids and gradually work up to solid foods.  You may eat anything you prefer, but it is better to start with liquids, then soup and crackers, and gradually work up to solid foods.   3) Please notify your doctor immediately if you have any unusual bleeding, trouble breathing, redness and pain at the surgery site, drainage, fever, or pain not relieved by medication.    4) Additional Instructions:        Please contact your physician with any problems or Same Day Surgery at [phone], Monday through Friday 6 am to 4 pm, or [place] [facility] number at [phone].

## 2018-01-03 NOTE — H&P (Signed)
Reviewed paper H+P, will be scanned into chart. No changes noted.  

## 2018-01-03 NOTE — Anesthesia Preprocedure Evaluation (Signed)
Anesthesia Evaluation  Patient identified by MRN, date of birth, ID band Patient awake    Reviewed: Allergy & Precautions, H&P , NPO status , Patient's Chart, lab work & pertinent test results, reviewed documented beta blocker date and time   History of Anesthesia Complications Negative for: history of anesthetic complications  Airway Mallampati: III  TM Distance: >3 FB Neck ROM: full    Dental  (+) Partial Upper, Dental Advidsory Given   Pulmonary neg shortness of breath, sleep apnea , pneumonia, resolved, neg COPD, Recent URI , Residual Cough,           Cardiovascular Exercise Tolerance: Good hypertension, (-) angina+ CAD  (-) Past MI, (-) Cardiac Stents and (-) CABG (-) dysrhythmias (-) Valvular Problems/Murmurs     Neuro/Psych  Headaches, neg Seizures PSYCHIATRIC DISORDERS Anxiety Depression negative psych ROS   GI/Hepatic negative GI ROS, Neg liver ROS, PUD, GERD  ,  Endo/Other  diabetes  Renal/GU CRFRenal disease  negative genitourinary   Musculoskeletal  (+) Arthritis ,   Abdominal   Peds  Hematology negative hematology ROS (+)   Anesthesia Other Findings Past Medical History: No date: Anxiety No date: Arthritis No date: Chronic pain syndrome No date: CKD (chronic kidney disease) stage 3, GFR 30-59 ml/min (HCC) No date: Depression No date: 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 No date: GERD (gastroesophageal reflux disease) No date: Headache     Comment:  Migraine- headache No date: Hyperlipidemia No date: Hypertension No date: Kidney stones No date: Peptic ulcer   Reproductive/Obstetrics negative OB ROS                             Anesthesia Physical  Anesthesia Plan  ASA: III  Anesthesia Plan: General   Post-op Pain Management:    Induction: Intravenous  PONV Risk Score and Plan: 2 and Ondansetron, Dexamethasone and Treatment may  vary due to age or medical condition  Airway Management Planned: LMA  Additional Equipment:   Intra-op Plan:   Post-operative Plan: Extubation in OR  Informed Consent: I have reviewed the patients History and Physical, chart, labs and discussed the procedure including the risks, benefits and alternatives for the proposed anesthesia with the patient or authorized representative who has indicated his/her understanding and acceptance.   Dental Advisory Given  Plan Discussed with: Anesthesiologist, CRNA and Surgeon  Anesthesia Plan Comments:         Anesthesia Quick Evaluation

## 2018-01-04 ENCOUNTER — Encounter: Payer: Self-pay | Admitting: Orthopedic Surgery

## 2018-03-19 ENCOUNTER — Ambulatory Visit: Payer: Medicare Other | Admitting: Podiatry

## 2018-03-19 ENCOUNTER — Encounter: Payer: Self-pay | Admitting: Podiatry

## 2018-03-19 ENCOUNTER — Ambulatory Visit (INDEPENDENT_AMBULATORY_CARE_PROVIDER_SITE_OTHER): Payer: Medicare Other | Admitting: Podiatry

## 2018-03-19 DIAGNOSIS — B351 Tinea unguium: Secondary | ICD-10-CM

## 2018-03-19 DIAGNOSIS — M79676 Pain in unspecified toe(s): Secondary | ICD-10-CM

## 2018-03-19 DIAGNOSIS — E0842 Diabetes mellitus due to underlying condition with diabetic polyneuropathy: Secondary | ICD-10-CM

## 2018-03-22 NOTE — Progress Notes (Signed)
   SUBJECTIVE Patient with a history of diabetes mellitus presents to office today complaining of elongated, thickened nails that cause pain while ambulating in shoes. He is unable to trim his own nails. Patient is here for further evaluation and treatment.   Past Medical History:  Diagnosis Date  . Anxiety   . Arthritis   . Chronic pain syndrome   . CKD (chronic kidney disease) stage 3, GFR 30-59 ml/min (HCC)   . Depression   . Diabetes mellitus without complication ([place])    Type II  . GERD (gastroesophageal reflux disease)   . Headache    Migraine- headache  . Hyperlipidemia   . Hypertension   . Kidney stones   . Peptic ulcer   . Sleep apnea    does not use cpap    OBJECTIVE General Patient is awake, alert, and oriented x 3 and in no acute distress. Derm Skin is dry and supple bilateral. Negative open lesions or macerations. Remaining integument unremarkable. Nails are tender, long, thickened and dystrophic with subungual debris, consistent with onychomycosis, 1-5 bilateral. No signs of infection noted. Vasc  DP and PT pedal pulses palpable bilaterally. Temperature gradient within normal limits.  Neuro Epicritic and protective threshold sensation diminished bilaterally.  Musculoskeletal Exam No symptomatic pedal deformities noted bilateral. Muscular strength within normal limits.  ASSESSMENT 1. Diabetes Mellitus w/ peripheral neuropathy 2. 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 Pain in foot bilateral  PLAN OF CARE 1. Patient evaluated today. 2. Instructed to maintain good pedal hygiene and foot care. Stressed importance of controlling blood sugar.  3. Mechanical debridement of nails 1-5 bilaterally performed using a nail nipper. Filed with dremel without incident.  4. Return to clinic in 3 mos.     Edrick Kins, DPM [facility]  Dr. Edrick Kins, [address] [place]                                        [place], [place]  [address]                Office [phone]  Fax [phone]

## 2018-04-20 ENCOUNTER — Ambulatory Visit (INDEPENDENT_AMBULATORY_CARE_PROVIDER_SITE_OTHER): Payer: Medicare Other | Admitting: Podiatry

## 2018-04-20 ENCOUNTER — Encounter: Payer: Self-pay | Admitting: Podiatry

## 2018-04-20 DIAGNOSIS — M779 Enthesopathy, unspecified: Principal | ICD-10-CM

## 2018-04-20 DIAGNOSIS — M778 Other enthesopathies, not elsewhere classified: Secondary | ICD-10-CM

## 2018-04-20 DIAGNOSIS — M7752 Other enthesopathy of left foot: Secondary | ICD-10-CM

## 2018-04-20 MED ORDER — TRIAMCINOLONE ACETONIDE 10 MG/ML IJ SUSP
10.0000 mg | Freq: Once | INTRAMUSCULAR | Status: AC
  Administered 2018-04-20: 10 mg

## 2018-04-29 NOTE — Progress Notes (Signed)
Subjective:   Patient ID: Xavier Hebert, male   DOB: 73 y.o.   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

## 2018-06-18 ENCOUNTER — Encounter: Payer: Self-pay | Admitting: Podiatry

## 2018-06-18 ENCOUNTER — Other Ambulatory Visit: Payer: Self-pay

## 2018-06-18 ENCOUNTER — Ambulatory Visit (INDEPENDENT_AMBULATORY_CARE_PROVIDER_SITE_OTHER): Payer: Medicare Other | Admitting: Podiatry

## 2018-06-18 VITALS — Temp 97.0°F

## 2018-06-18 DIAGNOSIS — M79676 Pain in unspecified toe(s): Secondary | ICD-10-CM

## 2018-06-18 DIAGNOSIS — E0842 Diabetes mellitus due to underlying condition with diabetic polyneuropathy: Secondary | ICD-10-CM | POA: Diagnosis not present

## 2018-06-18 DIAGNOSIS — B351 Tinea unguium: Secondary | ICD-10-CM

## 2018-06-18 DIAGNOSIS — L603 Nail dystrophy: Secondary | ICD-10-CM | POA: Diagnosis not present

## 2018-06-18 NOTE — Progress Notes (Signed)
   SUBJECTIVE Patient T2DM presents to office today complaining of elongated, thickened nails that cause pain while ambulating in shoes.  He is unable to trim his own nails.  Patient also has a new complaint and complains of a dystrophic nail to the left great toenail.  He does have some pain associated to the nail and believes he gets ingrowing toenails occasionally.  He complains that the nail does not grow and is simply dystrophic and unsightly.  Patient is here for further evaluation and treatment.  Past Medical History:  Diagnosis Date  . Anxiety   . Arthritis   . Chronic pain syndrome   . CKD (chronic kidney disease) stage 3, GFR 30-59 ml/min (HCC)   . Depression   . Diabetes mellitus without complication ([place])    Type II  . GERD (gastroesophageal reflux disease)   . Headache    Migraine- headache  . Hyperlipidemia   . Hypertension   . Kidney stones   . Peptic ulcer   . Sleep apnea    does not use cpap    OBJECTIVE General Patient is awake, alert, and oriented x 3 and in no acute distress. Derm Skin is dry and supple bilateral. Negative open lesions or macerations. Remaining integument unremarkable. Nails are tender, long, thickened and dystrophic with subungual debris, consistent with onychomycosis, 1-5 bilateral.  Dystrophic nail noted left great toe with some sensitivity to palpation.  No signs of infection noted. Vasc  DP and PT pedal pulses palpable bilaterally. Temperature gradient within normal limits.  Neuro Epicritic and protective threshold sensation grossly intact bilaterally.  Musculoskeletal Exam No symptomatic pedal deformities noted bilateral. Muscular strength within normal limits.  ASSESSMENT 1. Onychodystrophic nails 1-5 bilateral with hyperkeratosis of nails.  2. 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  Symptomatic dystrophic nail left great toe  PLAN OF CARE 1. Patient evaluated today.  2. Instructed to maintain good pedal hygiene and  foot care.  3. Mechanical debridement of nails 1-5 bilaterally performed using a nail nipper. Filed with dremel without incident.  4.  I did give the patient the option today to permanently remove the left great toenail.  The patient agrees and would like the nail gone permanently.  3 cc of 2% lidocaine plain was infiltrated in a digital block fashion and the toe was prepped in aseptic manner.  Nail was avulsed followed by 3 x 26-STMHDQ application of phenol and alcohol flush.  Dry sterile dressing applied. 5.  The patient has mupirocin ointment at home.  Recommend mupirocin ointment and a Band-Aid daily 6.  Return to clinic in 3 weeks   Edrick Kins, DPM [facility]  Dr. Edrick Kins, [address]                                        [place], [place] [address]                Office [phone]  Fax [phone]

## 2018-06-19 MED ORDER — MUPIROCIN 2 % EX OINT: 1.0000 "application " | TOPICAL_OINTMENT | Freq: Two times a day (BID) | CUTANEOUS | 1 refills | Status: DC

## 2018-06-19 NOTE — Addendum Note (Signed)
Addended by: Graceann Congress D on: 06/19/2018 05:24 PM   Modules accepted: Orders

## 2018-07-26 ENCOUNTER — Telehealth: Payer: Self-pay

## 2018-07-26 MED ORDER — COLCHICINE 0.6 MG PO TABS: 0.6000 mg | ORAL_TABLET | Freq: Every day | ORAL | 1 refills | Status: DC

## 2018-07-26 NOTE — Telephone Encounter (Signed)
Per Dr. Amalia Hailey verbal order, its ok to refill colchicine for patient.  Script has been sent to his pharmacy and patient has been notified of script.

## 2018-07-26 NOTE — Telephone Encounter (Signed)
-----   Message from Arlyce Dice sent at 07/26/2018  8:08 AM EDT ----- Patient left Voicemail this morning asking for a request of this medication :colchicine 0.6 MG tablet. It was last filled on 09/18/17 -

## 2018-10-15 IMAGING — CT CT SHOULDER*R* W/O CM
1 series · 12 of 14 positions shown, 15 images · non-contrast
Comparison: None.

CLINICAL DATA: Pt fell [DATE][REDACTED] and caught himself with right
arm. Pt c/o pain to right shoulder since fall with movement. Painful
ROM when moving arm out to side.

EXAM:
CT OF THE UPPER RIGHT EXTREMITY WITHOUT CONTRAST
TECHNIQUE: Multidetector CT imaging of the upper right extremity was performed
according to the standard protocol.

[Series 7: ax st · axial · 0.39mm/px · z∈[-496,-326]mm · 12 of 104 slices shown, 15 images]
[im 8/104  soft-tissue]
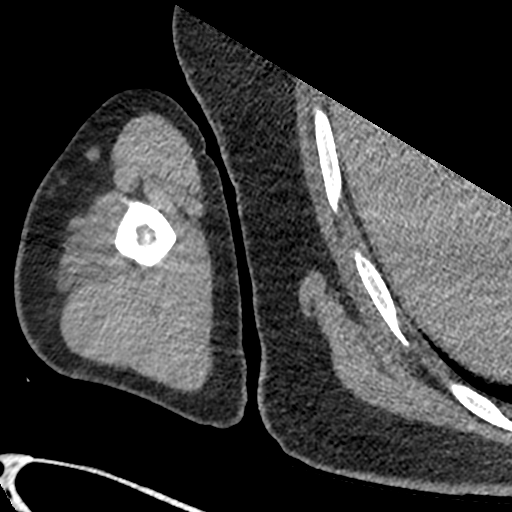
[im 8/104  bone]
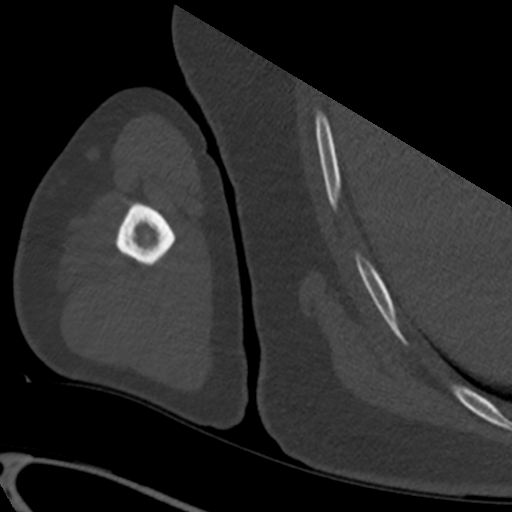
[im 16/104  bone]
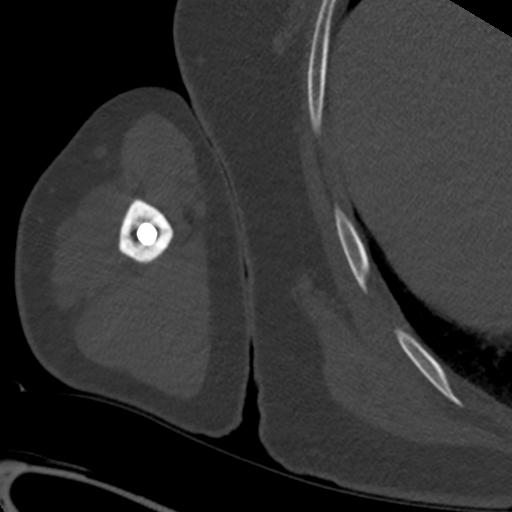
[im 24/104  bone]
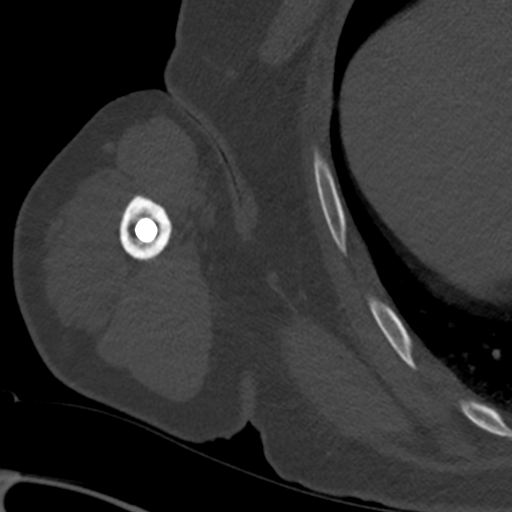
[im 32/104  bone]
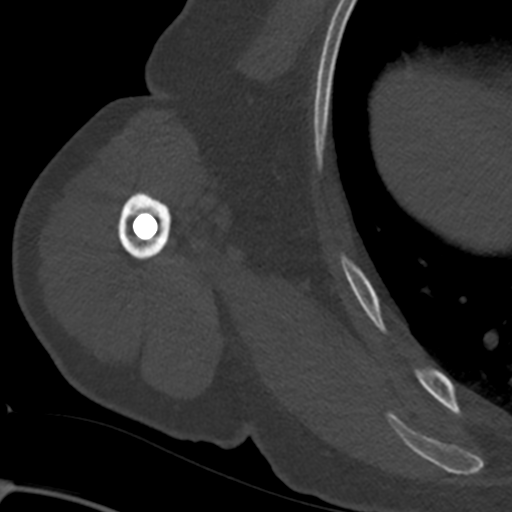
[im 40/104  soft-tissue]
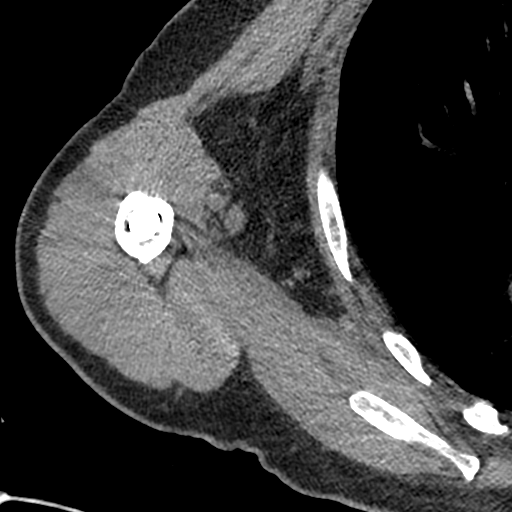
[im 40/104  bone]
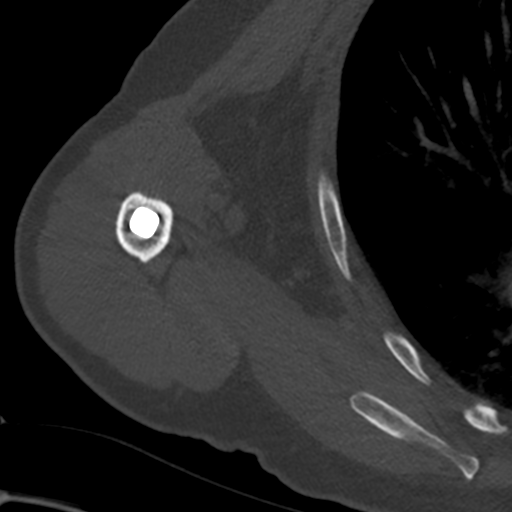
[im 48/104  bone]
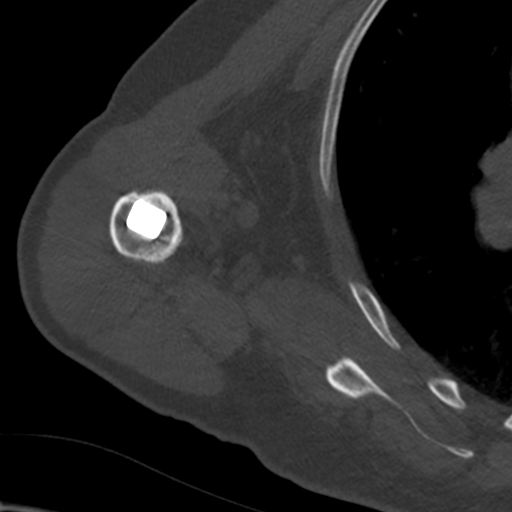
[im 56/104  bone]
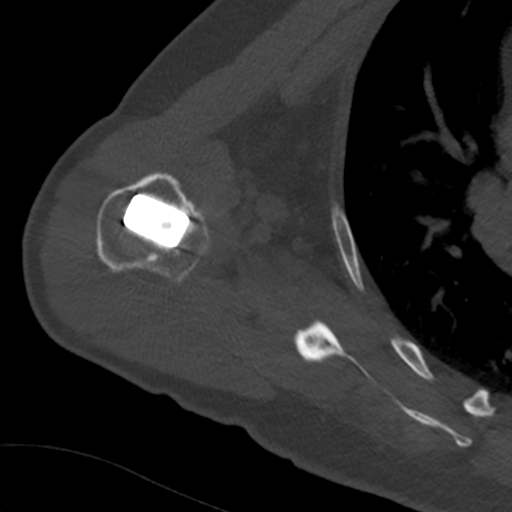
[im 64/104  bone]
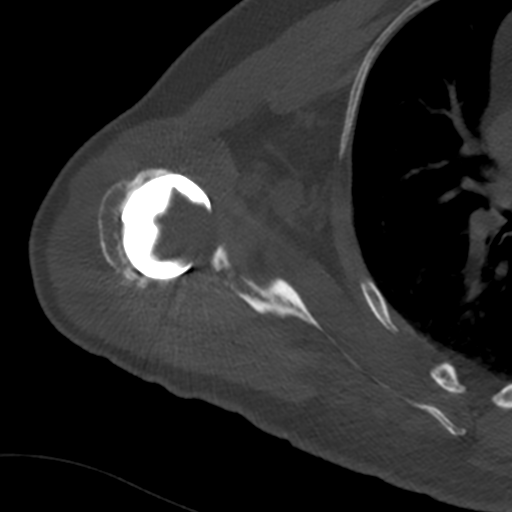
[im 72/104  soft-tissue]
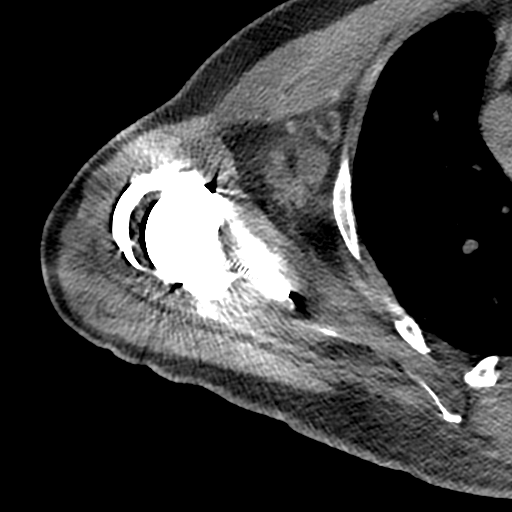
[im 72/104  bone]
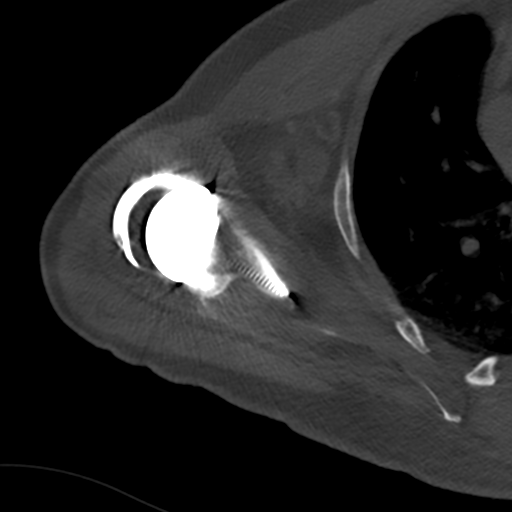
[im 80/104  bone]
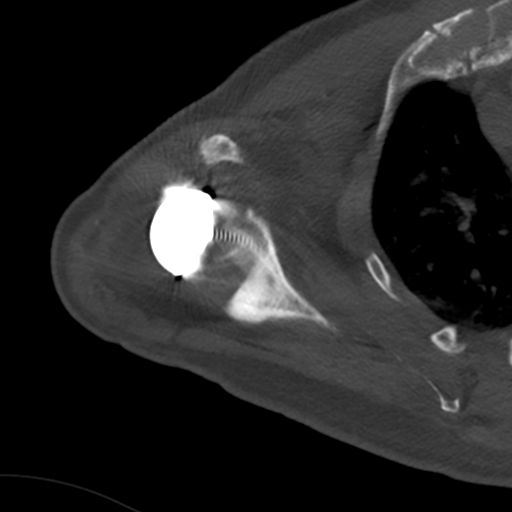
[im 88/104  bone]
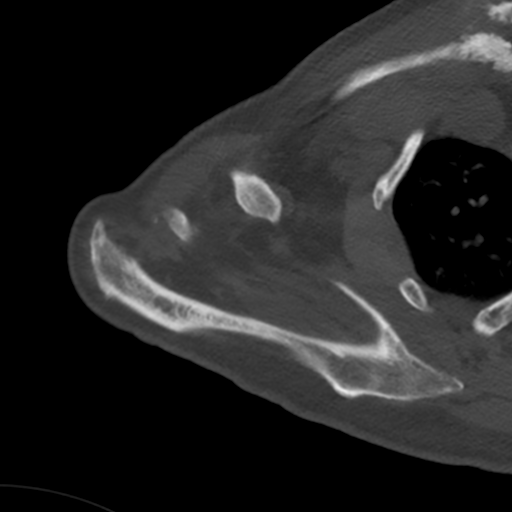
[im 96/104  bone]
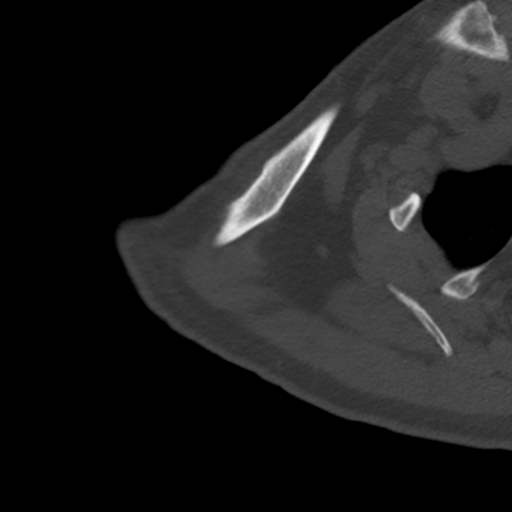

[12 of 14 positions shown; findings below may reference images not displayed]

FINDINGS: Bones/Joint/Cartilage

Right total reverse shoulder arthroplasty without failure or
complication. No hardware failure or complication. No acute fracture
or dislocation. Normal alignment. No joint effusion. No lytic or
sclerotic osseous lesion. Partially visualize are anterior bridging
osteophytes throughout the thoracic spine as can be seen with
diffuse idiopathic skeletal hyperostosis.

Ligaments

Ligaments are suboptimally evaluated by CT.

Muscles and Tendons
Large full-thickness tear of the supraspinatus tendon with 4.6 cm of
retraction. Few intact posterior fibers.

No significant muscle atrophy.

Soft tissue
No fluid collection or hematoma. No soft tissue mass. Visualize
right lung is clear.
IMPRESSION: 1. Large full-thickness tear of the supraspinatus tendon with 4.6 cm
of retraction. No significant muscle atrophy.

## 2018-10-28 ENCOUNTER — Encounter: Payer: Self-pay | Admitting: Podiatry

## 2018-10-28 ENCOUNTER — Ambulatory Visit (INDEPENDENT_AMBULATORY_CARE_PROVIDER_SITE_OTHER): Payer: Medicare Other

## 2018-10-28 ENCOUNTER — Other Ambulatory Visit: Payer: Self-pay

## 2018-10-28 ENCOUNTER — Ambulatory Visit (INDEPENDENT_AMBULATORY_CARE_PROVIDER_SITE_OTHER): Payer: Medicare Other | Admitting: Podiatry

## 2018-10-28 ENCOUNTER — Telehealth: Payer: Self-pay | Admitting: Podiatry

## 2018-10-28 VITALS — Temp 98.6°F

## 2018-10-28 DIAGNOSIS — E0842 Diabetes mellitus due to underlying condition with diabetic polyneuropathy: Secondary | ICD-10-CM

## 2018-10-28 DIAGNOSIS — M659 Synovitis and tenosynovitis, unspecified: Secondary | ICD-10-CM

## 2018-10-28 DIAGNOSIS — R600 Localized edema: Secondary | ICD-10-CM | POA: Diagnosis not present

## 2018-10-28 DIAGNOSIS — M79676 Pain in unspecified toe(s): Secondary | ICD-10-CM

## 2018-10-28 DIAGNOSIS — B351 Tinea unguium: Secondary | ICD-10-CM | POA: Diagnosis not present

## 2018-10-28 DIAGNOSIS — M65972 Unspecified synovitis and tenosynovitis, left ankle and foot: Secondary | ICD-10-CM

## 2018-10-28 NOTE — Telephone Encounter (Signed)
Pt came in the office today and saw Dr. Amalia Hailey, on the way home, he had some severe pain . It come and goes but it hurts really bad when it come. Could you please call patient. Pt wanted to know if he can go ahead and prescribed the Colchicine since he only had a few left

## 2018-10-28 NOTE — Telephone Encounter (Signed)
Pt states he is having gout-type pain in the left great toe that was not present at the appt. Pt states the wrap is behind the toes not pressing the painful area. I told pt to go into a stiff bottom shoe if possible with the wrap and back off on activity. Pt states he has taken 2 colchicine this evening with a meal and would like a refill.

## 2018-10-29 ENCOUNTER — Other Ambulatory Visit: Payer: Self-pay | Admitting: Podiatry

## 2018-10-29 MED ORDER — COLCHICINE 0.6 MG PO TABS: 0.6000 mg | ORAL_TABLET | Freq: Every day | ORAL | 1 refills | Status: DC

## 2018-10-29 NOTE — Telephone Encounter (Signed)
Refill sent. Thanks

## 2018-10-30 NOTE — Progress Notes (Signed)
   SUBJECTIVE Patient presents to office today for follow up evaluation of left ankle pain. He states the pain is worsening. He reports associated swelling. He has not had any recent treatment for the symptoms.  He is also complaining of elongated, thickened nails that cause pain while ambulating in shoes. He is unable to trim his own nails. Patient is here for further evaluation and treatment.  Past Medical History:  Diagnosis Date  . Anxiety   . Arthritis   . Chronic pain syndrome   . CKD (chronic kidney disease) stage 3, GFR 30-59 ml/min (HCC)   . Depression   . Diabetes mellitus without complication ([place])    Type II  . GERD (gastroesophageal reflux disease)   . Headache    Migraine- headache  . Hyperlipidemia   . Hypertension   . Kidney stones   . Peptic ulcer   . Sleep apnea    does not use cpap    OBJECTIVE General Patient is awake, alert, and oriented x 3 and in no acute distress. Derm Skin is dry and supple bilateral. Negative open lesions or macerations. Remaining integument unremarkable. Nails are tender, long, thickened and dystrophic with subungual debris, consistent with onychomycosis, 1-5 bilateral. No signs of infection noted. Vasc  Left lower edema noted. DP and PT pedal pulses palpable bilaterally. Temperature gradient within normal limits.  Neuro Epicritic and protective threshold sensation grossly intact bilaterally.  Musculoskeletal Exam Pain with palpation noted to the anterior, lateral and medial aspects of the left ankle. No symptomatic pedal deformities noted bilateral. Muscular strength within normal limits.  Radiographic Exam:  Normal osseous mineralization. Joint spaces preserved. No fracture/dislocation/boney destruction.    ASSESSMENT 1. Onychodystrophic nails 1-5 bilateral with hyperkeratosis of nails.  2. 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 LLE edema 4. Ankle synovitis / capsulitis left   PLAN OF CARE 1. Patient evaluated  today. X-Rays reviewed.  2. Instructed to maintain good pedal hygiene and foot care.  3. Mechanical debridement of nails 1-5 bilaterally performed using a nail nipper. Filed with dremel without incident.  4. Injection of 0.5 mLs Celestone Soluspan injected into the left ankle joint.  5. Resume taking Colchicine 0.6 mg daily.  6. Unna boot wrap applied to the LLE.  7. Return to clinic in 3 weeks.    Edrick Kins, DPM [facility]  Dr. Edrick Kins, [address]                                        [place], [place] [address]                Office [phone]  Fax [phone]

## 2018-11-19 ENCOUNTER — Ambulatory Visit (INDEPENDENT_AMBULATORY_CARE_PROVIDER_SITE_OTHER): Payer: Medicare Other | Admitting: Podiatry

## 2018-11-19 ENCOUNTER — Other Ambulatory Visit: Payer: Self-pay

## 2018-11-19 ENCOUNTER — Encounter: Payer: Self-pay | Admitting: Podiatry

## 2018-11-19 DIAGNOSIS — L600 Ingrowing nail: Secondary | ICD-10-CM

## 2018-11-19 NOTE — Patient Instructions (Signed)

## 2018-11-21 NOTE — Progress Notes (Signed)
   Subjective: Patient presents today for follow up evaluation of left ankle pain and LLE swelling. He states he is doing much better and his symptoms have improved. He states receiving the injection helped alleviate his pain. He denies any worsening factors at this time.  He also complains of stabbing pain to the medial border of the left hallux that began a few weeks ago. Patient is concerned for possible ingrown nail. Touching the area and applying pressure increases the pain. He has not had any treatment for the symptoms. Patient presents today for further treatment and evaluation.  Past Medical History:  Diagnosis Date  . Anxiety   . Arthritis   . Chronic pain syndrome   . CKD (chronic kidney disease) stage 3, GFR 30-59 ml/min (HCC)   . Depression   . Diabetes mellitus without complication ([place])    Type II  . GERD (gastroesophageal reflux disease)   . Headache    Migraine- headache  . Hyperlipidemia   . Hypertension   . Kidney stones   . Peptic ulcer   . Sleep apnea    does not use cpap    Objective:  General: Well developed, nourished, in no acute distress, alert and oriented x3   Dermatology: Skin is warm, dry and supple bilateral. Medial border of the left hallux appears to be erythematous with evidence of an ingrowing nail / nail spicule noted. Pain on palpation noted to the border of the nail fold. The remaining nails appear unremarkable at this time. There are no open sores, lesions.  Vascular: Dorsalis Pedis artery and Posterior Tibial artery pedal pulses palpable. No lower extremity edema noted.   Neruologic: Grossly intact via light touch bilateral.  Musculoskeletal: Muscular strength within normal limits in all groups bilateral. Normal range of motion noted to all pedal and ankle joints.   Assesement: #1 Paronychia with ingrowing nail / nail spicule medial border left hallux  #2 LLE edema - resolved #3 ankle synovitis / capsulitis left - resolved   Plan of  Care:  1. Patient evaluated.  2. Discussed treatment alternatives and plan of care. Explained nail avulsion procedure and post procedure course to patient. 3. Patient opted for permanent partial nail avulsion of the medial border left hallux.  4. Prior to procedure, local anesthesia infiltration utilized using 3 ml of a 50:50 mixture of 2% plain lidocaine and 0.5% plain marcaine in a normal hallux block fashion and a betadine prep performed.  5. Partial permanent nail avulsion with chemical matrixectomy performed using [id number] applications of phenol followed by alcohol flush.  6. Light dressing applied. 7. Return to clinic as needed.  Edrick Kins, DPM [facility]  Dr. Edrick Kins, [address]                                        [place], [place] [address]                Office [phone]  Fax [phone]

## 2019-02-18 ENCOUNTER — Other Ambulatory Visit: Payer: Self-pay

## 2019-02-18 ENCOUNTER — Ambulatory Visit (INDEPENDENT_AMBULATORY_CARE_PROVIDER_SITE_OTHER): Payer: Medicare Other | Admitting: Podiatry

## 2019-02-18 DIAGNOSIS — M79676 Pain in unspecified toe(s): Secondary | ICD-10-CM | POA: Diagnosis not present

## 2019-02-18 DIAGNOSIS — B351 Tinea unguium: Secondary | ICD-10-CM | POA: Diagnosis not present

## 2019-02-18 MED ORDER — COLCHICINE 0.6 MG PO TABS: 0.6000 mg | ORAL_TABLET | Freq: Every day | ORAL | 1 refills | Status: DC

## 2019-02-21 NOTE — Progress Notes (Signed)
   SUBJECTIVE Patient presents to office today complaining of elongated, thickened nails that cause pain while ambulating in shoes. He is unable to trim his own nails. Patient is here for further evaluation and treatment.  Past Medical History:  Diagnosis Date  . Anxiety   . Arthritis   . Chronic pain syndrome   . CKD (chronic kidney disease) stage 3, GFR 30-59 ml/min (HCC)   . Depression   . Diabetes mellitus without complication ([place])    Type II  . GERD (gastroesophageal reflux disease)   . Headache    Migraine- headache  . Hyperlipidemia   . Hypertension   . Kidney stones   . Peptic ulcer   . Sleep apnea    does not use cpap    OBJECTIVE General Patient is awake, alert, and oriented x 3 and in no acute distress. Derm Skin is dry and supple bilateral. Negative open lesions or macerations. Remaining integument unremarkable. Nails are tender, long, thickened and dystrophic with subungual debris, consistent with onychomycosis, 1-5 bilateral. No signs of infection noted. Vasc  DP and PT pedal pulses palpable bilaterally. Temperature gradient within normal limits.  Neuro Epicritic and protective threshold sensation grossly intact bilaterally.  Musculoskeletal Exam No symptomatic pedal deformities noted bilateral. Muscular strength within normal limits.  ASSESSMENT 1. Onychodystrophic nails 1-5 bilateral with hyperkeratosis of nails.  2. 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 H/o recurrent capsulitis bilateral feet   PLAN OF CARE 1. Patient evaluated today.  2. Instructed to maintain good pedal hygiene and foot care.  3. Mechanical debridement of nails 1-5 bilaterally performed using a nail nipper. Filed with dremel without incident.  4. Refill prescription for Colcrys 0.6 mg daily as needed for pain provided to patient.  5. Return to clinic in 3 mos.    Edrick Kins, DPM [facility]  Dr. Edrick Kins, [address]                                         [place], [place] [address]                Office [phone]  Fax [phone]

## 2019-04-03 ENCOUNTER — Other Ambulatory Visit: Payer: Self-pay

## 2019-04-03 ENCOUNTER — Ambulatory Visit (INDEPENDENT_AMBULATORY_CARE_PROVIDER_SITE_OTHER): Payer: Medicare Other | Admitting: Podiatry

## 2019-04-03 ENCOUNTER — Encounter: Payer: Self-pay | Admitting: Podiatry

## 2019-04-03 DIAGNOSIS — M79672 Pain in left foot: Secondary | ICD-10-CM

## 2019-04-03 DIAGNOSIS — M778 Other enthesopathies, not elsewhere classified: Secondary | ICD-10-CM

## 2019-04-03 DIAGNOSIS — M7752 Other enthesopathy of left foot: Secondary | ICD-10-CM

## 2019-04-04 ENCOUNTER — Encounter: Payer: Self-pay | Admitting: Podiatry

## 2019-04-04 NOTE — Progress Notes (Signed)
Subjective:  Patient ID: Xavier Hebert, male    DOB: Jul 20, 1945,  MRN: [id number]  Chief Complaint  Patient presents with  . Foot Pain    Patient presents today for left lateral foot pain x 4-5 days.  He states "its intermittent sharp pains and now its not bad today"    74 y.o. male presents with the above complaint.  Patient presents with left lateral foot pain.  Patient states that this started about 4 to 5 days ago.  The pain comes and goes.  It is a sharp shooting pain.  He states that it is worse when ambulating.  Patient has a history of diabetes with unknown glucose.  He denies any other acute complaints.  Would like to know if there is an injection that he could get because he believes that this is arthritic flare.   Review of Systems: Negative except as noted in the HPI. Denies N/V/F/Ch.  Past Medical History:  Diagnosis Date  . Anxiety   . Arthritis   . Chronic pain syndrome   . CKD (chronic kidney disease) stage 3, GFR 30-59 ml/min   . Depression   . Diabetes mellitus without complication ([place])    Type II  . GERD (gastroesophageal reflux disease)   . Headache    Migraine- headache  . Hyperlipidemia   . Hypertension   . Kidney stones   . Peptic ulcer   . Sleep apnea    does not use cpap    Current Outpatient Medications:  .  allopurinol (ZYLOPRIM) 100 MG tablet, Take 100 mg by mouth daily. , Disp: , Rfl:  .  amLODipine (NORVASC) 5 MG tablet, Take 5 mg by mouth daily., Disp: , Rfl:  .  butalbital-acetaminophen-caffeine (FIORICET, ESGIC) 50-325-40 MG per tablet, Take 1-2 tablets by mouth See admin instructions. Take 2 tablets by mouth at start of headache and take 1 additional tablet by mouth after 2 hours if needed - max 3 tablets daily, Disp: , Rfl:  .  cholecalciferol (VITAMIN D) 1000 units tablet, Take 1,000 Units by mouth daily., Disp: , Rfl:  .  clobetasol cream (TEMOVATE) [id number] %, Apply 1 application topically daily as needed (eczema). Apply to affected area(s)  2 to 3 times weekly as needed, Disp: , Rfl:  .  Coconut Oil 1000 MG CAPS, Take 2,000 mg by mouth 2 (two) times daily., Disp: , Rfl:  .  colchicine 0.6 MG tablet, Take 1 tablet (0.6 mg total) by mouth daily., Disp: 30 tablet, Rfl: 1 .  flunisolide (NASALIDE) 25 MCG/ACT (0.025%) SOLN, Place 2 sprays into the nose 2 (two) times daily., Disp: , Rfl:  .  furosemide (LASIX) 20 MG tablet, , Disp: , Rfl:  .  Glucosamine-Chondroitin (GLUCOSAMINE CHONDR COMPLEX PO), Take 1 tablet by mouth 2 (two) times daily., Disp: , Rfl:  .  hydrochlorothiazide (HYDRODIURIL) 25 MG tablet, Take 25 mg by mouth daily. , Disp: , Rfl:  .  hydrOXYzine (ATARAX/VISTARIL) 50 MG tablet, Take 50 mg by mouth 4 (four) times daily. , Disp: , Rfl:  .  insulin aspart (NOVOLOG FLEXPEN) 100 UNIT/ML FlexPen, Inject 3-9 Units into the skin 3 (three) times daily with meals. , Disp: , Rfl:  .  Insulin Glargine (BASAGLAR KWIKPEN) 100 UNIT/ML SOPN, Inject 5-20 Units into the skin daily at 10 pm. , Disp: , Rfl:  .  ketoconazole (NIZORAL) 2 % shampoo, Apply 1 application topically once a week. , Disp: , Rfl:  .  lisinopril (PRINIVIL,ZESTRIL) 40 MG tablet,  Take 20 mg by mouth daily. , Disp: , Rfl:  .  magnesium oxide (MAG-OX) 400 MG tablet, Take 400 mg by mouth daily., Disp: , Rfl:  .  methocarbamol (ROBAXIN) 750 MG tablet, Take 750 mg by mouth 4 (four) times daily., Disp: , Rfl:  .  Multiple Vitamin (MULTIVITAMIN) tablet, Take 1 tablet by mouth daily., Disp: , Rfl:  .  mupirocin ointment (BACTROBAN) 2 %, Apply 1 application topically 2 (two) times daily., Disp: 30 g, Rfl: 1 .  NONFORMULARY OR COMPOUNDED ITEM, See pharmacy note (Patient taking differently: Apply 1 application topically daily. See pharmacy note), Disp: 120 each, Rfl: 2 .  oxyCODONE (ROXICODONE) 15 MG immediate release tablet, Take 15 mg by mouth every 6 (six) hours. , Disp: , Rfl:  .  pantoprazole (PROTONIX) 40 MG tablet, Take 40 mg by mouth 2 (two) times daily. , Disp: , Rfl:  .   rosuvastatin (CRESTOR) 10 MG tablet, Take 10 mg by mouth at bedtime. , Disp: , Rfl:  .  Saw Palmetto, Serenoa repens, (SAW PALMETTO PO), Take 800 mg by mouth daily. , Disp: , Rfl:  .  Sildenafil Citrate (VIAGRA PO), Take by mouth., Disp: , Rfl:  .  Testosterone 30 MG/ACT SOLN, Apply 1 pump transdermal under each arm daily, Disp: , Rfl:   Social History   Tobacco Use  Smoking Status Never Smoker  Smokeless Tobacco Never Used    Allergies  Allergen Reactions  . Ivp Dye [Iodinated Diagnostic Agents] Anaphylaxis and Other (See Comments)    Breathing difficulty and chest pain  . Aspirin Other (See Comments)    Stomach pain  . Glipizide Other (See Comments)    Other reaction(s): Chest pain  . Hydrochlorothiazide Other (See Comments)    Other reaction(s): Impotence  . Ibuprofen Other (See Comments)    Stomach pain  . Ioxaglate Other (See Comments)    Breathing difficulty and chest pain  . Latex Itching  . Omeprazole Other (See Comments)    Other reaction(s): FAILED THERAPY  . Pregabalin Other (See Comments)    Other reaction(s): Dizziness  . Quinine Other (See Comments)    Other reaction(s): SHORTNESS OF BREATH  . Pioglitazone Other (See Comments) and Diarrhea    Other reaction(s): Diarrhea, Abdominal pain   Objective:  There were no vitals filed for this visit. There is no height or weight on file to calculate BMI. Constitutional Well developed. Well nourished.  Vascular Dorsalis pedis pulses palpable bilaterally. Posterior tibial pulses palpable bilaterally. Capillary refill normal to all digits.  No cyanosis or clubbing noted. Pedal hair growth normal.  Neurologic Normal speech. Oriented to person, place, and time. Epicritic sensation to light touch grossly present bilaterally.  Dermatologic Nails well groomed and normal in appearance. No open wounds. No skin lesions.  Orthopedic:  Left lateral midfoot pain on palpation.  Pain with range of motion of the  tarsometatarsal joints.  No pain with ankle range of motion.  No pain at the Achilles the ATFL peroneal tendon or posterior tibial tendon.  No pain at the head of the metatarsals.   Radiographs: None Assessment:   1. Capsulitis of left tarsus   2. Left foot pain    Plan:  Patient was evaluated and treated and all questions answered.  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  I believe patient will benefit from a steroid injection at the point of  maximal tenderness.  Patient agrees with this plan would like to proceed with injection. -A steroid injection was performed at left lateral midfoot using 1% plain Lidocaine and 10 mg of Kenalog. This was well tolerated. -Patient will follow up with me or Dr. Amalia Hailey as needed.  No follow-ups on file.

## 2019-05-20 ENCOUNTER — Ambulatory Visit (INDEPENDENT_AMBULATORY_CARE_PROVIDER_SITE_OTHER): Payer: Medicare Other | Admitting: Podiatry

## 2019-05-20 ENCOUNTER — Other Ambulatory Visit: Payer: Self-pay

## 2019-05-20 DIAGNOSIS — R600 Localized edema: Secondary | ICD-10-CM

## 2019-05-20 DIAGNOSIS — M79676 Pain in unspecified toe(s): Secondary | ICD-10-CM

## 2019-05-20 DIAGNOSIS — B351 Tinea unguium: Secondary | ICD-10-CM | POA: Diagnosis not present

## 2019-05-20 MED ORDER — COLCHICINE 0.6 MG PO TABS: 0.6000 mg | ORAL_TABLET | Freq: Every day | ORAL | 1 refills | Status: DC

## 2019-05-20 MED ORDER — MUPIROCIN 2 % EX OINT: 1.0000 "application " | TOPICAL_OINTMENT | Freq: Two times a day (BID) | CUTANEOUS | 1 refills | Status: DC

## 2019-05-23 NOTE — Progress Notes (Signed)
   SUBJECTIVE Patient presents to office today complaining of elongated, thickened nails that cause pain while ambulating in shoes. He is unable to trim his own nails.  He also reports swelling of the bilateral lower extremities. He states he recently had a nerve conduction study done and was diagnosed with neuropathy. He denies modifying factors at this time. Patient is here for further evaluation and treatment.  Past Medical History:  Diagnosis Date  . Anxiety   . Arthritis   . Chronic pain syndrome   . CKD (chronic kidney disease) stage 3, GFR 30-59 ml/min   . Depression   . Diabetes mellitus without complication ([place])    Type II  . GERD (gastroesophageal reflux disease)   . Headache    Migraine- headache  . Hyperlipidemia   . Hypertension   . Kidney stones   . Peptic ulcer   . Sleep apnea    does not use cpap    OBJECTIVE General Patient is awake, alert, and oriented x 3 and in no acute distress. Derm Skin is dry and supple bilateral. Negative open lesions or macerations. Remaining integument unremarkable. Nails are tender, long, thickened and dystrophic with subungual debris, consistent with onychomycosis, 1-5 bilateral. No signs of infection noted. Vasc  Bilateral lower extremity edema noted. DP and PT pedal pulses palpable bilaterally. Temperature gradient within normal limits.  Neuro Epicritic and protective threshold sensation grossly intact bilaterally.  Musculoskeletal Exam No symptomatic pedal deformities noted bilateral. Muscular strength within normal limits.  ASSESSMENT 1. Onychodystrophic nails 1-5 bilateral with hyperkeratosis of nails.  2. 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 Bilateral lower extremity edema   PLAN OF CARE 1. Patient evaluated today.  2. Instructed to maintain good pedal hygiene and foot care.  3. Mechanical debridement of nails 1-5 bilaterally performed using a nail nipper. Filed with dremel without incident.  4. Unna boot  applied bilaterally.  5. Refill prescription for Colcrys 0.6 mg provided to patient.  6. Refill prescription for Mupirocin 2% ointment provided to patient.  7. Return to clinic as needed.    Edrick Kins, DPM [facility]  Dr. Edrick Kins, [address]                                        [place], [place] [address]                Office [phone]  Fax [phone]

## 2019-05-27 ENCOUNTER — Other Ambulatory Visit: Payer: Self-pay

## 2019-05-27 ENCOUNTER — Ambulatory Visit (INDEPENDENT_AMBULATORY_CARE_PROVIDER_SITE_OTHER): Payer: Medicare Other | Admitting: Podiatry

## 2019-05-27 DIAGNOSIS — M7752 Other enthesopathy of left foot: Secondary | ICD-10-CM | POA: Diagnosis not present

## 2019-05-27 DIAGNOSIS — R600 Localized edema: Secondary | ICD-10-CM | POA: Diagnosis not present

## 2019-05-30 NOTE — Progress Notes (Signed)
   SUBJECTIVE 74 year old male presenting today with a chief complaint of a painful left great toe that began about one week ago. He states the pain is worsened when the toe rubs against his shoes. He has not done anything for treatment. Patient is here for further evaluation and treatment.   Past Medical History:  Diagnosis Date  . Anxiety   . Arthritis   . Chronic pain syndrome   . CKD (chronic kidney disease) stage 3, GFR 30-59 ml/min   . Depression   . Diabetes mellitus without complication ([place])    Type II  . GERD (gastroesophageal reflux disease)   . Headache    Migraine- headache  . Hyperlipidemia   . Hypertension   . Kidney stones   . Peptic ulcer   . Sleep apnea    does not use cpap    OBJECTIVE General Patient is awake, alert, and oriented x 3 and in no acute distress. Derm Skin is dry and supple bilateral. Negative open lesions or macerations. Remaining integument unremarkable. Nails are tender, thickened and dystrophic with subungual debris, consistent with onychomycosis, 1-5 bilateral. No signs of infection noted. Vasc  Bilateral lower extremity edema noted. DP and PT pedal pulses palpable bilaterally. Temperature gradient within normal limits.  Neuro Epicritic and protective threshold sensation grossly intact bilaterally.  Musculoskeletal Exam Pain with palpation noted to the 1st MPJ of the left foot. No symptomatic pedal deformities noted bilateral. Muscular strength within normal limits.  ASSESSMENT 1. Onychomycosis of nail due to dermatophyte bilateral - improved  2. 1st MPJ capsulitis left   PLAN OF CARE 1. Patient evaluated today.  2. Unna boots removed today.  3. Injection of 0.5 mLs Celestone Soluspan injected into the 1st MPJ of the left foot.  4. Recommended good shoe gear.  5. Return to clinic as needed.    Edrick Kins, DPM [facility]  Dr. Edrick Kins, [address]                                         [place], [place] [address]                Office [phone]  Fax [phone]

## 2019-07-15 ENCOUNTER — Telehealth: Payer: Self-pay

## 2019-07-15 MED ORDER — SILVER SULFADIAZINE 1 % EX CREA: 1.0000 "application " | TOPICAL_CREAM | Freq: Every day | CUTANEOUS | 1 refills | Status: DC

## 2019-07-15 NOTE — Telephone Encounter (Signed)
Patient called requesting a prescription for Silvadene cream sent to his pharmacy.  Per Dr. Rebekah Chesterfield verbal order, ok to give new prescription with refills.    New script has been sent to his pharmacy

## 2019-07-17 DIAGNOSIS — N183 Chronic kidney disease, stage 3 unspecified: Secondary | ICD-10-CM | POA: Insufficient documentation

## 2019-07-17 DIAGNOSIS — E1122 Type 2 diabetes mellitus with diabetic chronic kidney disease: Secondary | ICD-10-CM | POA: Insufficient documentation

## 2019-07-17 DIAGNOSIS — R600 Localized edema: Secondary | ICD-10-CM | POA: Insufficient documentation

## 2019-07-17 HISTORY — DX: Chronic kidney disease, stage 3 unspecified: N18.30

## 2019-07-18 DIAGNOSIS — I38 Endocarditis, valve unspecified: Secondary | ICD-10-CM | POA: Insufficient documentation

## 2019-07-22 ENCOUNTER — Ambulatory Visit (INDEPENDENT_AMBULATORY_CARE_PROVIDER_SITE_OTHER): Payer: Medicare Other | Admitting: Podiatry

## 2019-07-22 ENCOUNTER — Other Ambulatory Visit: Payer: Self-pay

## 2019-07-22 DIAGNOSIS — B351 Tinea unguium: Secondary | ICD-10-CM

## 2019-07-22 DIAGNOSIS — M79676 Pain in unspecified toe(s): Secondary | ICD-10-CM

## 2019-07-29 NOTE — Progress Notes (Signed)
   SUBJECTIVE Patient presents to office today complaining of elongated, thickened nails that cause pain while ambulating in shoes. He is unable to trim his own nails. Patient is here for further evaluation and treatment.  Past Medical History:  Diagnosis Date  . Anxiety   . Arthritis   . Chronic pain syndrome   . CKD (chronic kidney disease) stage 3, GFR 30-59 ml/min   . Depression   . Diabetes mellitus without complication ([place])    Type II  . GERD (gastroesophageal reflux disease)   . Headache    Migraine- headache  . Hyperlipidemia   . Hypertension   . Kidney stones   . Peptic ulcer   . Sleep apnea    does not use cpap    OBJECTIVE General Patient is awake, alert, and oriented x 3 and in no acute distress. Derm Superficial skin breakdown noted to the right leg. Skin is dry and supple bilateral. Nails are tender, long, thickened and dystrophic with subungual debris, consistent with onychomycosis, 1-5 bilateral. No signs of infection noted. Vasc  DP and PT pedal pulses palpable bilaterally. Temperature gradient within normal limits.  Neuro Epicritic and protective threshold sensation diminished bilaterally.  Musculoskeletal Exam No symptomatic pedal deformities noted bilateral. Muscular strength within normal limits.  ASSESSMENT 1. Onychodystrophic nails 1-5 bilateral with hyperkeratosis of nails.  2. 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 Pain in foot bilateral 4. Superficial skin breakdown right leg   PLAN OF CARE 1. Patient evaluated today.  2. Instructed to maintain good pedal hygiene and foot care.  3. Mechanical debridement of nails 1-5 bilaterally performed using a nail nipper. Filed with dremel without incident.  4. Continue management of ulceration with dermatology at the [facility].  5. Return to clinic in 3 mos.    Edrick Kins, DPM [facility]  Dr. Edrick Kins, [address]                                         [place], [place] [address]                Office [phone]  Fax [phone]

## 2019-09-12 ENCOUNTER — Other Ambulatory Visit: Payer: Self-pay

## 2019-09-12 MED ORDER — COLCHICINE 0.6 MG PO TABS: 0.6000 mg | ORAL_TABLET | Freq: Every day | ORAL | 1 refills | Status: DC

## 2019-09-12 NOTE — Telephone Encounter (Signed)
Patient called requesting a refill of Colchicine.  Per Dr. Amalia Hailey, ok to give refills.   New script has been sent to his pharmacy

## 2019-10-28 ENCOUNTER — Ambulatory Visit (INDEPENDENT_AMBULATORY_CARE_PROVIDER_SITE_OTHER): Payer: Medicare Other | Admitting: Podiatry

## 2019-10-28 ENCOUNTER — Other Ambulatory Visit: Payer: Self-pay

## 2019-10-28 DIAGNOSIS — M659 Synovitis and tenosynovitis, unspecified: Secondary | ICD-10-CM

## 2019-10-28 DIAGNOSIS — M79676 Pain in unspecified toe(s): Secondary | ICD-10-CM

## 2019-10-28 DIAGNOSIS — M65071 Abscess of tendon sheath, right ankle and foot: Secondary | ICD-10-CM

## 2019-10-28 DIAGNOSIS — B351 Tinea unguium: Secondary | ICD-10-CM

## 2019-10-28 NOTE — Progress Notes (Signed)
   SUBJECTIVE Patient presents to office today complaining of elongated, thickened nails that cause pain while ambulating in shoes. He is unable to trim his own nails. Patient also states that he has had some discomfort and pain associated to the lateral aspect of the right ankle.  Chronic in nature and he denies any history of injury.  Slow gradual onset.  He has not done anything for treatment.  Patient is here for further evaluation and treatment.  Past Medical History:  Diagnosis Date  . Anxiety   . Arthritis   . Chronic pain syndrome   . CKD (chronic kidney disease) stage 3, GFR 30-59 ml/min   . Depression   . Diabetes mellitus without complication ([place])    Type II  . GERD (gastroesophageal reflux disease)   . Headache    Migraine- headache  . Hyperlipidemia   . Hypertension   . Kidney stones   . Peptic ulcer   . Sleep apnea    does not use cpap    OBJECTIVE General Patient is awake, alert, and oriented x 3 and in no acute distress. Derm Superficial skin breakdown noted to the right leg. Skin is dry and supple bilateral. Nails are tender, long, thickened and dystrophic with subungual debris, consistent with onychomycosis, 1-5 bilateral. No signs of infection noted. Vasc  DP and PT pedal pulses palpable bilaterally. Temperature gradient within normal limits.  Neuro Epicritic and protective threshold sensation diminished bilaterally.  Musculoskeletal Exam No symptomatic pedal deformities noted bilateral. Muscular strength within normal limits.  There is some pain on palpation noted to the lateral aspect of the right ankle joint.  ASSESSMENT 1.  Pain due to onychomycosis of toenails bilateral 1-5 2.  Synovitis/DJD of right ankle 3. Pain in foot bilateral 4. Superficial skin breakdown right leg posterior aspect  PLAN OF CARE 1. Patient evaluated today.  2. Instructed to maintain good pedal hygiene and foot care.  3. Mechanical debridement of nails 1-5 bilaterally performed  using a nail nipper. Filed with dremel without incident.  4. Continue management of ulceration with dermatology at the [facility].  5.  Injection of 0.5 cc Celestone Soluspan injection to the right ankle joint  6.  Return to clinic in 3 mos.    Edrick Kins, DPM [facility]  Dr. Edrick Kins, [address]                                        [place], [place] [address]                Office [phone]  Fax [phone]

## 2019-11-25 ENCOUNTER — Ambulatory Visit (INDEPENDENT_AMBULATORY_CARE_PROVIDER_SITE_OTHER): Payer: Medicare Other | Admitting: Podiatry

## 2019-11-25 ENCOUNTER — Other Ambulatory Visit: Payer: Self-pay

## 2019-11-25 ENCOUNTER — Encounter: Payer: Self-pay | Admitting: Podiatry

## 2019-11-25 DIAGNOSIS — M7751 Other enthesopathy of right foot: Secondary | ICD-10-CM | POA: Diagnosis not present

## 2019-11-25 MED ORDER — COLCHICINE 0.6 MG PO TABS: 0.6000 mg | ORAL_TABLET | Freq: Every day | ORAL | 1 refills | Status: DC

## 2019-11-26 NOTE — Progress Notes (Signed)
Subjective:  Patient ID: Xavier Hebert, male    DOB: 26-Mar-1945,  MRN: [id number]  Chief Complaint  Patient presents with  . Foot Pain    "My right ankle flared back up about 10 days ago and I want another injection"    74 y.o. male presents with the above complaint.  Patient presents with right ankle pain that has flared back up about 10 days has progressive gotten worse.  Patient has been taking colchicine as well which has helped a little bit but not quite there.  He states that he would like another refill on colchicine and he is completely out.  He would also like another injection as they do tend to help.  Patient known to Dr. Amalia Hailey who regularly gets injections from him.  He denies any other acute complaints.   Review of Systems: Negative except as noted in the HPI. Denies N/V/F/Ch.  Past Medical History:  Diagnosis Date  . Anxiety   . Arthritis   . Chronic pain syndrome   . CKD (chronic kidney disease) stage 3, GFR 30-59 ml/min   . Depression   . Diabetes mellitus without complication ([place])    Type II  . GERD (gastroesophageal reflux disease)   . Headache    Migraine- headache  . Hyperlipidemia   . Hypertension   . Kidney stones   . Peptic ulcer   . Sleep apnea    does not use cpap    Current Outpatient Medications:  .  allopurinol (ZYLOPRIM) 100 MG tablet, Take 100 mg by mouth daily. , Disp: , Rfl:  .  amLODipine (NORVASC) 5 MG tablet, Take 5 mg by mouth daily., Disp: , Rfl:  .  butalbital-acetaminophen-caffeine (FIORICET, ESGIC) 50-325-40 MG per tablet, Take 1-2 tablets by mouth See admin instructions. Take 2 tablets by mouth at start of headache and take 1 additional tablet by mouth after 2 hours if needed - max 3 tablets daily, Disp: , Rfl:  .  cholecalciferol (VITAMIN D) 1000 units tablet, Take 1,000 Units by mouth daily., Disp: , Rfl:  .  clobetasol cream (TEMOVATE) 0.62 %, Apply 1 application topically daily as needed (eczema). Apply to affected area(s) 2 to 3  times weekly as needed, Disp: , Rfl:  .  Coconut Oil 1000 MG CAPS, Take 2,000 mg by mouth 2 (two) times daily., Disp: , Rfl:  .  colchicine 0.6 MG tablet, Take 1 tablet (0.6 mg total) by mouth daily., Disp: 30 tablet, Rfl: 1 .  flunisolide (NASALIDE) 25 MCG/ACT (0.025%) SOLN, Place 2 sprays into the nose 2 (two) times daily., Disp: , Rfl:  .  furosemide (LASIX) 20 MG tablet, , Disp: , Rfl:  .  Glucosamine-Chondroitin (GLUCOSAMINE CHONDR COMPLEX PO), Take 1 tablet by mouth 2 (two) times daily., Disp: , Rfl:  .  hydrochlorothiazide (HYDRODIURIL) 25 MG tablet, Take 25 mg by mouth daily. , Disp: , Rfl:  .  hydrOXYzine (ATARAX/VISTARIL) 50 MG tablet, Take 50 mg by mouth 4 (four) times daily. , Disp: , Rfl:  .  insulin aspart (NOVOLOG FLEXPEN) 100 UNIT/ML FlexPen, Inject 3-9 Units into the skin 3 (three) times daily with meals. , Disp: , Rfl:  .  Insulin Glargine (BASAGLAR KWIKPEN) 100 UNIT/ML SOPN, Inject 5-20 Units into the skin daily at 10 pm. , Disp: , Rfl:  .  ketoconazole (NIZORAL) 2 % shampoo, Apply 1 application topically once a week. , Disp: , Rfl:  .  lisinopril (PRINIVIL,ZESTRIL) 40 MG tablet, Take 20 mg by mouth  daily. , Disp: , Rfl:  .  magnesium oxide (MAG-OX) 400 MG tablet, Take 400 mg by mouth daily., Disp: , Rfl:  .  methocarbamol (ROBAXIN) 750 MG tablet, Take 750 mg by mouth 4 (four) times daily., Disp: , Rfl:  .  Multiple Vitamin (MULTIVITAMIN) tablet, Take 1 tablet by mouth daily., Disp: , Rfl:  .  mupirocin ointment (BACTROBAN) 2 %, Apply 1 application topically 2 (two) times daily., Disp: 30 g, Rfl: 1 .  NONFORMULARY OR COMPOUNDED ITEM, See pharmacy note (Patient taking differently: Apply 1 application topically daily. See pharmacy note), Disp: 120 each, Rfl: 2 .  oxyCODONE (ROXICODONE) 15 MG immediate release tablet, Take 15 mg by mouth every 6 (six) hours. , Disp: , Rfl:  .  pantoprazole (PROTONIX) 40 MG tablet, Take 40 mg by mouth 2 (two) times daily. , Disp: , Rfl:  .   rosuvastatin (CRESTOR) 10 MG tablet, Take 10 mg by mouth at bedtime. , Disp: , Rfl:  .  Saw Palmetto, Serenoa repens, (SAW PALMETTO PO), Take 800 mg by mouth daily. , Disp: , Rfl:  .  Sildenafil Citrate (VIAGRA PO), Take by mouth., Disp: , Rfl:  .  silver sulfADIAZINE (SILVADENE) 1 % cream, Apply 1 application topically daily., Disp: 50 g, Rfl: 1 .  Testosterone 30 MG/ACT SOLN, Apply 1 pump transdermal under each arm daily, Disp: , Rfl:   Social History   Tobacco Use  Smoking Status Never Smoker  Smokeless Tobacco Never Used    Allergies  Allergen Reactions  . Ivp Dye [Iodinated Diagnostic Agents] Anaphylaxis and Other (See Comments)    Breathing difficulty and chest pain  . Aspirin Other (See Comments)    Stomach pain  . Glipizide Other (See Comments)    Other reaction(s): Chest pain  . Hydrochlorothiazide Other (See Comments)    Other reaction(s): Impotence  . Ibuprofen Other (See Comments)    Stomach pain  . Ioxaglate Other (See Comments)    Breathing difficulty and chest pain  . Latex Itching  . Omeprazole Other (See Comments)    Other reaction(s): FAILED THERAPY  . Pregabalin Other (See Comments)    Other reaction(s): Dizziness  . Quinine Other (See Comments)    Other reaction(s): SHORTNESS OF BREATH  . Pioglitazone Other (See Comments) and Diarrhea    Other reaction(s): Diarrhea, Abdominal pain   Objective:  There were no vitals filed for this visit. There is no height or weight on file to calculate BMI. Constitutional Well developed. Well nourished.  Vascular Dorsalis pedis pulses palpable bilaterally. Posterior tibial pulses palpable bilaterally. Capillary refill normal to all digits.  No cyanosis or clubbing noted. Pedal hair growth normal.  Neurologic Normal speech. Oriented to person, place, and time. Epicritic sensation to light touch grossly present bilaterally.  Dermatologic Nails well groomed and normal in appearance. No open wounds. No skin lesions.   Orthopedic:  Pain on palpation to the right lateral ankle gutter and the ATFL ligament.  Patient states is painful to move it.  Patient has deep intra-articular pain as well.  No pain at the Achilles tendon, peroneal tendon, posterior tibial tendon.  Pain with dorsiflexion and plantarflexion of the foot especially with plantarflexion and inversion.   Radiographs: None Assessment:   1. Capsulitis of ankle, right    Plan:  Patient was evaluated and treated and all questions answered.  Right ankle capsulitis secondary to gout flare -I explained to patient the etiology of ankle capsulitis and various treatment options were discussed.  I believe patient will benefit from a steroid injection to help decrease acute inflammatory component associated pain.  Patient has also run out of colchicine for which I will send in a refill as it tends to help him a lot with pain control.   -A steroid injection was performed at right ankle joint using 1% plain Lidocaine and 10 mg of Kenalog. This was well tolerated. -Colchicine was refilled   No follow-ups on file.

## 2019-11-27 DIAGNOSIS — K219 Gastro-esophageal reflux disease without esophagitis: Secondary | ICD-10-CM | POA: Insufficient documentation

## 2019-11-27 DIAGNOSIS — G894 Chronic pain syndrome: Secondary | ICD-10-CM | POA: Insufficient documentation

## 2019-11-27 DIAGNOSIS — M5126 Other intervertebral disc displacement, lumbar region: Secondary | ICD-10-CM | POA: Insufficient documentation

## 2019-11-27 DIAGNOSIS — F411 Generalized anxiety disorder: Secondary | ICD-10-CM | POA: Insufficient documentation

## 2019-11-27 DIAGNOSIS — H40009 Preglaucoma, unspecified, unspecified eye: Secondary | ICD-10-CM | POA: Insufficient documentation

## 2019-11-27 DIAGNOSIS — I8310 Varicose veins of unspecified lower extremity with inflammation: Secondary | ICD-10-CM | POA: Insufficient documentation

## 2019-11-27 HISTORY — DX: Preglaucoma, unspecified, unspecified eye: H40.009

## 2020-02-03 ENCOUNTER — Other Ambulatory Visit: Payer: Self-pay

## 2020-02-03 ENCOUNTER — Ambulatory Visit (INDEPENDENT_AMBULATORY_CARE_PROVIDER_SITE_OTHER): Payer: Medicare Other | Admitting: Podiatry

## 2020-02-03 DIAGNOSIS — M79676 Pain in unspecified toe(s): Secondary | ICD-10-CM | POA: Diagnosis not present

## 2020-02-03 DIAGNOSIS — B351 Tinea unguium: Secondary | ICD-10-CM | POA: Diagnosis not present

## 2020-02-03 NOTE — Progress Notes (Signed)
   SUBJECTIVE Patient presents to office today complaining of elongated, thickened nails that cause pain while ambulating in shoes.  He is unable to trim his own nails. Patient is here for further evaluation and treatment.  Past Medical History:  Diagnosis Date  . Anxiety   . Arthritis   . Chronic pain syndrome   . CKD (chronic kidney disease) stage 3, GFR 30-59 ml/min   . Depression   . Diabetes mellitus without complication ([place])    Type II  . GERD (gastroesophageal reflux disease)   . Headache    Migraine- headache  . Hyperlipidemia   . Hypertension   . Kidney stones   . Peptic ulcer   . Sleep apnea    does not use cpap    OBJECTIVE General Patient is awake, alert, and oriented x 3 and in no acute distress. Derm Skin is dry and supple bilateral. Negative open lesions or macerations. Remaining integument unremarkable. Nails are tender, long, thickened and dystrophic with subungual debris, consistent with onychomycosis, 1-5 bilateral. No signs of infection noted.  Hyperkeratotic preulcerative callus tissue noted to the bilateral feet and toes Vasc  DP and PT pedal pulses palpable bilaterally. Temperature gradient within normal limits.  Neuro Epicritic and protective threshold sensation grossly intact bilaterally.  Musculoskeletal Exam No symptomatic pedal deformities noted bilateral. Muscular strength within normal limits.  ASSESSMENT 1. Onychodystrophic nails 1-5 bilateral with hyperkeratosis of nails.  2. 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 Pain in foot bilateral 4.  Preulcerative calluses bilateral feet  PLAN OF CARE 1. Patient evaluated today.  2. Instructed to maintain good pedal hygiene and foot care.  3. Mechanical debridement of nails 1-5 bilaterally performed using a nail nipper. Filed with dremel without incident.  4.  Excisional debridement of the hyperkeratotic preulcerative callus tissue was performed using a tissue nipper without incident or  bleeding  5.  Return to clinic in 3 mos.    Edrick Kins, DPM [facility]  Dr. Edrick Kins, DPM    [address], [place] [address]                Office [phone]  Fax [phone]

## 2020-02-26 ENCOUNTER — Telehealth: Payer: Self-pay | Admitting: Podiatry

## 2020-02-26 NOTE — Telephone Encounter (Signed)
Patient has requested refill for Colchicine, please advise

## 2020-02-27 ENCOUNTER — Other Ambulatory Visit: Payer: Self-pay | Admitting: Podiatry

## 2020-02-27 MED ORDER — COLCHICINE 0.6 MG PO TABS: 0.6000 mg | ORAL_TABLET | Freq: Every day | ORAL | 1 refills | Status: DC

## 2020-02-27 NOTE — Progress Notes (Signed)
PRN

## 2020-02-27 NOTE — Telephone Encounter (Signed)
Rx sent 

## 2020-03-30 ENCOUNTER — Ambulatory Visit: Payer: Medicare Other

## 2020-03-30 ENCOUNTER — Other Ambulatory Visit: Payer: Self-pay

## 2020-03-30 ENCOUNTER — Ambulatory Visit (INDEPENDENT_AMBULATORY_CARE_PROVIDER_SITE_OTHER): Payer: Medicare Other

## 2020-03-30 ENCOUNTER — Ambulatory Visit (INDEPENDENT_AMBULATORY_CARE_PROVIDER_SITE_OTHER): Payer: Medicare Other | Admitting: Podiatry

## 2020-03-30 DIAGNOSIS — M778 Other enthesopathies, not elsewhere classified: Secondary | ICD-10-CM

## 2020-03-30 NOTE — Progress Notes (Signed)
   HPI: 75 y.o. male presenting today for new complaint regarding sharp shooting pains that have developed over the past week to the right midfoot.  Patient denies a history of injury however he states over the last week he has had severe pain and tenderness to the right foot.  He has been applying Voltaren gel and taking Percocet for the pain.  He presents for further treatment and evaluation  Past Medical History:  Diagnosis Date  . Anxiety   . Arthritis   . Chronic pain syndrome   . CKD (chronic kidney disease) stage 3, GFR 30-59 ml/min   . Depression   . Diabetes mellitus without complication ([place])    Type II  . GERD (gastroesophageal reflux disease)   . Headache    Migraine- headache  . Hyperlipidemia   . Hypertension   . Kidney stones   . Peptic ulcer   . Sleep apnea    does not use cpap     Physical Exam: General: The patient is alert and oriented x3 in no acute distress.  Dermatology: Skin is warm, dry and supple bilateral lower extremities. Negative for open lesions or macerations.  Vascular: Palpable pedal pulses bilaterally. No edema or erythema noted. Capillary refill within normal limits.  Neurological: Epicritic and protective threshold grossly intact bilaterally.   Musculoskeletal Exam: Range of motion within normal limits to all pedal and ankle joints bilateral. Muscle strength 5/5 in all groups bilateral.  Significant pain on palpation throughout the lateral midtarsal joint of the right foot  Radiographic Exam:  Normal osseous mineralization.  Diffuse joint space narrowing noted consistent with generalized arthritis. No fracture/dislocation/boney destruction.    Assessment: 1.  Right midtarsal capsulitis right foot   Plan of Care:  1. Patient evaluated. X-Rays reviewed.  2.  Injection 0.5 cc Celestone Soluspan injected into the right midtarsal joint of the right foot 3.  Continue topical Voltaren that was provided to the [place] 4.  Continue wearing good  supportive shoes 5.  Return to clinic as needed      Edrick Kins, DPM [facility]  Dr. Edrick Kins, DPM    [address], [place] [address]                Office [phone]  Fax [phone]

## 2020-05-11 ENCOUNTER — Other Ambulatory Visit: Payer: Self-pay

## 2020-05-11 ENCOUNTER — Ambulatory Visit (INDEPENDENT_AMBULATORY_CARE_PROVIDER_SITE_OTHER): Payer: Medicare Other | Admitting: Podiatry

## 2020-05-11 DIAGNOSIS — M79675 Pain in left toe(s): Secondary | ICD-10-CM

## 2020-05-11 DIAGNOSIS — M79674 Pain in right toe(s): Secondary | ICD-10-CM

## 2020-05-11 DIAGNOSIS — M722 Plantar fascial fibromatosis: Secondary | ICD-10-CM

## 2020-05-11 DIAGNOSIS — B351 Tinea unguium: Secondary | ICD-10-CM

## 2020-05-11 MED ORDER — BETAMETHASONE SOD PHOS & ACET 6 (3-3) MG/ML IJ SUSP
3.0000 mg | Freq: Once | INTRAMUSCULAR | Status: AC
  Administered 2020-05-11: 3 mg via INTRA_ARTICULAR

## 2020-05-11 NOTE — Progress Notes (Signed)
   Subjective: 75 y.o. male for new complaint regarding right heel pain is been going on for the last few weeks. Patient cannot recall an injury or anything that would have aggravated his right heel. He has right heel pain and it is causing an antalgic gait and pain with walking. Patient is also requesting a nail trim today. He is unable to trim his nails and they are very symptomatic and sensitive especially in shoe gear.   Past Medical History:  Diagnosis Date  . Anxiety   . Arthritis   . Chronic pain syndrome   . CKD (chronic kidney disease) stage 3, GFR 30-59 ml/min   . Depression   . Diabetes mellitus without complication ([place])    Type II  . GERD (gastroesophageal reflux disease)   . Headache    Migraine- headache  . Hyperlipidemia   . Hypertension   . Kidney stones   . Peptic ulcer   . Sleep apnea    does not use cpap     Objective: Physical Exam General: The patient is alert and oriented x3 in no acute distress.  Dermatology: Skin is warm, dry and supple bilateral lower extremities. Negative for open lesions or macerations bilateral. Hyperkeratotic dystrophic nails noted 1-5 bilateral  Vascular: Dorsalis Pedis and Posterior Tibial pulses palpable bilateral.  Capillary fill time is immediate to all digits.  Neurological: Epicritic and protective threshold intact bilateral.   Musculoskeletal: Tenderness to palpation to the plantar aspect of the right heel along the plantar fascia. All other joints range of motion within normal limits bilateral. Strength 5/5 in all groups bilateral.   Assessment: 1. Plantar fasciitis right 2. Pain due to onychomycosis of toenail bilateral feet  Plan of Care:  1. Patient evaluated. Xrays reviewed.   2. Injection of 0.5cc Celestone soluspan injected into the right plantar fascia  3. Instructed patient regarding therapies and modalities at home to alleviate symptoms.  4. Mechanical debridement of nails 1-5 bilateral was performed using  a nail nipper without incident or bleeding 5. Recommend good supportive shoes and sneakers 6. Return to clinic in 3 months for routine foot care   Edrick Kins, DPM [facility]  Dr. Edrick Kins, DPM    [address], [place] [address]                Office [phone]  Fax [phone]

## 2020-06-17 ENCOUNTER — Other Ambulatory Visit: Payer: Self-pay | Admitting: Podiatry

## 2020-06-18 ENCOUNTER — Other Ambulatory Visit: Payer: Self-pay | Admitting: Podiatry

## 2020-06-18 NOTE — Telephone Encounter (Signed)
Please advise 

## 2020-06-29 ENCOUNTER — Ambulatory Visit: Payer: Medicare Other | Admitting: Occupational Therapy

## 2020-06-30 ENCOUNTER — Ambulatory Visit: Payer: Medicare Other | Admitting: Occupational Therapy

## 2020-07-01 ENCOUNTER — Other Ambulatory Visit: Payer: Self-pay

## 2020-07-01 ENCOUNTER — Ambulatory Visit: Payer: Medicare Other | Attending: Orthopedic Surgery | Admitting: Occupational Therapy

## 2020-07-01 ENCOUNTER — Encounter: Payer: Self-pay | Admitting: Occupational Therapy

## 2020-07-01 DIAGNOSIS — M65352 Trigger finger, left little finger: Secondary | ICD-10-CM | POA: Diagnosis present

## 2020-07-01 DIAGNOSIS — M79642 Pain in left hand: Secondary | ICD-10-CM | POA: Diagnosis not present

## 2020-07-01 NOTE — Therapy (Signed)
[facility] [address] [place], [address] Phone: [phone]   Fax:  [phone]  Occupational Therapy Evaluation  Patient Details  Name: Xavier Hebert MRN: [id number] Date of Birth: 06-08-1945 Referring Provider (OT): Dr Rudene Christians   Encounter Date: 07/01/2020   OT End of Session - 07/01/20 2045    Visit Number 1    Number of Visits 3    Date for OT Re-Evaluation 07/29/20    OT Start Time 1450    OT Stop Time 1540    OT Time Calculation (min) 50 min    Activity Tolerance Patient tolerated treatment well    Behavior During Therapy [facility] for tasks assessed/performed           Past Medical History:  Diagnosis Date  . Anxiety   . Arthritis   . Chronic pain syndrome   . CKD (chronic kidney disease) stage 3, GFR 30-59 ml/min ([facility])   . Depression   . Diabetes mellitus without complication ([place])    Type II  . GERD (gastroesophageal reflux disease)   . Headache    Migraine- headache  . Hyperlipidemia   . Hypertension   . Kidney stones   . Peptic ulcer   . Sleep apnea    does not use cpap    Past Surgical History:  Procedure Laterality Date  . ANTERIOR CERVICAL DECOMP/DISCECTOMY FUSION  2009  . CARPAL TUNNEL RELEASE Left 01/03/2018   Procedure: CARPAL TUNNEL RELEASE;  Surgeon: Hessie Knows, MD;  Location: [facility] ORS;  Service: Orthopedics;  Laterality: Left;  . COLONOSCOPY WITH PROPOFOL N/A 11/21/2017   Procedure: COLONOSCOPY WITH PROPOFOL;  Surgeon: Toledo, Benay Pike, MD;  Location: [facility] ENDOSCOPY;  Service: Gastroenterology;  Laterality: N/A;  . CYSTOSCOPY     x3- 2 times for stone removal   . ESOPHAGOGASTRODUODENOSCOPY N/A 10/14/2017   Procedure: ESOPHAGOGASTRODUODENOSCOPY (EGD);  Surgeon: Lin Landsman, MD;  Location: [facility] ENDOSCOPY;  Service: Gastroenterology;  Laterality: N/A;  . ESOPHAGOGASTRODUODENOSCOPY (EGD) WITH PROPOFOL N/A 10/25/2017   Procedure: ESOPHAGOGASTRODUODENOSCOPY (EGD) WITH  PROPOFOL;  Surgeon: Lin Landsman, MD;  Location: [facility] ENDOSCOPY;  Service: Gastroenterology;  Laterality: N/A;  . JOINT REPLACEMENT     bil. knees , shoulder rt, and neck   . REVERSE SHOULDER ARTHROPLASTY Right 12/01/2014  . REVERSE SHOULDER ARTHROPLASTY Right 12/01/2014   Procedure: REVERSE SHOULDER ARTHROPLASTY;  Surgeon: Meredith Pel, MD;  Location: [facility];  Service: Orthopedics;  Laterality: Right;  . SHOULDER ARTHROSCOPY W/ ROTATOR CUFF REPAIR Right 06/10/2014   "failed"  . SHOULDER OPEN ROTATOR CUFF REPAIR Left ~ 2008  . 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 TRIGGER FINGER RELEASE Right 2001  . URETERAL STENT PLACEMENT      There were no vitals filed for this visit.   Subjective Assessment - 07/01/20 2034    Subjective  My thumb had been bothering me for about year or 2- had shot in my thumb before- has bad arthritis -and then my pinkie locks and hurt at times - but not all the time- pain in my thumb can get up to 10/10 -I do like to play guitar, banjo and mandelin - about 4 hrs on Sundays- but some times it hurts so bad -I have to stop    Pertinent History Pt seend 05/25/20 Dr Rudene Christians - with pain in thumb and 5th digit trigger finger- play guitar, mandalin, and banjo - Xray showed -AP, lateral, and oblique x-rays of  the left hand were ordered and personally reviewed today. These show severe thumb CMC osteoarthritis, joint subluxation, complete destruction of the thumb CMC joint with subchondral cysts on both sides of the joint, extensive osteophytes and joint subluxation, slight scapholunate widening.    X-ray Impression  Advanced left thumb CMC osteoarthritis.    Assessment:  ICD-10-CM   1. 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 Pain in joints of left hand M25.542   3. 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    We discussed the patient's x-ray findings. I explained he has advanced left thumb CMC osteoarthritis. I recommend a left thumb CMC injection today. I also recommend he see a hand therapist to work on motion exercises. If this does not improve thing we may need to follow up to discuss surgery    Patient Stated Goals Want the pain better so I can play my instruments    Currently in Pain? Yes    Pain Score 10-Worst pain ever   at rest 2/10 - after using a lot increase to 10/10   Pain Location Hand    Pain Orientation Right    Pain Descriptors / Indicators Aching;Tender    Pain Type Chronic pain    Pain Onset More than a month ago    Pain Frequency Intermittent             [facility] OT Assessment - 07/01/20 0001      Assessment   Medical Diagnosis L thumb CMC DJD, 5th trigger finger    Referring Provider (OT) Dr Rudene Christians    Onset Date/Surgical Date --   2 yrs ago gradually worse   Hand Dominance Right    Next MD Visit May 9th      Home  Environment   Lives With Alone      Prior Function   Vocation Retired    Leisure likes to play string instruement - some cooking , on Iphone      AROM   Overall AROM Comments AROM in digits WNL - opposition WNL - pain in thumb - PA and RA WNL      Strength   Right Hand Grip (lbs) 74    Right Hand Lateral Pinch 20 lbs    Right Hand 3 Point Pinch 16 lbs    Left Hand Grip (lbs) 50   pain 6/10 - less pain with CMC neoprene  increase to 62 lbs   Left Hand Lateral Pinch 20 lbs    Left Hand 3 Point Pinch 16 lbs            Pt fitted with CMC neoprene splint for use with activities that cause pain -and MC block splint - night time and activities that use composite fist   using with play string instruments  Pt ed on joint protection and modifications- and hand out provided   can do ice massage during day over 5th [facility] A1 pulley                OT Education - 07/01/20 2045    Education Details findings of eval and HEP     Person(s) Educated Patient    Methods Explanation;Demonstration;Tactile cues;Verbal cues;Handout    Comprehension Verbal cues required;Returned demonstration;Verbalized understanding  OT Long Term Goals - 07/01/20 2052      OT LONG TERM GOAL #1   Title Pt to be independent in use of splints to decrease pain in thumb and triggering of L 5th digit    Baseline pain can increase to 10/10 and trigger finger /pain - tenderness over A1pulley 8/10    Time 4    Period Weeks    Status New    Target Date 07/29/20      OT LONG TERM GOAL #2   Title Pt to verbalize 3 joint protection and modifications of tasks to decrease pain and triggering in L hand    Baseline no knowledge - pain can increaes to 10/10    Time 2    Period Weeks    Status New    Target Date 07/15/20                 Plan - 07/01/20 2047    Clinical Impression Statement Pt present at OT eval with L thumb CMC DJD and 5th trigger finger - pt pain can increase to 10/10 with use - pt AROM WNL but pain with opposition -and grip and prehension WNL for his age - but increase pain with grip and 3 point - pt report pain in thumb for few years and had some shots at thumb CMC - but pain returns every time- trigger finger some days worse than other days- pt do play string instruments- review with pt modifications , joint protections- causes for trigger finger and increase pain in thumb- pt fitted with CMC neoprene splint to use with activities that cause thumb pain -and MC block for 5th to decrease composite fist night time and functional use during day- pt to do homeprogram for about 10 days and return for follow up    OT Occupational Profile and History Problem Focused Assessment - Including review of records relating to presenting problem    Occupational performance deficits (Please refer to evaluation for details): ADL's;IADL's;Play    Body Structure / Function / Physical Skills ADL;Strength;Pain;IADL    Rehab  Potential Fair    Clinical Decision Making Limited treatment options, no task modification necessary    Comorbidities Affecting Occupational Performance: May have comorbidities impacting occupational performance   chroic condition   Modification or Assistance to Complete Evaluation  No modification of tasks or assist necessary to complete eval    OT Frequency Biweekly    OT Duration 4 weeks    OT Treatment/Interventions Self-care/ADL training;Paraffin;Manual Therapy;Patient/family education;Therapeutic exercise;Iontophoresis;Splinting;DME and/or AE instruction    Consulted and Agree with Plan of Care Patient           Patient will benefit from skilled therapeutic intervention in order to improve the following deficits and impairments:   Body Structure / Function / Physical Skills: ADL,Strength,Pain,IADL       Visit Diagnosis: Pain in left hand - Plan: Ot plan of care cert/re-cert  Trigger finger, left little finger - Plan: Ot plan of care cert/re-cert    Problem List Patient Active Problem List   Diagnosis Date Noted  . Dysphagia 10/13/2017  . Enthesopathy of ankle and tarsus 08/22/2015  . Capsulitis 08/22/2015  . Diabetes ([place]) 03/10/2015  . 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 Coronary artery disease 09/23/2013  . HTN (hypertension) 09/12/2013  . Hyperlipidemia, unspecified 09/12/2013  . Migraines 09/12/2013  . Sleep apnea 09/12/2013    Rosalyn Gess OTR/L,CLT 07/01/2020, 8:55 PM  [facility] [address] [place], [address] Phone: [phone]   Fax:  [phone]  Name: ANTHONY TAMBURO MRN: [id number] Date of Birth: 01-12-1946

## 2020-07-06 ENCOUNTER — Other Ambulatory Visit: Payer: Self-pay

## 2020-07-06 ENCOUNTER — Ambulatory Visit (INDEPENDENT_AMBULATORY_CARE_PROVIDER_SITE_OTHER): Payer: Medicare Other | Admitting: Podiatry

## 2020-07-06 DIAGNOSIS — M722 Plantar fascial fibromatosis: Secondary | ICD-10-CM | POA: Diagnosis not present

## 2020-07-06 DIAGNOSIS — I872 Venous insufficiency (chronic) (peripheral): Secondary | ICD-10-CM

## 2020-07-07 MED ORDER — BETAMETHASONE SOD PHOS & ACET 6 (3-3) MG/ML IJ SUSP: 3.0000 mg | Freq: Once | INTRAMUSCULAR | Status: DC

## 2020-07-07 NOTE — Progress Notes (Signed)
   Subjective: 75 y.o. male for follow-up evaluation of plantar fasciitis to the right foot.  Patient states that the injection helped significantly however he is getting some recurrent pain.  He continues to have swelling to bilateral legs which has been chronic for several years.  He presents for further treatment and evaluation  Past Medical History:  Diagnosis Date  . Anxiety   . Arthritis   . Chronic pain syndrome   . CKD (chronic kidney disease) stage 3, GFR 30-59 ml/min ([facility])   . Depression   . Diabetes mellitus without complication ([place])    Type II  . GERD (gastroesophageal reflux disease)   . Headache    Migraine- headache  . Hyperlipidemia   . Hypertension   . Kidney stones   . Peptic ulcer   . Sleep apnea    does not use cpap     Objective: Physical Exam General: The patient is alert and oriented x3 in no acute distress.  Dermatology: Skin is warm, dry and supple bilateral lower extremities. Negative for open lesions or macerations bilateral. Hyperkeratotic dystrophic nails noted 1-5 bilateral  Vascular: Dorsalis Pedis and Posterior Tibial pulses palpable bilateral.  Capillary fill time is immediate to all digits.  Hemosiderin discoloration noted around the ankle goiters bilateral  Neurological: Epicritic and protective threshold intact bilateral.   Musculoskeletal: Tenderness to palpation to the plantar aspect of the right heel along the plantar fascia. All other joints range of motion within normal limits bilateral. Strength 5/5 in all groups bilateral.   Assessment: 1. Plantar fasciitis right 2.  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 Patient evaluated. Xrays reviewed.   2. Injection of 0.5cc Celestone soluspan injected into the right plantar fascia  3. Instructed patient regarding therapies and modalities at home to alleviate symptoms.  4.  Recommend good supportive shoes and sneakers 5.  Recommend compression hose  below-knee daily  6.  Return to clinic in 3 months for routine foot care   Edrick Kins, DPM [facility]  Dr. Edrick Kins, DPM    [address], [place] [address]                Office [phone]  Fax [phone]

## 2020-07-13 ENCOUNTER — Other Ambulatory Visit: Payer: Self-pay

## 2020-07-13 ENCOUNTER — Ambulatory Visit: Payer: Medicare Other | Admitting: Occupational Therapy

## 2020-07-13 DIAGNOSIS — M79642 Pain in left hand: Secondary | ICD-10-CM | POA: Diagnosis not present

## 2020-07-13 DIAGNOSIS — M65352 Trigger finger, left little finger: Secondary | ICD-10-CM

## 2020-07-13 NOTE — Therapy (Signed)
[name] [address], [place], [address] Phone: [phone]   Fax:  [phone]  Occupational Therapy Treatment  Patient Details  Name: Xavier Hebert MRN: [id number] Date of Birth: 1945-07-01 Referring Provider (OT): Dr Rudene Christians   Encounter Date: 07/13/2020   OT End of Session - 07/13/20 2019    Visit Number 2    Number of Visits 2    Date for OT Re-Evaluation 07/13/20    OT Start Time 6283    OT Stop Time 1528    OT Time Calculation (min) 31 min    Activity Tolerance Patient tolerated treatment well    Behavior During Therapy [facility] for tasks assessed/performed           Past Medical History:  Diagnosis Date  . Anxiety   . Arthritis   . Chronic pain syndrome   . CKD (chronic kidney disease) stage 3, GFR 30-59 ml/min (HCC)   . Depression   . Diabetes mellitus without complication ([place])    Type II  . GERD (gastroesophageal reflux disease)   . Headache    Migraine- headache  . Hyperlipidemia   . Hypertension   . Kidney stones   . Peptic ulcer   . Sleep apnea    does not use cpap    Past Surgical History:  Procedure Laterality Date  . ANTERIOR CERVICAL DECOMP/DISCECTOMY FUSION  2009  . CARPAL TUNNEL RELEASE Left 01/03/2018   Procedure: CARPAL TUNNEL RELEASE;  Surgeon: Hessie Knows, MD;  Location: [facility] ORS;  Service: Orthopedics;  Laterality: Left;  . COLONOSCOPY WITH PROPOFOL N/A 11/21/2017   Procedure: COLONOSCOPY WITH PROPOFOL;  Surgeon: Toledo, Benay Pike, MD;  Location: [facility] ENDOSCOPY;  Service: Gastroenterology;  Laterality: N/A;  . CYSTOSCOPY     x3- 2 times for stone removal   . ESOPHAGOGASTRODUODENOSCOPY N/A 10/14/2017   Procedure: ESOPHAGOGASTRODUODENOSCOPY (EGD);  Surgeon: Lin Landsman, MD;  Location: [facility] ENDOSCOPY;  Service: Gastroenterology;  Laterality: N/A;  . ESOPHAGOGASTRODUODENOSCOPY (EGD) WITH PROPOFOL N/A 10/25/2017   Procedure: ESOPHAGOGASTRODUODENOSCOPY (EGD) WITH  PROPOFOL;  Surgeon: Lin Landsman, MD;  Location: [facility] ENDOSCOPY;  Service: Gastroenterology;  Laterality: N/A;  . JOINT REPLACEMENT     bil. knees , shoulder rt, and neck   . REVERSE SHOULDER ARTHROPLASTY Right 12/01/2014  . REVERSE SHOULDER ARTHROPLASTY Right 12/01/2014   Procedure: REVERSE SHOULDER ARTHROPLASTY;  Surgeon: Meredith Pel, MD;  Location: [facility];  Service: Orthopedics;  Laterality: Right;  . SHOULDER ARTHROSCOPY W/ ROTATOR CUFF REPAIR Right 06/10/2014   "failed"  . SHOULDER OPEN ROTATOR CUFF REPAIR Left ~ 2008  . 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 TRIGGER FINGER RELEASE Right 2001  . URETERAL STENT PLACEMENT      There were no vitals filed for this visit.   Subjective Assessment - 07/13/20 2017    Subjective  This little black splint helps my thumb alot - pain better with it on - but the pinkie splint did not work with string instruments and then I lost it    Pertinent History Pt seend 05/25/20 Dr Rudene Christians - with pain in thumb and 5th digit trigger finger- play guitar, mandalin, and banjo - Xray showed -AP, lateral, and oblique x-rays of the left hand were ordered and personally reviewed today. These show severe thumb CMC osteoarthritis, joint subluxation, complete destruction of the thumb CMC joint with subchondral cysts on both sides of the joint, extensive osteophytes and joint subluxation, slight scapholunate  widening.    X-ray Impression  Advanced left thumb CMC osteoarthritis.    Assessment:  ICD-10-CM   1. 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 Pain in joints of left hand M25.542   3. Acquired trigger finger of left little finger M65.352     Plan:  The patient has clinical findings of advanced left thumb CMC osteoarthritis and left small and small finger trigger fingers.    We discussed the patient's x-ray findings. I explained he has advanced left thumb CMC osteoarthritis. I recommend a left thumb CMC injection today.  I also recommend he see a hand therapist to work on motion exercises. If this does not improve thing we may need to follow up to discuss surgery    Patient Stated Goals Want the pain better so I can play my instruments    Currently in Pain? No/denies              [facility] OT Assessment - 07/13/20 0001      Strength   Right Hand Grip (lbs) 74    Right Hand Lateral Pinch 20 lbs    Right Hand 3 Point Pinch 16 lbs    Left Hand Grip (lbs) 64   CMC splint on, no pain , OFF splint 55 lbs   Left Hand Lateral Pinch 20 lbs    Left Hand 3 Point Pinch 16 lbs           Pt arrive with report of using CMC neoprene splint resulting in less pain - but still pain without it      Pt to cont to use CMC neoprene splint with activities that cause pain - Pt lost and did not found that MC block splint for 5th digit helped during activities - review with pt again - it will not help with string instruments - but to wear it at night time and activities that use composite fist  Re fabricate MC block splint for 5th   Review with pt again joint protection and modifications- and hand out provided last time  can do ice massage during day over 5th MC A1 pulley  But did not had pain or tenderness this date - but cont to trigger with composite fist  Pt to follow up with Dr Rudene Christians in 2 wks ? Surgery                OT Education - 07/13/20 2019    Education Details progress and HEP/splint wearing    Person(s) Educated Patient    Methods Explanation;Demonstration;Tactile cues;Verbal cues;Handout    Comprehension Verbal cues required;Returned demonstration;Verbalized understanding               OT Long Term Goals - 07/13/20 2030      OT LONG TERM GOAL #1   Title Pt to be independent in use of splints to decrease pain in thumb and triggering of L 5th digit    Baseline pain can increase to 10/10 and trigger finger /pain - tenderness over A1pulley 8/10- NOW no tenderness at 5th -but trigger still -  thumb CMC pain decreas to 4-6/10    Status Achieved      OT LONG TERM GOAL #2   Title Pt to verbalize 3 joint protection and modifications of tasks to decrease pain and triggering in L hand    Baseline no knowledge - pain can increaes to 10/10- pt verbalize and pain did decrease to 6/10 - cont to have pain  without splint use    Status Achieved                 Plan - 07/13/20 2020    Clinical Impression Statement Pt refer to OT with L thumb CMC DJD and 5th trigger finger - pt pain can increase to 10/10 with use - pt AROM WNL but pain with opposition -and grip and prehension WNL for his age - but increase pain with grip and 3 point - pt report pain in thumb for few years and had some shots at thumb CMC -Pt was fitted with CMC neoprene splint last week  and report very little pain with use of CMC splint - but cont to have pain without it- grip increase by about 12 lbs without it on - trigger finger continues to be issue at 5th digit- refabricated MC block splint for pt to use night time when worse and with composite fist acvitiies- pt to cont with homeprogram and follow up with Dr Rudene Christians in 2 wks to discuss if wants surgery - pt do play string instruments- review with pt modifications , joint protections again- causes for trigger finger and increase pain in thumb    OT Occupational Profile and History Problem Focused Assessment - Including review of records relating to presenting problem    Occupational performance deficits (Please refer to evaluation for details): ADL's;IADL's;Play    Body Structure / Function / Physical Skills ADL;Strength;Pain;IADL    Rehab Potential Fair    Clinical Decision Making Limited treatment options, no task modification necessary    Comorbidities Affecting Occupational Performance: May have comorbidities impacting occupational performance    Modification or Assistance to Complete Evaluation  No modification of tasks or assist necessary to complete eval    OT  Treatment/Interventions Self-care/ADL training;Paraffin;Manual Therapy;Patient/family education;Therapeutic exercise;Iontophoresis;Splinting;DME and/or AE instruction    Consulted and Agree with Plan of Care Patient           Patient will benefit from skilled therapeutic intervention in order to improve the following deficits and impairments:   Body Structure / Function / Physical Skills: ADL,Strength,Pain,IADL       Visit Diagnosis: Pain in left hand  Trigger finger, left little finger    Problem List Patient Active Problem List   Diagnosis Date Noted  . Dysphagia 10/13/2017  . Enthesopathy of ankle and tarsus 08/22/2015  . Capsulitis 08/22/2015  . Diabetes ([place]) 03/10/2015  . 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 Coronary artery disease 09/23/2013  . HTN (hypertension) 09/12/2013  . Hyperlipidemia, unspecified 09/12/2013  . Migraines 09/12/2013  . Sleep apnea 09/12/2013    Rosalyn Gess OTR/L,CLT 07/13/2020, 8:33 PM  [facility] [address], [place], [address] Phone: [phone]   Fax:  [phone]  Name: Xavier Hebert MRN: [id number] Date of Birth: 1946-03-10

## 2020-07-29 ENCOUNTER — Other Ambulatory Visit: Payer: Self-pay | Admitting: Orthopedic Surgery

## 2020-08-06 ENCOUNTER — Encounter
Admission: RE | Admit: 2020-08-06 | Discharge: 2020-08-06 | Disposition: A | Discharge status: Home / Self Care | Payer: Medicare Other | Source: Ambulatory Visit | Attending: Orthopedic Surgery | Admitting: Orthopedic Surgery

## 2020-08-06 ENCOUNTER — Other Ambulatory Visit: Payer: Self-pay

## 2020-08-06 NOTE — Patient Instructions (Signed)
Your procedure is scheduled on: 08/10/20 Report to [facility]. To find out your arrival time please call [phone] between 1PM - 3PM on 08/09/20.  Remember: Instructions that are not followed completely may result in serious medical risk, up to and including death, or upon the discretion of your surgeon and anesthesiologist your surgery may need to be rescheduled.     _X__ 1. Do not eat food or drink liquids after midnight the night before your procedure.                   __X__2.  On the morning of surgery brush your teeth with toothpaste and water, you                 may rinse your mouth with mouthwash if you wish.  Do not swallow any              toothpaste of mouthwash.     _X__ 3.  No Alcohol for 24 hours before or after surgery.   _X__ 4.  Do Not Smoke or use e-cigarettes For 24 Hours Prior to Your Surgery.                 Do not use any chewable tobacco products for at least 6 hours prior to                 surgery.  ____  5.  Bring all medications with you on the day of surgery if instructed.   __X__  6.  Notify your doctor if there is any change in your medical condition      (cold, fever, infections).     Do not wear jewelry, make-up, hairpins, clips or nail polish. Do not wear lotions, powders, or perfumes.  Do not shave 48 hours prior to surgery. Men may shave face and neck. Do not bring valuables to the hospital.    [facility] is not responsible for any belongings or valuables.  Contacts, dentures/partials or body piercings may not be worn into surgery. Bring a case for your contacts, glasses or hearing aids, a denture cup will be supplied. Leave your suitcase in the car. After surgery it may be brought to your room. For patients admitted to the hospital, discharge time is determined by your treatment team.   Patients discharged the day of surgery will not be allowed to drive home.   Please read over the  following fact sheets that you were given:     __X__ Take these medicines the morning of surgery with A SIP OF WATER:    1. allopurinol (ZYLOPRIM) 100 MG tablet  2. hydrOXYzine (ATARAX/VISTARIL) 50 MG tablet  3. methocarbamol (ROBAXIN) 750 MG tablet  4. oxyCODONE (ROXICODONE) 15 MG immediate release tablet  5. pantoprazole (PROTONIX) 40 MG tablet  6.  ____ Fleet Enema (as directed)   ____ Use CHG Soap/SAGE wipes as directed  ____ Use inhalers on the day of surgery  ____ Stop metformin/Janumet/Farxiga 2 days prior to surgery    __X__ Take 1/2 of usual insulin dose the night before surgery. No insulin the morning          of surgery.   ____ Stop Blood Thinners Coumadin/Plavix/Xarelto/Pleta/Pradaxa/Eliquis/Effient/Aspirin  on   Or contact your Surgeon, Cardiologist or Medical Doctor regarding  ability to stop your blood thinners  __X__ Stop Anti-inflammatories 7 days before surgery such as Advil, Ibuprofen, Motrin,  BC or Goodies Powder, Naprosyn,  Naproxen, Aleve, Aspirin    __X__ Stop all herbal supplements, fish oil or vitamin E until after surgery.    ____ Bring C-Pap to the hospital.     Telephone instructions to patient and significant other. Both verbalized understanding

## 2020-08-10 ENCOUNTER — Ambulatory Visit: Payer: Medicare Other

## 2020-08-10 ENCOUNTER — Ambulatory Visit: Payer: Medicare Other | Admitting: Anesthesiology

## 2020-08-10 ENCOUNTER — Encounter
Admission: RE | Disposition: A | Discharge status: Home / Self Care | Payer: Self-pay | Source: Ambulatory Visit | Attending: Orthopedic Surgery

## 2020-08-10 ENCOUNTER — Encounter: Payer: Self-pay | Admitting: Orthopedic Surgery

## 2020-08-10 ENCOUNTER — Other Ambulatory Visit: Payer: Self-pay

## 2020-08-10 ENCOUNTER — Ambulatory Visit
Admission: RE | Admit: 2020-08-10 | Discharge: 2020-08-10 | Disposition: A | Discharge status: Home / Self Care | Payer: Medicare Other | Source: Ambulatory Visit | Attending: Orthopedic Surgery | Admitting: Orthopedic Surgery

## 2020-08-10 ENCOUNTER — Ambulatory Visit: Payer: Medicare Other | Admitting: Podiatry

## 2020-08-10 DIAGNOSIS — Z79890 Hormone replacement therapy (postmenopausal): Secondary | ICD-10-CM | POA: Insufficient documentation

## 2020-08-10 DIAGNOSIS — Z79899 Other long term (current) drug therapy: Secondary | ICD-10-CM | POA: Insufficient documentation

## 2020-08-10 DIAGNOSIS — M65352 Trigger finger, left little finger: Secondary | ICD-10-CM | POA: Diagnosis present

## 2020-08-10 DIAGNOSIS — Z96653 Presence of artificial knee joint, bilateral: Secondary | ICD-10-CM | POA: Insufficient documentation

## 2020-08-10 DIAGNOSIS — M189 Osteoarthritis of first carpometacarpal joint, unspecified: Secondary | ICD-10-CM | POA: Diagnosis not present

## 2020-08-10 DIAGNOSIS — Z794 Long term (current) use of insulin: Secondary | ICD-10-CM | POA: Insufficient documentation

## 2020-08-10 DIAGNOSIS — Z7984 Long term (current) use of oral hypoglycemic drugs: Secondary | ICD-10-CM | POA: Insufficient documentation

## 2020-08-10 HISTORY — PX: CARPOMETACARPAL (CMC) FUSION OF THUMB: SHX6290

## 2020-08-10 HISTORY — PX: TRIGGER FINGER RELEASE: SHX641

## 2020-08-10 LAB — GLUCOSE, CAPILLARY
Glucose-Capillary: 98 mg/dL (ref 70–99)
Glucose-Capillary: 119 mg/dL — ABNORMAL HIGH (ref 70–99)

## 2020-08-10 SURGERY — CARPOMETACARPAL (CMC) FUSION OF THUMB
Anesthesia: General | Site: Thumb | Laterality: Left

## 2020-08-10 MED ORDER — FENTANYL CITRATE (PF) 100 MCG/2ML IJ SOLN
INTRAMUSCULAR | Status: AC
  Administered 2020-08-10: 50 ug via INTRAVENOUS
  Filled 2020-08-10: qty 2

## 2020-08-10 MED ORDER — OXYCODONE HCL 5 MG PO TABS
5.0000 mg | ORAL_TABLET | Freq: Once | ORAL | Status: AC | PRN
  Administered 2020-08-10: 5 mg via ORAL

## 2020-08-10 MED ORDER — CHLORHEXIDINE GLUCONATE 0.12 % MT SOLN
15.0000 mL | Freq: Once | OROMUCOSAL | Status: AC
  Administered 2020-08-10: 15 mL via OROMUCOSAL

## 2020-08-10 MED ORDER — PROMETHAZINE HCL 25 MG/ML IJ SOLN: 6.2500 mg | INTRAMUSCULAR | Status: DC | PRN

## 2020-08-10 MED ORDER — CEFAZOLIN SODIUM-DEXTROSE 2-4 GM/100ML-% IV SOLN
2.0000 g | INTRAVENOUS | Status: AC
  Administered 2020-08-10: 2 g via INTRAVENOUS

## 2020-08-10 MED ORDER — LIDOCAINE HCL (PF) 2 % IJ SOLN
INTRAMUSCULAR | Status: AC
  Filled 2020-08-10: qty 2

## 2020-08-10 MED ORDER — OXYCODONE HCL 5 MG PO TABS
ORAL_TABLET | ORAL | Status: AC
  Filled 2020-08-10: qty 1

## 2020-08-10 MED ORDER — ONDANSETRON HCL 4 MG/2ML IJ SOLN
INTRAMUSCULAR | Status: DC | PRN
  Administered 2020-08-10: 4 mg via INTRAVENOUS

## 2020-08-10 MED ORDER — PROPOFOL 10 MG/ML IV BOLUS
INTRAVENOUS | Status: DC | PRN
  Administered 2020-08-10: 150 mg via INTRAVENOUS

## 2020-08-10 MED ORDER — SODIUM CHLORIDE 0.9 % IV SOLN: INTRAVENOUS | Status: DC

## 2020-08-10 MED ORDER — BUPIVACAINE HCL (PF) 0.5 % IJ SOLN
INTRAMUSCULAR | Status: AC
  Filled 2020-08-10: qty 30

## 2020-08-10 MED ORDER — FENTANYL CITRATE (PF) 100 MCG/2ML IJ SOLN
INTRAMUSCULAR | Status: AC
  Filled 2020-08-10: qty 2

## 2020-08-10 MED ORDER — MIDAZOLAM HCL 2 MG/2ML IJ SOLN
INTRAMUSCULAR | Status: DC | PRN
  Administered 2020-08-10: 2 mg via INTRAVENOUS

## 2020-08-10 MED ORDER — FENTANYL CITRATE (PF) 100 MCG/2ML IJ SOLN
25.0000 ug | INTRAMUSCULAR | Status: DC | PRN
  Administered 2020-08-10 (×3): 25 ug via INTRAVENOUS

## 2020-08-10 MED ORDER — OXYCODONE HCL 5 MG/5ML PO SOLN: 5.0000 mg | Freq: Once | ORAL | Status: AC | PRN

## 2020-08-10 MED ORDER — CEFAZOLIN SODIUM-DEXTROSE 2-4 GM/100ML-% IV SOLN
INTRAVENOUS | Status: AC
  Filled 2020-08-10: qty 100

## 2020-08-10 MED ORDER — DEXAMETHASONE SODIUM PHOSPHATE 10 MG/ML IJ SOLN
INTRAMUSCULAR | Status: DC | PRN
  Administered 2020-08-10: 5 mg via INTRAVENOUS

## 2020-08-10 MED ORDER — GELATIN ABSORBABLE 12-7 MM EX MISC
CUTANEOUS | Status: DC | PRN
  Administered 2020-08-10: 1

## 2020-08-10 MED ORDER — LIDOCAINE HCL (PF) 1 % IJ SOLN
INTRAMUSCULAR | Status: AC
  Filled 2020-08-10: qty 30

## 2020-08-10 MED ORDER — DEXAMETHASONE SODIUM PHOSPHATE 10 MG/ML IJ SOLN
INTRAMUSCULAR | Status: AC
  Filled 2020-08-10: qty 1

## 2020-08-10 MED ORDER — FENTANYL CITRATE (PF) 100 MCG/2ML IJ SOLN
INTRAMUSCULAR | Status: AC
  Administered 2020-08-10: 25 ug via INTRAVENOUS
  Filled 2020-08-10: qty 2

## 2020-08-10 MED ORDER — PHENYLEPHRINE HCL (PRESSORS) 10 MG/ML IV SOLN
INTRAVENOUS | Status: DC | PRN
  Administered 2020-08-10: 100 ug via INTRAVENOUS
  Administered 2020-08-10: 200 ug via INTRAVENOUS
  Administered 2020-08-10: 100 ug via INTRAVENOUS
  Administered 2020-08-10: 200 ug via INTRAVENOUS

## 2020-08-10 MED ORDER — PROPOFOL 10 MG/ML IV BOLUS
INTRAVENOUS | Status: AC
  Filled 2020-08-10: qty 40

## 2020-08-10 MED ORDER — GELATIN ABSORBABLE 12-7 MM EX MISC
CUTANEOUS | Status: AC
  Filled 2020-08-10: qty 1

## 2020-08-10 MED ORDER — EPHEDRINE SULFATE 50 MG/ML IJ SOLN
INTRAMUSCULAR | Status: DC | PRN
  Administered 2020-08-10: 10 mg via INTRAVENOUS
  Administered 2020-08-10: 5 mg via INTRAVENOUS
  Administered 2020-08-10: 10 mg via INTRAVENOUS
  Administered 2020-08-10 (×3): 5 mg via INTRAVENOUS

## 2020-08-10 MED ORDER — CHLORHEXIDINE GLUCONATE 0.12 % MT SOLN
OROMUCOSAL | Status: AC
  Filled 2020-08-10: qty 15

## 2020-08-10 MED ORDER — MEPERIDINE HCL 25 MG/ML IJ SOLN: 6.2500 mg | INTRAMUSCULAR | Status: DC | PRN

## 2020-08-10 MED ORDER — BUPIVACAINE HCL (PF) 0.5 % IJ SOLN
INTRAMUSCULAR | Status: DC | PRN
  Administered 2020-08-10: 15 mL

## 2020-08-10 MED ORDER — OXYCODONE HCL 7.5 MG PO TABS: 7.5000 mg | ORAL_TABLET | ORAL | 0 refills | Status: AC | PRN

## 2020-08-10 MED ORDER — MIDAZOLAM HCL 2 MG/2ML IJ SOLN
INTRAMUSCULAR | Status: AC
  Filled 2020-08-10: qty 2

## 2020-08-10 MED ORDER — EPHEDRINE 5 MG/ML INJ
INTRAVENOUS | Status: AC
  Filled 2020-08-10: qty 10

## 2020-08-10 MED ORDER — ONDANSETRON HCL 4 MG/2ML IJ SOLN
INTRAMUSCULAR | Status: AC
  Filled 2020-08-10: qty 2

## 2020-08-10 MED ORDER — LIDOCAINE HCL (CARDIAC) PF 100 MG/5ML IV SOSY
PREFILLED_SYRINGE | INTRAVENOUS | Status: DC | PRN
  Administered 2020-08-10: 40 mg via INTRAVENOUS

## 2020-08-10 MED ORDER — ORAL CARE MOUTH RINSE: 15.0000 mL | Freq: Once | OROMUCOSAL | Status: AC

## 2020-08-10 SURGICAL SUPPLY — 44 items
APL PRP STRL LF DISP 70% ISPRP (MISCELLANEOUS) ×2
BLADE OSC/SAGITTAL 5.5X25 (BLADE) ×3 IMPLANT
BNDG CMPR STD VLCR NS LF 5.8X2 (GAUZE/BANDAGES/DRESSINGS) ×2
BNDG CMPR STD VLCR NS LF 5.8X3 (GAUZE/BANDAGES/DRESSINGS) ×2
BNDG CONFORM 3 STRL LF (GAUZE/BANDAGES/DRESSINGS) ×3 IMPLANT
BNDG ELASTIC 2X5.8 VLCR NS LF (GAUZE/BANDAGES/DRESSINGS) ×3 IMPLANT
BNDG ELASTIC 2X5.8 VLCR STR LF (GAUZE/BANDAGES/DRESSINGS) ×3 IMPLANT
BNDG ELASTIC 3X5.8 VLCR NS LF (GAUZE/BANDAGES/DRESSINGS) ×3 IMPLANT
CAST PADDING 2X4YD ST 30245 (MISCELLANEOUS) ×1
CAST PADDING 3X4FT ST 30246 (SOFTGOODS)
CHLORAPREP W/TINT 26 (MISCELLANEOUS) ×3 IMPLANT
COVER WAND RF STERILE (DRAPES) ×3 IMPLANT
CUFF TOURN SGL QUICK 18X4 (TOURNIQUET CUFF) ×3 IMPLANT
DRAPE FLUOR MINI C-ARM 54X84 (DRAPES) ×3 IMPLANT
ELECT CAUTERY BLADE 6.4 (BLADE) ×3 IMPLANT
GAUZE SPONGE 4X4 12PLY STRL (GAUZE/BANDAGES/DRESSINGS) ×3 IMPLANT
GAUZE XEROFORM 1X8 LF (GAUZE/BANDAGES/DRESSINGS) ×3 IMPLANT
GLOVE SURG SYN 9.0  PF PI (GLOVE) ×1
GLOVE SURG SYN 9.0 PF PI (GLOVE) ×2 IMPLANT
GOWN SRG 2XL LVL 4 RGLN SLV (GOWNS) ×2 IMPLANT
GOWN STRL NON-REIN 2XL LVL4 (GOWNS) ×3
GOWN STRL REUS W/ TWL LRG LVL3 (GOWN DISPOSABLE) ×2 IMPLANT
GOWN STRL REUS W/TWL LRG LVL3 (GOWN DISPOSABLE) ×3
KIT TURNOVER KIT A (KITS) ×3 IMPLANT
MANIFOLD NEPTUNE II (INSTRUMENTS) ×3 IMPLANT
NEEDLE HYPO 25X1 1.5 SAFETY (NEEDLE) ×3 IMPLANT
NS IRRIG 500ML POUR BTL (IV SOLUTION) ×3 IMPLANT
PACK EXTREMITY ARMC (MISCELLANEOUS) ×3 IMPLANT
PAD CAST CTTN 3X4 STRL (SOFTGOODS) IMPLANT
PADDING CAST COTTON 2X4 ST (MISCELLANEOUS) ×2 IMPLANT
PADDING CAST COTTON 3X4 STRL (SOFTGOODS)
SCALPEL PROTECTED #15 DISP (BLADE) ×6 IMPLANT
SPLINT CAST 1 STEP 3X12 (MISCELLANEOUS) IMPLANT
SPLINT WRIST M LT TX990308 (SOFTGOODS) IMPLANT
SPONGE GAUZE 2X2 8PLY STRL LF (GAUZE/BANDAGES/DRESSINGS) ×3 IMPLANT
SUT ETHILON 4 0 P 3 18 (SUTURE) ×3 IMPLANT
SUT ETHILON 4-0 (SUTURE) ×3
SUT ETHILON 4-0 FS2 18XMFL BLK (SUTURE) ×2
SUT VIC AB 0 CT2 27 (SUTURE) ×6 IMPLANT
SUT VIC AB 3-0 SH 27 (SUTURE) ×3
SUT VIC AB 3-0 SH 27X BRD (SUTURE) ×2 IMPLANT
SUTURE ETHLN 4-0 FS2 18XMF BLK (SUTURE) ×2 IMPLANT
SYSTEM IMPLANT TIGHTROPE MINI (Anchor) ×3 IMPLANT
WIRE Z .062 C-WIRE SPADE TIP (WIRE) ×6 IMPLANT

## 2020-08-10 NOTE — Anesthesia Preprocedure Evaluation (Signed)
Anesthesia Evaluation  Patient identified by MRN, date of birth, ID band Patient awake    Reviewed: Allergy & Precautions, NPO status , Patient's Chart, lab work & pertinent test results  History of Anesthesia Complications Negative for: history of anesthetic complications  Airway Mallampati: II  TM Distance: >3 FB Neck ROM: Full    Dental  (+) Partial Upper   Pulmonary sleep apnea , neg COPD,    breath sounds clear to auscultation- rhonchi (-) wheezing      Cardiovascular hypertension, Pt. on medications (-) CAD, (-) Past MI, (-) Cardiac Stents and (-) CABG  Rhythm:Regular Rate:Normal - Systolic murmurs and - Diastolic murmurs    Neuro/Psych  Headaches, neg Seizures PSYCHIATRIC DISORDERS Anxiety Depression    GI/Hepatic Neg liver ROS, PUD, GERD  ,  Endo/Other  diabetes, Insulin Dependent  Renal/GU CRFRenal disease     Musculoskeletal  (+) Arthritis ,   Abdominal (+) - obese,   Peds  Hematology negative hematology ROS (+)   Anesthesia Other Findings Past Medical History: No date: Anxiety No date: Arthritis No date: Chronic pain syndrome No date: CKD (chronic kidney disease) stage 3, GFR 30-59 ml/min (HCC) No date: Depression No date: 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 No date: GERD (gastroesophageal reflux disease) No date: Headache     Comment:  Migraine- headache No date: Hyperlipidemia No date: Hypertension No date: Kidney stones No date: Peptic ulcer No date: Sleep apnea     Comment:  does not use cpap/ pt states he lost 60#   Reproductive/Obstetrics                             Anesthesia Physical Anesthesia Plan  ASA: II  Anesthesia Plan: General   Post-op Pain Management:    Induction: Intravenous  PONV Risk Score and Plan: 1 and Ondansetron and Dexamethasone  Airway Management Planned: LMA  Additional Equipment:   Intra-op  Plan:   Post-operative Plan:   Informed Consent: I have reviewed the patients History and Physical, chart, labs and discussed the procedure including the risks, benefits and alternatives for the proposed anesthesia with the patient or authorized representative who has indicated his/her understanding and acceptance.     Dental advisory given  Plan Discussed with: CRNA and Anesthesiologist  Anesthesia Plan Comments:         Anesthesia Quick Evaluation

## 2020-08-10 NOTE — Anesthesia Procedure Notes (Signed)
Procedure Name: LMA Insertion Date/Time: 08/10/2020 1:52 PM Performed by: Nelda Marseille, CRNA Patient Re-evaluated:Patient Re-evaluated prior to induction Oxygen Delivery Method: Circle system utilized Preoxygenation: Pre-oxygenation with 100% oxygen Induction Type: IV induction Ventilation: Mask ventilation without difficulty and Mask ventilation throughout procedure LMA: LMA inserted LMA Size: 5.0 Airway Equipment and Method: Bite block Placement Confirmation: positive ETCO2 Tube secured with: Tape Dental Injury: Teeth and Oropharynx as per pre-operative assessment

## 2020-08-10 NOTE — H&P (Signed)
History of the Present Illness: Xavier Hebert is a 75 y.o. male here today.   The patient presents for follow-up evaluation of a left thumb and small finger trigger. I saw him last back in 05/2020 with thumb CMC arthritis and trigger fingers. He has gone through hand therapy, a few sessions with [employer], OTR/L, CLT at [facility]. She also made a splint for him. He comes back today for follow-up of the arthritis and trigger fingers.  The patient states his left thumb trigger finger is still present. He reports his left thumb is very painful, and he is ready for surgery.   The patient has had bilateral knee arthroplasties and reverse shoulder arthroplasties.  I have reviewed past medical, surgical, social and family history, and allergies as documented in the EMR.  Past Medical History: Past Medical History:  Diagnosis Date  . Chronic pain syndrome  . Coronary artery disease  . Diabetes mellitus type 2, uncomplicated (CMS-HCC)  . Hyperlipidemia  . Hypertension  . Migraines  . Sleep apnea   Past Surgical History: Past Surgical History:  Procedure Laterality Date  . ARTHROSCOPIC ROTATOR CUFF REPAIR  . CARPAL TUNNEL RELEASE (Left) Left 01/03/2018  Hessie Knows, MD  . COLONOSCOPY 11/21/2017  Tubular adenoma of the colon/Hyperplastic colon polyp/Repeat 50yrs/TKT  . INCISION TENDON SHEATH FOR TRIGGER FINGER Right 2001  . KNEE ARTHROSCOPY 2005; 2006; 2008  Left knee/revisions  . KNEE ARTHROSCOPY 05/07/03  Right knee  . Left Carpal Tunnel Release 01/03/18 Left 01/03/2018  Hessie Knows, MD  . Microdiskectomy 02/16/04  L4-5  . Neck fusion 2009   Past Family History: Family History  Problem Relation Age of Onset  . Alzheimer's disease Mother  . Myocardial Infarction (Heart attack) Father  . Fibromyalgia Sister  . Stroke Brother   Medications: Current Outpatient Medications Ordered in Epic  Medication Sig Dispense Refill  . allopurinol (ZYLOPRIM) 100 MG tablet Take 100  mg by mouth once daily  . amLODIPine (NORVASC) 5 MG tablet Take 5 mg by mouth once daily  . ammonium lactate (LAC-HYDRIN) 12 % lotion Apply topically as directed  . butalbital-acetaminophen-caffeine (FIORICET) 50-325-40 mg tablet Take 1 tablet by mouth every 4 (four) hours as needed for Pain.  . cholecalciferol (VITAMIN D3) 1000 unit tablet Take 2 tablets by mouth once daily  . clobetasol (TEMOVATE) 0.05 % ointment Apply topically 2 (two) times daily.  . coconut oil 1,000 mg Cap Take 1,000 mg by mouth once daily  . colchicine (COLCRYS) 0.6 mg tablet Take 0.6 mg by mouth once daily as needed  . empagliflozin (JARDIANCE) 25 mg tablet Take 12.5 mg by mouth once daily  . flunisolide (NASALIDE) 25 mcg (0.025 %) nasal spray  . glucosam/chon-msm1/C/mang/bosw (OSTEO BI-FLEX TRIPLE STRENGTH ORAL) Take by mouth  . hydrocortisone 2.5 % cream Apply topically once daily.  . hydrOXYzine (ATARAX) 50 MG tablet Take 50 mg by mouth 4 (four) times daily as needed  . insulin ASPART (NOVOLOG FLEXPEN) pen injector (concentration 100 units/mL) Novolog Flexpen U-100 Insulin aspart 100 unit/mL (3 mL) subcutaneous 3-5 units with meals  . ketoconazole (NIZORAL) 2 % shampoo  . lidocaine (LIDODERM) 5 % patch Place 1 patch onto the skin daily Apply patch to the most painful area for up to 12 hours in a 24 hour period.  Marland Kitchen lisinopril (PRINIVIL,ZESTRIL) 40 MG tablet Take 40 mg by mouth once daily  . magnesium oxide (MAG-OX) 400 mg (241.3 mg magnesium) tablet Take 400 mg by mouth once daily  .  methocarbamol (ROBAXIN) 750 MG tablet Take 750 mg by mouth 4 (four) times daily.  . MULTIVITAMIN ORAL Take 1 tablet by mouth once daily.  . mupirocin (BACTROBAN) 2 % ointment  . oxyCODONE (ROXICODONE) 15 MG immediate release tablet Take 15 mg by mouth every 6 (six) hours as needed for Pain.  . pantoprazole (PROTONIX) 20 MG DR tablet Take 20 mg by mouth once daily  . rosuvastatin (CRESTOR) 10 MG tablet Take 1 tablet (10 mg total) by mouth  once daily 30 tablet 11  . saw palmetto 80 MG capsule Take 80 mg by mouth once daily.  . semaglutide (OZEMPIC) 0.25 mg or 0.5 mg(2 mg/1.5 mL) pen injector Inject subcutaneously as directed  . sildenafil, antihypertensive, (REVATIO) 20 mg tablet Take 20 mg by mouth 3 (three) times daily  . testosterone 30 mg/actuation (1.5 mL) SlPm 1.5 mLs 2 (two) times daily  . timolol maleate (TIMOPTIC) 0.25 % ophthalmic solution Place 2 drops into the left eye once daily  . TRUEPLUS PEN NEEDLE 31 gauge x 5/16" needle  . tryptophan 500 mg Cap Take 500 mg by mouth daily  . BASAGLAR KWIKPEN U-100 INSULIN pen injector (concentration 100 units/mL) Take 40 units subcutaneously each evening at 10 pm (Patient not taking: Reported on 07/26/2020) 15 mL 5  . BD ULTRA-FINE NANO PEN NEEDLE 32 gauge x 5/32" Ndle (Patient not taking: Reported on 07/26/2020)  . flash glucose scanning (FREESTYLE LIBRE 14 DAY) reader . Use to test blood sugar (Patient not taking: Reported on 07/26/2020) 1 each 0  . flash glucose sensor (FREESTYLE LIBRE 14 DAY SENSOR) kit Use 1 kit every 14 (fourteen) days (Patient not taking: Reported on 07/26/2020) 2 kit 5   No current Epic-ordered facility-administered medications on file.   Allergies: Allergies  Allergen Reactions  . Iodine And Iodide Containing Products Anaphylaxis and Other (See Comments)  Breathing difficulty and chest pain  . Nsaids (Non-Steroidal Anti-Inflammatory Drug) Abdominal Pain  Other reaction(s): Abdominal pain Other reaction(s): GI Intolerance Other reaction(s): Abdominal pain  . Amlodipine Other (See Comments)  . Aspirin Unknown and Other (See Comments)  Other reaction(s): NAUSEA,VOMITING Other reaction(s): Other (See Comments) Stomach pain  . Gabapentin Dizziness  . Glipizide Other (See Comments)  Other reaction(s): Chest pain  . Hydrochlorothiazide Other (See Comments)  Other reaction(s): Impotence  . Ibuprofen Other (See Comments)  Stomach pain Other reaction(s): GI  Intolerance, ITCHING,WATERING EYES Stomach pain  . Iodinated Contrast Media Rash  . Ioxaglic Acid Other (See Comments)  Breathing difficulty and chest pain Other reaction(s): Other (See Comments) Breathing difficulty and chest pain Breathing difficulty and chest pain Breathing difficulty and chest pain  . Latex Itching  . Metformin Diarrhea  . Metrizamide Other (See Comments)  Breathing difficulty and chest pain  . Omeprazole Other (See Comments)  Other reaction(s): FAILED THERAPY  . Other Anxiety and Other (See Comments)  . Pregabalin Dizziness and Other (See Comments)  Other reaction(s): Dizziness Other reaction(s): Dizziness  . Quinine Other (See Comments)  Other reaction(s): SHORTNESS OF BREATH  . Quinine Sulfate Other (See Comments)  Other reaction(s): SHORTNESS OF BREATH  . Pioglitazone Abdominal Pain, Diarrhea and Other (See Comments)  Other reaction(s): Diarrhea, Abdominal pain Other reaction(s): Diarrhea, Abdominal pain    Body mass index is 27.04 kg/m.  Review of Systems: A comprehensive 14 point ROS was performed, reviewed, and the pertinent orthopaedic findings are documented in the HPI.  Vitals:  07/26/20 0849  BP: 122/76    General Physical Examination:  General/Constitutional: No apparent distress: well-nourished and well developed. Eyes: Pupils equal, round with synchronous movement. Lungs: Clear to auscultation HEENT: Normal, with dentition notable for an upper plate.. Vascular: No edema, swelling or tenderness, except as noted in detailed exam. Cardiac: Heart rate and rhythm is regular. Integumentary: No impressive skin lesions present, except as noted in detailed exam. Neuro/Psych: Normal mood and affect, oriented to person, place and time.  Musculoskeletal Examination:  On exam, left thumb pain with left small finger triggering. Positive grind test with adducted thumb. Palpable spurs at base of thumb  Radiographs:  No results were  obtained or interpreted today.  Assessment: ICD-10-CM  1. Acquired trigger finger of left little finger M65.352  2. Arthritis of carpometacarpal [facility]) joint of left thumb M18.12   Plan:  The patient has clinical findings of severe left thumb CMC arthritis and pain, with failed nonoperative measures, including injection, therapy and bracing, and left small trigger finger with persistent triggering.  We discussed the patient's prior x-ray findings. We will plan for left thumb CMC arthroplasty and left small trigger finger release in the near future. I shared the [employer] video on [facility] arthroplasty with an [employer] Mini TightRope.  Surgical Risks:  The nature of the condition and the proposed procedure has been reviewed in detail with the patient. Surgical versus non-surgical options and prognosis for recovery have been reviewed and the inherent risks and benefits of each have been discussed including the risks of infection, bleeding, injury to nerves/blood vessels/tendons, incomplete relief of symptoms, persisting pain and/or stiffness, loss of function, complex regional pain syndrome, failure of the procedure, as appropriate.  Teeth: Upper plate.  Attestation: I, Dawn Royse, am documenting for [employer], MD utilizing [place].    Electronically signed by Lauris Poag, MD at 07/26/2020 7:26 PM EDT  Reviewed  H+P. No changes noted.

## 2020-08-10 NOTE — Discharge Instructions (Addendum)
Keep Arm Elevated Is Much As Possible Pain medicine as directed Ice to the back of the hand today and tomorrow may help with pain Some numbing medicine was placed do not worry about tingling to the some of your fingers to the a.m. tonight Loosen Ace wrap if your fingers start getting puffy, leave splint in place and rewrap Ace Call office if you are having problems  AMBULATORY SURGERY  DISCHARGE INSTRUCTIONS   1) The drugs that you were given will stay in your system until tomorrow so for the next 24 hours you should not:  A) Drive an automobile B) Make any legal decisions C) Drink any alcoholic beverage   2) You may resume regular meals tomorrow.  Today it is better to start with liquids and gradually work up to solid foods.  You may eat anything you prefer, but it is better to start with liquids, then soup and crackers, and gradually work up to solid foods.   3) Please notify your doctor immediately if you have any unusual bleeding, trouble breathing, redness and pain at the surgery site, drainage, fever, or pain not relieved by medication.    4) Additional Instructions:        Please contact your physician with any problems or Same Day Surgery at [phone], Monday through Friday 6 am to 4 pm, or [place] at [facility] number at [phone].

## 2020-08-10 NOTE — Op Note (Signed)
08/10/2020  3:24 PM  PATIENT:  Xavier Hebert  75 y.o. 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  PROCEDURE:  Procedure(s): CARPOMETACARPAL (CMC) FUSION OF THUMB (Left) RELEASE TRIGGER FINGER/A-1 PULLEY (Left)  SURGEON: Laurene Footman, MD  ASSISTANTS: None  ANESTHESIA:   general  EBL:  Total I/O In: 600 [I.V.:600] Out: 10 [Blood:10]  BLOOD ADMINISTERED:none  DRAINS: none   LOCAL MEDICATIONS USED:  MARCAINE     SPECIMEN:  No Specimen  DISPOSITION OF SPECIMEN:  N/A  COUNTS:  YES  TOURNIQUET:   Total Tourniquet Time Documented: Upper Arm (laterality) - 72 minutes Total: Upper Arm (laterality) - 72 minutes   IMPLANTS: Mini tight rope x1 from [employer]  DICTATION: .Dragon Dictation patient was brought to the operating room and after adequate general anesthesia was of obtained the left arm was prepped and draped in usual sterile fashion with a tourniquet by the upper arm.  After patient identification and timeout procedures were completed tourniquet was raised.  A volar incision was made in line of the palmar crease over the A1 pulley of the little finger soft tissues were spread and the A1 pulley identified and incised with a great deal of flexor tenosynovitis synovial fluid present.  After release there was no visible damage to the tendon but extensive flexor tenosynovitis present.  Wound was closed with simple interrupted 4-0 nylon skin sutures.  Going to the thumb incision was made from the base of the first metacarpal to the proximal scaphoid with the skin incision down through the skin with subcutaneous nerves protected tendon split and elevated to allow for subsequent repair.  The capsule was opened and the trapezium exposed and  then split into 4 pieces with a saw and then removed.  After thorough removal of the trapezium the wound was thoroughly irrigated and a guidewire was placed through the base of the first metacarpal into the mid second metacarpal being certain to be within the middle of both bones on the lateral view.  The pin was brought out on the dorsum of the hand and the mini tight rope device passed through this wire through the loop at the end and held in appropriate position with the thumb abducted.  The metal device on the second metacarpal side was loose and it was tightened after is placed down to the bone with no limitation of range of motion of the index finger the [facility] joint was stable to stress views under the mini C arm.  Mini C arm view was saved.  At this point is felt that the [facility] joint would have been adequately addressed and the wounds were closed with Gelfoam placed in the gap left the iron by the removed trapezium the capsule and tendons were repaired in a side-to-side fashion using an 0 Vicryl followed by 203 0 Vicryl subcutaneously 4-0 nylon for the skin with 15 cc of half percent Marcaine injected in the all area of the 3 incisions the wound was then dressed with Xeroform 4 x 4's web roll and a radial gutter thumb spica splint.  Tourniquet let down at the close of the case.  PLAN OF CARE: Discharge to home after PACU  PATIENT DISPOSITION:  PACU - hemodynamically stable.

## 2020-08-10 NOTE — Transfer of Care (Signed)
Immediate Anesthesia Transfer of Care Note  Patient: Xavier Hebert  Procedure(s) Performed: CARPOMETACARPAL ([place]) FUSION OF THUMB (Left Thumb) RELEASE TRIGGER FINGER/A-1 PULLEY (Left )  Patient Location: PACU  Anesthesia Type:General  Level of Consciousness: sedated  Airway & Oxygen Therapy: Patient Spontanous Breathing and Patient connected to face mask oxygen  Post-op Assessment: Report given to RN and Post -op Vital signs reviewed and stable  Post vital signs: Reviewed and stable  Last Vitals:  Vitals Value Taken Time  BP    Temp    Pulse    Resp    SpO2      Last Pain:  Vitals:   08/10/20 1303  TempSrc: Temporal  PainSc: 3          Complications: No complications documented.

## 2020-08-11 ENCOUNTER — Encounter: Payer: Self-pay | Admitting: Orthopedic Surgery

## 2020-08-12 NOTE — Anesthesia Postprocedure Evaluation (Signed)
Anesthesia Post Note  Patient: AUBRY TUCHOLSKI  Procedure(s) Performed: CARPOMETACARPAL ([place]) FUSION OF THUMB (Left Thumb) RELEASE TRIGGER FINGER/A-1 PULLEY (Left )  Patient location during evaluation: PACU Anesthesia Type: General Level of consciousness: awake and alert Pain management: pain level controlled Vital Signs Assessment: post-procedure vital signs reviewed and stable Respiratory status: spontaneous breathing, nonlabored ventilation, respiratory function stable and patient connected to nasal cannula oxygen Cardiovascular status: blood pressure returned to baseline and stable Postop Assessment: no apparent nausea or vomiting Anesthetic complications: no   No complications documented.   Last Vitals:  Vitals:   08/10/20 1632 08/10/20 1645  BP: 122/74 124/76  Pulse: 82 80  Resp: 18 18  Temp: (!) 36.1 C 36.6 C  SpO2: 95% 96%    Last Pain:  Vitals:   08/11/20 0921  TempSrc:   PainSc: 10-Worst pain ever                 Precious Haws Dalon Reichart

## 2020-08-15 ENCOUNTER — Emergency Department: Payer: Medicare Other

## 2020-08-15 ENCOUNTER — Emergency Department
Admission: EM | Admit: 2020-08-15 | Discharge: 2020-08-15 | Disposition: A | Discharge status: Home / Self Care | Payer: Medicare Other | Attending: Emergency Medicine | Admitting: Emergency Medicine

## 2020-08-15 ENCOUNTER — Other Ambulatory Visit: Payer: Self-pay

## 2020-08-15 DIAGNOSIS — K5901 Slow transit constipation: Secondary | ICD-10-CM | POA: Insufficient documentation

## 2020-08-15 DIAGNOSIS — I129 Hypertensive chronic kidney disease with stage 1 through stage 4 chronic kidney disease, or unspecified chronic kidney disease: Secondary | ICD-10-CM | POA: Diagnosis not present

## 2020-08-15 DIAGNOSIS — N183 Chronic kidney disease, stage 3 unspecified: Secondary | ICD-10-CM | POA: Insufficient documentation

## 2020-08-15 DIAGNOSIS — Z91040 Latex allergy status: Secondary | ICD-10-CM | POA: Diagnosis not present

## 2020-08-15 DIAGNOSIS — E1122 Type 2 diabetes mellitus with diabetic chronic kidney disease: Secondary | ICD-10-CM | POA: Insufficient documentation

## 2020-08-15 DIAGNOSIS — I2510 Atherosclerotic heart disease of native coronary artery without angina pectoris: Secondary | ICD-10-CM | POA: Diagnosis not present

## 2020-08-15 DIAGNOSIS — Z955 Presence of coronary angioplasty implant and graft: Secondary | ICD-10-CM | POA: Diagnosis not present

## 2020-08-15 DIAGNOSIS — Z96611 Presence of right artificial shoulder joint: Secondary | ICD-10-CM | POA: Diagnosis not present

## 2020-08-15 DIAGNOSIS — Z794 Long term (current) use of insulin: Secondary | ICD-10-CM | POA: Diagnosis not present

## 2020-08-15 DIAGNOSIS — K5900 Constipation, unspecified: Secondary | ICD-10-CM | POA: Diagnosis present

## 2020-08-15 DIAGNOSIS — Z96653 Presence of artificial knee joint, bilateral: Secondary | ICD-10-CM | POA: Insufficient documentation

## 2020-08-15 DIAGNOSIS — Z79899 Other long term (current) drug therapy: Secondary | ICD-10-CM | POA: Diagnosis not present

## 2020-08-15 DIAGNOSIS — K5903 Drug induced constipation: Secondary | ICD-10-CM

## 2020-08-15 MED ORDER — LACTULOSE 20 G PO PACK: 20.0000 g | PACK | Freq: Two times a day (BID) | ORAL | 0 refills | Status: AC

## 2020-08-15 MED ORDER — LACTULOSE 10 GM/15ML PO SOLN
30.0000 g | Freq: Once | ORAL | Status: AC
  Administered 2020-08-15: 30 g via ORAL
  Filled 2020-08-15: qty 60

## 2020-08-15 MED ORDER — MINERAL OIL RE ENEM: 1.0000 | ENEMA | Freq: Once | RECTAL | Status: DC

## 2020-08-15 NOTE — ED Triage Notes (Signed)
Pt states is constipated from taking oral narcotics for pain post hand surgery. Last bowel movement yesterday. Pt states he feels like he has a broomstick in his rectum.

## 2020-08-15 NOTE — ED Provider Notes (Signed)
[facility] Provider Note  ____________________________________________  Time seen: Approximately 2:23 AM  I have reviewed the triage vital signs and the nursing notes.   HISTORY  Chief Complaint Constipation   HPI [facility] is a 75 y.o. male with a history of chronic pain syndrome currently on 15 mg of oxycodone and 2 mg of Dilaudid several times daily since having surgery in his hand 3 days ago who presents for constipation. Had  BM yesterday.  Patient felt like he needed to go to the bathroom today.  Had a lot of pressure in his rectum but was unable to have a bowel movement.  He took 3 stool softeners and 3 enemas and finally was able to have a bowel movement.  He complained that he still feels like he is constipated but at this time has no rectal pain or pressure, no abdominal pain, no distention, no nausea or vomiting.  Denies any prior history of SBO.  Past Medical History:  Diagnosis Date  . Anxiety   . Arthritis   . Chronic pain syndrome   . CKD (chronic kidney disease) stage 3, GFR 30-59 ml/min (HCC)   . Depression   . Diabetes mellitus without complication ([place])    Type II  . GERD (gastroesophageal reflux disease)   . Headache    Migraine- headache  . Hyperlipidemia   . Hypertension   . Kidney stones   . Peptic ulcer   . Sleep apnea    does not use cpap/ pt states he lost 60#    Patient Active Problem List   Diagnosis Date Noted  . Dysphagia 10/13/2017  . Enthesopathy of ankle and tarsus 08/22/2015  . Capsulitis 08/22/2015  . Diabetes ([place]) 03/10/2015  . 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 Coronary artery disease 09/23/2013  . HTN (hypertension) 09/12/2013  . Hyperlipidemia, unspecified 09/12/2013  . Migraines 09/12/2013  . Sleep apnea 09/12/2013    Past Surgical History:  Procedure Laterality Date  . ANTERIOR CERVICAL DECOMP/DISCECTOMY FUSION  2009  . CARPAL TUNNEL RELEASE Left  01/03/2018   Procedure: CARPAL TUNNEL RELEASE;  Surgeon: Hessie Knows, MD;  Location: [facility] ORS;  Service: Orthopedics;  Laterality: Left;  . CARPOMETACARPAL ([place]) FUSION OF THUMB Left 08/10/2020   Procedure: CARPOMETACARPAL [facility]) FUSION OF THUMB;  Surgeon: Hessie Knows, MD;  Location: [facility] OR[facility];  Service: Orthopedics;  Laterality: Left;  . COLONOSCOPY WITH PROPOFOL N/A 11/21/2017   Procedure: COLONOSCOPY WITH PROPOFOL;  Surgeon: [place], Benay Pike, MD;  Location: [facility] ENDOSCOPY;  Service: Gastroenterology;  Laterality: N/A;  . CYSTOSCOPY     x3- 2 times for stone removal   . ESOPHAGOGASTRODUODENOSCOPY N/A 10/14/2017   Procedure: ESOPHAGOGASTRODUODENOSCOPY (EGD);  Surgeon: Lin Landsman, MD;  Location: [facility];  Service: Gastroenterology;  Laterality: N/A;  . ESOPHAGOGASTRODUODENOSCOPY (EGD) WITH PROPOFOL N/A 10/25/2017   Procedure: ESOPHAGOGASTRODUODENOSCOPY (EGD) WITH PROPOFOL;  Surgeon: Lin Landsman, MD;  Location[facility] END[facility];  Service: Gastroenterology;  Laterality: N/A;  . JOINT REPLACEMENT     bil. knees , shoulder rt, and neck   . REVERSE SHOULDER ARTHROPLASTY Right 12/01/2014  . REVERSE SHOULDER ARTHROPLASTY Right 12/01/2014   Procedure: REVERSE SHOULDER ARTHROPLASTY;  Surgeon: Meredith Pel, MD;  Location: [place] [facility];  Service: Orthopedics;  Laterality: Right;  . SHOULDER ARTHROSCOPY W/ ROTATOR CUFF REPAIR Right 06/10/2014   "failed"  . SHOULDER OPEN ROTATOR CUFF REPAIR Left ~ 2008  . 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  TRIGGER FINGER RELEASE Right 2001  . TRIGGER FINGER RELEASE Left 08/10/2020   Procedure: RELEASE TRIGGER FINGER/A-1 PULLEY;  Surgeon: Hessie Knows, MD;  Location: [facility] OR[facility];  Service: Orthopedics;  Laterality: Left;  . URETERAL STENT PLACEMENT      Prior to Admission medications   Medication Sig Start Date End Date Taking? Authorizing Provider  lactulose (CEPHULAC) 20 g packet Take 1 packet (20 g total) by mouth 2 (two)  times daily for 3 days. 08/15/20 08/18/20 Yes Sandar Krinke, [place], MD  allopurinol (ZYLOPRIM) 100 MG tablet Take 100 mg by mouth daily.  07/11/17   [provider]  butalbital-acetaminophen-caffeine (FIORICET, ESGIC) 50-325-40 MG per tablet Take 1-2 tablets by mouth See admin instructions. Take 2 tablets by mouth at start of headache and take 1 additional tablet by mouth after 2 hours if needed - max 3 tablets daily    [provider]  cholecalciferol (VITAMIN D) 1000 units tablet Take 3,000 Units by mouth daily.    [provider]  clobetasol cream (TEMOVATE) 8.11 % Apply 1 application topically daily as needed (eczema). Apply to affected area(s) 2 to 3 times weekly as needed    [provider]  Coconut Oil 1000 MG CAPS Take 2,000 mg by mouth 2 (two) times daily.    [provider]  colchicine 0.6 MG tablet TAKE 1 TABLET BY MOUTH DAILY Patient taking differently: Take 0.6 mg by mouth daily as needed (gout). 06/18/20   Edrick Kins, DPM  empagliflozin (JARDIANCE) 25 MG TABS tablet Take 12.5 mg by mouth daily.    [provider]  flunisolide (NASALIDE) 25 MCG/ACT (0.025%) SOLN Place 2 sprays into the nose 2 (two) times daily.    [provider]  hydrOXYzine (ATARAX/VISTARIL) 50 MG tablet Take 50 mg by mouth 4 (four) times daily. 04/05/17   [provider]  insulin glargine (LANTUS SOLOSTAR) 100 UNIT/ML Solostar Pen Inject 25 Units into the skin at bedtime.    [provider]  ketoconazole (NIZORAL) 2 % shampoo Apply 1 application topically once a week.  11/10/14   [provider]  L-Tryptophan 500 MG CAPS Take 500 mg by mouth at bedtime.    [provider]  lisinopril (PRINIVIL,ZESTRIL) 40 MG tablet Take 40 mg by mouth daily.    [provider]  magnesium oxide (MAG-OX) 400 MG tablet Take 400 mg by mouth daily.    [provider]  methocarbamol (ROBAXIN) 750 MG tablet Take 750 mg by mouth 4  (four) times daily.    [provider]  Misc Natural Products (OSTEO BI-FLEX ADV TRIPLE ST) TABS Take 1 tablet by mouth in the morning and at bedtime.    [provider]  Multiple Vitamin (MULTIVITAMIN) tablet Take 1 tablet by mouth daily.    [provider]  mupirocin ointment (BACTROBAN) 2 % Apply 1 application topically 2 (two) times daily. 05/20/19   Edrick Kins, DPM  OVER THE COUNTER MEDICATION Take 2 capsules by mouth in the morning and at bedtime. BIO Probiotic    [provider]  oxyCODONE (ROXICODONE) 15 MG immediate release tablet Take 15 mg by mouth every 6 (six) hours.    [provider]  oxyCODONE 7.5 MG TABS Take 7.5 mg by mouth every 4 (four) hours as needed for breakthrough pain. 08/10/20 08/10/21  Hessie Knows, MD  pantoprazole (PROTONIX) 40 MG tablet Take 40 mg by mouth 2 (two) times daily.     [provider]  Pregnenolone Micronized (  PREGNENOLONE PO) Take 25 ng by mouth daily.    [provider]  rosuvastatin (CRESTOR) 20 MG tablet Take 10 mg by mouth at bedtime.     [provider]  Saw Palmetto 450 MG CAPS Take 900 mg by mouth in the morning and at bedtime. Patient not taking: Reported on 08/10/2020    [provider]  Semaglutide,0.25 or 0.5MG /DOS, (OZEMPIC, 0.25 OR 0.5 MG/DOSE,) 2 MG/1.5ML SOPN Inject 1 mg into the skin every Friday.    [provider]  sildenafil (VIAGRA) 100 MG tablet Take 100 mg by mouth daily as needed (ED).    [provider]  silver sulfADIAZINE (SILVADENE) 1 % cream Apply 1 application topically daily. Patient taking differently: Apply 1 application topically daily as needed. 07/15/19   Edrick Kins, DPM  Testosterone 30 MG/ACT SOLN Apply 1 Pump topically in the morning and at bedtime. Apply 1 pump transdermal under each arm daily    [provider]    Allergies Ivp dye [iodinated diagnostic agents], Aspirin, Glipizide, Hydrochlorothiazide,  Ibuprofen, Ioxaglate, Latex, Nsaids, Omeprazole, Pregabalin, Quinine, and Pioglitazone  Family History  Problem Relation Age of Onset  . Dementia Mother   . Alcohol abuse Father     Social History Social History   Tobacco Use  . Smoking status: Never Smoker  . Smokeless tobacco: Never Used  Vaping Use  . Vaping Use: Never used  Substance Use Topics  . Alcohol use: No  . Drug use: No    Review of Systems  Constitutional: Negative for fever. Eyes: Negative for visual changes. ENT: Negative for sore throat. Neck: No neck pain  Cardiovascular: Negative for chest pain. Respiratory: Negative for shortness of breath. Gastrointestinal: Negative for abdominal pain, vomiting or diarrhea. + constipation Genitourinary: Negative for dysuria. Musculoskeletal: Negative for back pain. Skin: Negative for rash. Neurological: Negative for headaches, weakness or numbness. Psych: No SI or HI  ____________________________________________   PHYSICAL EXAM:  VITAL SIGNS: ED Triage Vitals  Enc Vitals Group     BP 08/15/20 0027 140/81     Pulse Rate 08/15/20 0027 97     Resp 08/15/20 0027 16     Temp 08/15/20 0027 98.5 F (36.9 C)     Temp Source 08/15/20 0027 Oral     SpO2 08/15/20 0027 98 %     Weight 08/15/20 0024 187 lb (84.8 kg)     Height 08/15/20 0024 6' (1.829 m)     Head Circumference --      Peak Flow --      Pain Score --      Pain Loc --      Pain Edu? --      Excl. in [place]? --     Constitutional: Alert and oriented. Well appearing and in no apparent distress. HEENT:      Head: Normocephalic and atraumatic.         Eyes: Conjunctivae are normal. Sclera is non-icteric.       Mouth/Throat: Mucous membranes are moist.       Neck: Supple with no signs of meningismus. Cardiovascular: Regular rate and rhythm. No murmurs, gallops, or rubs.  Respiratory: Normal respiratory effort. Lungs are clear to auscultation bilaterally.  Gastrointestinal: Soft, non tender, and non  distended with positive bowel sounds. No rebound or guarding. Genitourinary: No CVA tenderness. Musculoskeletal:  No edema, cyanosis, or erythema of extremities. Neurologic: Normal speech and language. Face is symmetric. Moving all extremities. No gross focal neurologic deficits are appreciated.  Skin: Skin is warm, dry and intact. No rash noted. Psychiatric: Mood and affect are normal. Speech and behavior are normal.  ____________________________________________   LABS (all labs ordered are listed, but only abnormal results are displayed)  Labs Reviewed - No data to display ____________________________________________  EKG  none  ____________________________________________  RADIOLOGY  I have personally reviewed the images performed during this visit and I agree with the Radiologist's read.   Interpretation by Radiologist:  DG Abdomen 1 View  Result Date: 08/15/2020 CLINICAL DATA:  Constipation EXAM: ABDOMEN - 1 VIEW COMPARISON:  None. FINDINGS: Lung bases are clear. Nonobstructed gas pattern with moderate stool in the colon. No radiopaque calculi. Degenerative changes of the spine. IMPRESSION: Nonobstructed gas pattern with moderate stool Electronically Signed   By: Donavan Foil M.D.   On: 08/15/2020 01:22     ____________________________________________   PROCEDURES  Procedure(s) performed: None Procedures Critical Care performed:  None ____________________________________________   INITIAL IMPRESSION / ASSESSMENT AND PLAN / ED COURSE   75 y.o. male with a history of chronic pain syndrome currently on 15 mg of oxycodone and 2 mg of Dilaudid several times daily since having surgery in his hand 3 days ago who presents for constipation.  After taking 3 stool softeners and enemas at home patient was able to have a bowel movement.  Arrives to the emergency room no longer having any pain but states that he still feels constipated.  Abdominal exam is benign with no tenderness  or distention.  X-ray showing moderate constipation with no signs of large stool retention rectally.  we will start patient on lactulose for couple of days, discussed increasing water intake and follow-up with his primary care doctor.  Recommended return to the emergency room for abdominal distention, abdominal pain or vomiting.       _____________________________________________ Please note:  Patient was evaluated in Emergency Department today for the symptoms described in the history of present illness. Patient was evaluated in the context of the global COVID-19 pandemic, which necessitated consideration that the patient might be at risk for infection with the SARS-CoV-2 virus that causes COVID-19. Institutional protocols and algorithms that pertain to the evaluation of patients at risk for COVID-19 are in a state of rapid change based on information released by regulatory bodies including the CDC and federal and state organizations. These policies and algorithms were followed during the patient's care in the ED.  Some ED evaluations and interventions may be delayed as a result of limited staffing during the pandemic.   [place] Controlled Substance Database was reviewed by me. ____________________________________________   FINAL CLINICAL IMPRESSION(S) / ED DIAGNOSES   Final diagnoses:  Drug-induced constipation      NEW MEDICATIONS STARTED DURING THIS VISIT:  ED Discharge Orders         Ordered    lactulose (CEPHULAC) 20 g packet  2 times daily        08/15/20 0229           Note:  This document was prepared using Dragon voice recognition software and may include unintentional dictation errors.    Rudene Re, MD 08/15/20 [phone]

## 2020-08-17 ENCOUNTER — Ambulatory Visit (INDEPENDENT_AMBULATORY_CARE_PROVIDER_SITE_OTHER): Payer: Medicare Other | Admitting: Podiatry

## 2020-08-17 ENCOUNTER — Other Ambulatory Visit: Payer: Self-pay

## 2020-08-17 DIAGNOSIS — M79675 Pain in left toe(s): Secondary | ICD-10-CM | POA: Diagnosis not present

## 2020-08-17 DIAGNOSIS — M79674 Pain in right toe(s): Secondary | ICD-10-CM | POA: Diagnosis not present

## 2020-08-17 DIAGNOSIS — B351 Tinea unguium: Secondary | ICD-10-CM

## 2020-08-17 NOTE — Progress Notes (Signed)
   SUBJECTIVE Patient presents to office today complaining of elongated, thickened nails that cause pain while ambulating in shoes.  He is unable to trim his own nails. Patient is here for further evaluation and treatment.  Past Medical History:  Diagnosis Date  . Anxiety   . Arthritis   . Chronic pain syndrome   . CKD (chronic kidney disease) stage 3, GFR 30-59 ml/min (HCC)   . Depression   . Diabetes mellitus without complication ([place])    Type II  . GERD (gastroesophageal reflux disease)   . Headache    Migraine- headache  . Hyperlipidemia   . Hypertension   . Kidney stones   . Peptic ulcer   . Sleep apnea    does not use cpap/ pt states he lost 60#    OBJECTIVE General Patient is awake, alert, and oriented x 3 and in no acute distress. Derm Skin is dry and supple bilateral. Negative open lesions or macerations. Remaining integument unremarkable. Nails are tender, long, thickened and dystrophic with subungual debris, consistent with onychomycosis, 1-5 bilateral. No signs of infection noted. Vasc  DP and PT pedal pulses palpable bilaterally. Temperature gradient within normal limits.  Neuro Epicritic and protective threshold sensation grossly intact bilaterally.  Musculoskeletal Exam No symptomatic pedal deformities noted bilateral. Muscular strength within normal limits.  ASSESSMENT 1. Onychodystrophic nails 1-5 bilateral with hyperkeratosis of nails.  2. 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 Pain in foot bilateral  PLAN OF CARE 1. Patient evaluated today.  2. Instructed to maintain good pedal hygiene and foot care.  3. Mechanical debridement of nails 1-5 bilaterally performed using a nail nipper. Filed with dremel without incident.  4. Return to clinic in 3 mos.   Ellene Route, present at today's visit, name is Lauralee Evener, DPM [facility]  Dr. Edrick Kins, DPM    [address] [place],  [place] [address]                Office [phone]  Fax [phone]

## 2020-08-19 ENCOUNTER — Encounter: Payer: Self-pay | Admitting: Occupational Therapy

## 2020-08-19 ENCOUNTER — Ambulatory Visit: Payer: Medicare Other | Attending: Orthopedic Surgery | Admitting: Occupational Therapy

## 2020-08-19 ENCOUNTER — Other Ambulatory Visit: Payer: Self-pay

## 2020-08-19 DIAGNOSIS — M79642 Pain in left hand: Secondary | ICD-10-CM | POA: Diagnosis not present

## 2020-08-19 DIAGNOSIS — M65352 Trigger finger, left little finger: Secondary | ICD-10-CM | POA: Insufficient documentation

## 2020-08-19 DIAGNOSIS — M6281 Muscle weakness (generalized): Secondary | ICD-10-CM | POA: Diagnosis present

## 2020-08-19 DIAGNOSIS — M25642 Stiffness of left hand, not elsewhere classified: Secondary | ICD-10-CM | POA: Diagnosis present

## 2020-08-19 DIAGNOSIS — R600 Localized edema: Secondary | ICD-10-CM | POA: Diagnosis present

## 2020-08-19 NOTE — Therapy (Signed)
[place] [facility] [address], [place], [address] Phone: [phone]   Fax:  [phone]  Occupational Therapy Evaluation  Patient Details  Name: Xavier Hebert MRN: [id number] Date of Birth: Nov 13, 1945 Referring Provider (OT): Dr Rudene Christians   Encounter Date: 08/19/2020   OT End of Session - 08/19/20 1722    Visit Number 1    Number of Visits 16    Date for OT Re-Evaluation 10/14/20    OT Start Time 2836    OT Stop Time 1530    OT Time Calculation (min) 45 min    Activity Tolerance Patient tolerated treatment well    Behavior During Therapy [facility] for tasks assessed/performed           Past Medical History:  Diagnosis Date  . Anxiety   . Arthritis   . Chronic pain syndrome   . CKD (chronic kidney disease) stage 3, GFR 30-59 ml/min (HCC)   . Depression   . Diabetes mellitus without complication ([place])    Type II  . GERD (gastroesophageal reflux disease)   . Headache    Migraine- headache  . Hyperlipidemia   . Hypertension   . Kidney stones   . Peptic ulcer   . Sleep apnea    does not use cpap/ pt states he lost 60#    Past Surgical History:  Procedure Laterality Date  . ANTERIOR CERVICAL DECOMP/DISCECTOMY FUSION  2009  . CARPAL TUNNEL RELEASE Left 01/03/2018   Procedure: CARPAL TUNNEL RELEASE;  Surgeon: Hessie Knows, MD;  Location: [facility] ORS;  Service: Orthopedics;  Laterality: Left;  . CARPOMETACARPAL ([place]) FUSION OF THUMB Left 08/10/2020   Procedure: CARPOMETACARPAL [facility]) FUSION OF THUMB;  Surgeon: Hessie Knows, MD;  Location: [facility];  Service: Orthopedics;  Laterality: Left;  . COLONOSCOPY WITH PROPOFOL N/A 11/21/2017   Procedure: COLONOSCOPY WITH PROPOFOL;  Surgeon: Toledo, Benay Pike, MD;  Location: [facility] ENDOSCOPY;  Service: Gastroenterology;  Laterality: N/A;  . CYSTOSCOPY     x3- 2 times for stone removal   . ESOPHAGOGASTRODUODENOSCOPY N/A 10/14/2017   Procedure: ESOPHAGOGASTRODUODENOSCOPY (EGD);   Surgeon: Lin Landsman, MD;  Location: [facility] ENDOSCOPY;  Service: Gastroenterology;  Laterality: N/A;  . ESOPHAGOGASTRODUODENOSCOPY (EGD) WITH PROPOFOL N/A 10/25/2017   Procedure: ESOPHAGOGASTRODUODENOSCOPY (EGD) WITH PROPOFOL;  Surgeon: Lin Landsman, MD;  Location: [facility] END[facility]COPY;  Service: Gastroenterology;  Laterality: N/A;  . JOINT REPLACEMENT     bil. knees , shoulder rt, and neck   . REVERSE SHOULDER ARTHROPLASTY Right 12/01/2014  . REVERSE SHOULDER ARTHROPLASTY Right 12/01/2014   Procedure: REVERSE SHOULDER ARTHROPLASTY;  Surgeon: Meredith Pel, MD;  Location: [place];  Service: Orthopedics;  Laterality: Right;  . SHOULDER ARTHROSCOPY W/ ROTATOR CUFF REPAIR Right 06/10/2014   "failed"  . SHOULDER OPEN ROTATOR CUFF REPAIR Left ~ 2008  . 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 TRIGGER FINGER RELEASE Right 2001  . TRIGGER FINGER RELEASE Left 08/10/2020   Procedure: RELEASE TRIGGER FINGER/A-1 PULLEY;  Surgeon: Hessie Knows, MD;  Location: [facility] ORS;  Service: Orthopedics;  Laterality: Left;  . URETERAL STENT PLACEMENT      There were no vitals filed for this visit.   Subjective Assessment - 08/19/20 1716    Subjective  My pain and swelling in my L hand , thumb and pinkie had been so bad since the surgery - and I was trying to get some more pain medication    Pertinent History Xavier Hebert  is a 75 y.o. male who presents today status post left thumb CMC arthroplasty and left little finger trigger release by Dr. Hessie Knows on 08/10/2020. He is on chronic pain medication of oxycodone 15 mg every 6 hours and has been given 7.5 mg for breakthrough pain every 6 hours. Patient's severe pain and swelling did improve - but refer to OT/hand therapy - . Still having some pain and discomfort throughout the hand and little finger. No catching triggering locking of the fifth digit.    Patient Stated Goals Want the pain and use of my L hand  better so I can play my  instruments    Currently in Pain? Yes    Pain Score 10-Worst pain ever   pinkie   Pain Location Hand    Pain Orientation Left    Pain Descriptors / Indicators Throbbing;Tightness    Pain Type Surgical pain    Pain Frequency Constant             [facility] OT Assessment - 08/19/20 0001      Assessment   Medical Diagnosis L Thumb CMC arthroplasty and 5th trigger finger release    Referring Provider (OT) Dr Rudene Christians    Onset Date/Surgical Date 08/10/20    Hand Dominance Right    Prior Therapy April for [facility] OA and trigger finger on 5th      Precautions   Precaution Comments CMC arthroplasty protocol -    Required Braces or Orthoses --   Prefab thumb spica     Home  Environment   Lives With Alone      Prior Function   Vocation Retired    Leisure likes to play string instruement - some cooking , on Iphone      Edema   Edema 1cm increase in 5th PIP ,and MC's      Left Hand AROM   L Index  MCP 0-90 60 Degrees    L Index PIP 0-100 95 Degrees    L Long  MCP 0-90 60 Degrees    L Long PIP 0-100 95 Degrees    L Ring  MCP 0-90 65 Degrees    L Ring PIP 0-100 95 Degrees    L Little  MCP 0-90 70 Degrees   pain   L Little PIP 0-100 70 Degrees   pain                   OT Treatments/Exercises (OP) - 08/19/20 0001      LUE Contrast Bath   Time 12 minutes    Comments decrease edema and pain - prior to tendon glides AROM          done prior  To review with pt HEP for tendon glides - digits only - stop when feeling slight pull  10 reps THumb spica splint - ed on fastening straps correct - had it to tight- causing increase edema over MC's and digits         OT Education - 08/19/20 1722    Education Details findings of eval and HEP    Person(s) Educated Patient    Methods Explanation;Demonstration;Tactile cues;Verbal cues;Handout    Comprehension Verbal cues required;Returned demonstration;Verbalized understanding            OT Short Term Goals - 08/19/20 1731       OT SHORT TERM GOAL #1   Title Pt to be independent in HEP to decrease edema, pain for pt to wean out of splint and  do HEP for AROM without increase symptoms    Baseline pain 5-10/10 in thumb and 5th digiit - increase edema in 5th and MC -about 1 cm - and decrease AROM MC's 60-70 L hand    Time 4    Period Weeks    Status New    Target Date 09/16/20             OT Long Term Goals - 08/19/20 1732      OT LONG TERM GOAL #1   Title AROM in digits increase for pt to touch palm without increase symptoms    Baseline MC's 60-70 - edema increase 1 cm over 5th PIP and MC's - pain 5-10/10    Time 4    Period Weeks    Status New    Target Date 09/16/20      OT LONG TERM GOAL #2   Title L thumb AROM PA and RA improve to [facility] and opposition to base of 5th to use in pick up light glass and retrieve 2 cm objects out of palm    Baseline NT - 10 days out of surgery in thumb spica    Time 6    Target Date 09/30/20      OT LONG TERM GOAL #3   Title L wrist AROM and strength increase to WNL to push door open and turn doorkonbs    Baseline NT- in thumb spica - 10 days s/p    Time 6    Period Weeks    Status New    Target Date 09/30/20      OT LONG TERM GOAL #4   Title L grip and prehnesion strength increase to more than 70% compare to R hand to initiate playing instruments without increase symptoms    Baseline 10 days s/p- in thumb spica    Time 10    Period Weeks    Target Date 10/28/20                 Plan - 08/19/20 1724    Clinical Impression Statement Pt present at OT eval 10 days s/p L thumb CMC arthroplasty and 5th digit trigger finger release - pt with increase edema in hand and pain - pain 5-10/10 over 5th and thumb - pt in prefab thums spica- notice pt splint straps to tight and causing increase edema over MCand digits - pt ed on splint wearing - and contrast to use at home to decrease edema. Pt show decrease AROM in digits - pt ed on tendon glides to digits - but no  motion to thumb and wrrist and keep thumb spica on as order to surgeon- pt can benefit from OT services to decrease edema, pain and increase AROM and strength for use in ADL's and IADL's    OT Occupational Profile and History Problem Focused Assessment - Including review of records relating to presenting problem    Occupational performance deficits (Please refer to evaluation for details): ADL's;IADL's;Play    Body Structure / Function / Physical Skills ADL;Strength;Pain;IADL;FMC;UE functional use    Clinical Decision Making Limited treatment options, no task modification necessary    Comorbidities Affecting Occupational Performance: May have comorbidities impacting occupational performance    Modification or Assistance to Complete Evaluation  No modification of tasks or assist necessary to complete eval    OT Frequency 2x / week   decreaes to 1x as appropriate   OT Duration 8 weeks    OT Treatment/Interventions Self-care/ADL training;Paraffin;Manual Therapy;Patient/family education;Therapeutic  exercise;Splinting;DME and/or AE instruction;Fluidtherapy;Contrast Bath;Scar mobilization    Consulted and Agree with Plan of Care Patient           Patient will benefit from skilled therapeutic intervention in order to improve the following deficits and impairments:   Body Structure / Function / Physical Skills: ADL,Strength,Pain,IADL,FMC,UE functional use       Visit Diagnosis: Pain in left hand - Plan: Ot plan of care cert/re-cert  Localized edema - Plan: Ot plan of care cert/re-cert  Stiffness of left hand, not elsewhere classified - Plan: Ot plan of care cert/re-cert  Muscle weakness (generalized) - Plan: Ot plan of care cert/re-cert    Problem List Patient Active Problem List   Diagnosis Date Noted  . Dysphagia 10/13/2017  . Enthesopathy of ankle and tarsus 08/22/2015  . Capsulitis 08/22/2015  . Diabetes ([place]) 03/10/2015  . 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  Coronary artery disease 09/23/2013  . HTN (hypertension) 09/12/2013  . Hyperlipidemia, unspecified 09/12/2013  . Migraines 09/12/2013  . Sleep apnea 09/12/2013    Rosalyn Gess OTR/L,CLT 08/19/2020, 5:39 PM  [place] [address] [place], [address] Phone: [phone]   Fax:  [phone]  Name: KIMSEY DEMAREE MRN: [id number] Date of Birth: Apr 04, 1945

## 2020-08-24 ENCOUNTER — Ambulatory Visit: Payer: Medicare Other | Admitting: Occupational Therapy

## 2020-08-25 ENCOUNTER — Ambulatory Visit: Payer: Medicare Other | Admitting: Occupational Therapy

## 2020-09-01 ENCOUNTER — Ambulatory Visit: Payer: Medicare Other | Admitting: Occupational Therapy

## 2020-09-01 ENCOUNTER — Other Ambulatory Visit: Payer: Self-pay

## 2020-09-01 DIAGNOSIS — M65352 Trigger finger, left little finger: Secondary | ICD-10-CM

## 2020-09-01 DIAGNOSIS — M79642 Pain in left hand: Secondary | ICD-10-CM

## 2020-09-01 DIAGNOSIS — M25642 Stiffness of left hand, not elsewhere classified: Secondary | ICD-10-CM

## 2020-09-01 DIAGNOSIS — M6281 Muscle weakness (generalized): Secondary | ICD-10-CM

## 2020-09-01 DIAGNOSIS — R600 Localized edema: Secondary | ICD-10-CM

## 2020-09-01 NOTE — Therapy (Signed)
[place] [facility] [address] [place], [place], [address] Phone: [phone]   Fax:  [phone]  Occupational Therapy Treatment  Patient Details  Name: Xavier Hebert MRN: [id number] Date of Birth: 1945-04-19 Referring Provider (OT): Dr Rudene Christians   Encounter Date: 09/01/2020   OT End of Session - 09/01/20 1717     Visit Number 2    Number of Visits 16    Date for OT Re-Evaluation 10/14/20    OT Start Time 0092    OT Stop Time 1715    OT Time Calculation (min) 58 min    Activity Tolerance Patient tolerated treatment well    Behavior During Therapy [facility] for tasks assessed/performed             Past Medical History:  Diagnosis Date   Anxiety    Arthritis    Chronic pain syndrome    CKD (chronic kidney disease) stage 3, GFR 30-59 ml/min ([place])    Depression    Diabetes mellitus without complication ([place])    Type II   GERD (gastroesophageal reflux disease)    Headache    Migraine- headache   Hyperlipidemia    Hypertension    Kidney stones    Peptic ulcer    Sleep apnea    does not use cpap/ pt states he lost 60#    Past Surgical History:  Procedure Laterality Date   ANTERIOR CERVICAL DECOMP/DISCECTOMY FUSION  2009   CARPAL TUNNEL RELEASE Left 01/03/2018   Procedure: CARPAL TUNNEL RELEASE;  Surgeon: Hessie Knows, MD;  Location: [facility];  Service: Orthopedics;  Laterality: Left;   CARPOMETACARPAL ([place]) FUSION OF THUMB Left 08/10/2020   Procedure: CARPOMETACARPAL [facility]) FUSION OF THUMB;  Surgeon: Hessie Knows, MD;  Location: [facility];  Service: Orthopedics;  Laterality: Left;   COLONOSCOPY WITH PROPOFOL N/A 11/21/2017   Procedure: COLONOSCOPY WITH PROPOFOL;  Surgeon: Toledo, Benay Pike, MD;  Location: [facility] ENDOSCOPY;  Service: Gastroenterology;  Laterality: N/A;   CYSTOSCOPY     x3- 2 times for stone removal    ESOPHAGOGASTRODUODENOSCOPY N/A 10/14/2017   Procedure: ESOPHAGOGASTRODUODENOSCOPY (EGD);  Surgeon:  Lin Landsman, MD;  Location: [facility] ENDOSCOPY;  Service: Gastroenterology;  Laterality: N/A;   ESOPHAGOGASTRODUODENOSCOPY (EGD) WITH PROPOFOL N/A 10/25/2017   Procedure: ESOPHAGOGASTRODUODENOSCOPY (EGD) WITH PROPOFOL;  Surgeon: Lin Landsman, MD;  Location: [facility] ENDOSCOPY;  Service: Gastroenterology;  Laterality: N/A;   JOINT REPLACEMENT     bil. knees , shoulder rt, and neck    REVERSE SHOULDER ARTHROPLASTY Right 12/01/2014   REVERSE SHOULDER ARTHROPLASTY Right 12/01/2014   Procedure: REVERSE SHOULDER ARTHROPLASTY;  Surgeon: Meredith Pel, MD;  Location: [place];  Service: Orthopedics;  Laterality: Right;   SHOULDER ARTHROSCOPY W/ ROTATOR CUFF REPAIR Right 06/10/2014   "failed"   SHOULDER OPEN ROTATOR CUFF REPAIR Left ~ 2008   TOTAL KNEE ARTHROPLASTY Bilateral 2005-2008   "right-left"   TRIGGER FINGER RELEASE Right 2001   TRIGGER FINGER RELEASE Left 08/10/2020   Procedure: RELEASE TRIGGER FINGER/A-1 PULLEY;  Surgeon: Hessie Knows, MD;  Location: [facility];  Service: Orthopedics;  Laterality: Left;   URETERAL STENT PLACEMENT      There were no vitals filed for this visit.   Subjective Assessment - 09/01/20 1714     Subjective  Sorry - I forgot my appt last week and Dr Rudene Christians said I could take off my splint and start playing my guitar- had been playing but thumb hurts more - and my pain  medication is messed up at the pain clinic    Pertinent History Xavier Hebert is a 75 y.o. male who presents today status post left thumb CMC arthroplasty and left little finger trigger release by Dr. Hessie Knows on 08/10/2020. He is on chronic pain medication of oxycodone 15 mg every 6 hours and has been given 7.5 mg for breakthrough pain every 6 hours. Patient's severe pain and swelling did improve - but refer to OT/hand therapy - . Still having some pain and discomfort throughout the hand and little finger. No catching triggering locking of the fifth digit.    Patient Stated Goals Want the pain  and use of my L hand  better so I can play my instruments    Currently in Pain? Yes    Pain Location --   Thumb   Pain Orientation Left    Pain Descriptors / Indicators Aching;Tightness    Pain Type Surgical pain    Pain Onset More than a month ago                [facility] OT Assessment - 09/01/20 0001       Left Hand AROM   L Thumb Radial ADduction/ABduction 0-55 45   52 R   L Thumb Palmar ADduction/ABduction 0-45 60   68 R   L Index  MCP 0-90 75 Degrees    L Index PIP 0-100 100 Degrees    L Long  MCP 0-90 80 Degrees    L Long PIP 0-100 100 Degrees    L Ring  MCP 0-90 90 Degrees    L Ring PIP 0-100 100 Degrees    L Little  MCP 0-90 90 Degrees    L Little PIP 0-100 100 Degrees              Pt AROM for digits WNL and scar at trigger 5th digit no pain or edema Thumb AROM review and add to HEP  Contrast -AROM blocked IP and MC flexion of thumb  Circular AROM for thumb  PA and RA of thumb  10 reps  2 x day  Opposition to 2nd and 3rd - pt collapsing in to palm with CMC - if doing 4th and 5th  Pt to wear thumb spica some and wean gradually and play instrument short periods and the one with larger handle for cords and if needed wear CMC neoprene  Pain keeping under 2/10  Scar massage can start over the weekend                  OT Education - 09/01/20 1716     Education Details progress and changes to HEP    Person(s) Educated Patient    Methods Explanation;Demonstration;Tactile cues;Verbal cues;Handout    Comprehension Verbal cues required;Returned demonstration;Verbalized understanding              OT Short Term Goals - 08/19/20 1731       OT SHORT TERM GOAL #1   Title Pt to be independent in HEP to decrease edema, pain for pt to wean out of splint and do HEP for AROM without increase symptoms    Baseline pain 5-10/10 in thumb and 5th digiit - increase edema in 5th and MC -about 1 cm - and decrease AROM MC's 60-70 L hand    Time 4    Period  Weeks    Status New    Target Date 09/16/20  OT Long Term Goals - 08/19/20 1732       OT LONG TERM GOAL #1   Title AROM in digits increase for pt to touch palm without increase symptoms    Baseline MC's 60-70 - edema increase 1 cm over 5th PIP and MC's - pain 5-10/10    Time 4    Period Weeks    Status New    Target Date 09/16/20      OT LONG TERM GOAL #2   Title L thumb AROM PA and RA improve to [facility] and opposition to base of 5th to use in pick up light glass and retrieve 2 cm objects out of palm    Baseline NT - 10 days out of surgery in thumb spica    Time 6    Target Date 09/30/20      OT LONG TERM GOAL #3   Title L wrist AROM and strength increase to WNL to push door open and turn doorkonbs    Baseline NT- in thumb spica - 10 days s/p    Time 6    Period Weeks    Status New    Target Date 09/30/20      OT LONG TERM GOAL #4   Title L grip and prehnesion strength increase to more than 70% compare to R hand to initiate playing instruments without increase symptoms    Baseline 10 days s/p- in thumb spica    Time 10    Period Weeks    Target Date 10/28/20                   Plan - 09/01/20 1717     Clinical Impression Statement Pt present at OT 3 1/2 wks  s/p L thumb CMC arthroplasty and 5th digit trigger finger release -was not seen since eval and report increase pain - Pt stopped wearing his thum spica during day and started playing more of his string instruments - ed pt on weaning gradually out of his splint and play instrument short period and CMC neoprene splint - started AROM for thumb in all planes for all joints - pain free-pt to work on IP and MC flexion -and oppostion to 2nd and 3rd only - CMC collapsing into palm some -  AROM in digits WNL -  pt can benefit from OT services to decrease edema, pain and increase AROM and strength for use in ADL's and IADL's    OT Occupational Profile and History Problem Focused Assessment - Including review  of records relating to presenting problem    Occupational performance deficits (Please refer to evaluation for details): ADL's;IADL's;Play    Body Structure / Function / Physical Skills ADL;Strength;Pain;IADL;FMC;UE functional use    Rehab Potential Fair    Clinical Decision Making Limited treatment options, no task modification necessary    Comorbidities Affecting Occupational Performance: May have comorbidities impacting occupational performance    Modification or Assistance to Complete Evaluation  No modification of tasks or assist necessary to complete eval    OT Frequency 2x / week    OT Duration 8 weeks    OT Treatment/Interventions Self-care/ADL training;Paraffin;Manual Therapy;Patient/family education;Therapeutic exercise;Splinting;DME and/or AE instruction;Fluidtherapy;Contrast Bath;Scar mobilization    Consulted and Agree with Plan of Care Patient             Patient will benefit from skilled therapeutic intervention in order to improve the following deficits and impairments:   Body Structure / Function / Physical Skills: ADL, Strength, Pain, IADL, FMC,  UE functional use       Visit Diagnosis: Pain in left hand  Localized edema  Stiffness of left hand, not elsewhere classified  Muscle weakness (generalized)  Trigger finger, left little finger    Problem List Patient Active Problem List   Diagnosis Date Noted   Dysphagia 10/13/2017   Enthesopathy of ankle and tarsus 08/22/2015   Capsulitis 08/22/2015   Diabetes ([place]) 03/10/2015   Arthritis of shoulder region, degenerative 12/01/2014   Coronary artery disease 09/23/2013   HTN (hypertension) 09/12/2013   Hyperlipidemia, unspecified 09/12/2013   Migraines 09/12/2013   Sleep apnea 09/12/2013    Rosalyn Gess OTR/L,CLT 09/01/2020, 6:56 PM  [facility] [address] [place], [address] Phone: [phone]   Fax:  [phone]  Name:  IOSEFA WEINTRAUB MRN: [id number] Date of Birth: 1945-11-05

## 2020-09-09 ENCOUNTER — Ambulatory Visit: Payer: Medicare Other | Admitting: Occupational Therapy

## 2020-09-09 DIAGNOSIS — R600 Localized edema: Secondary | ICD-10-CM

## 2020-09-09 DIAGNOSIS — M65352 Trigger finger, left little finger: Secondary | ICD-10-CM

## 2020-09-09 DIAGNOSIS — M79642 Pain in left hand: Secondary | ICD-10-CM

## 2020-09-09 DIAGNOSIS — M6281 Muscle weakness (generalized): Secondary | ICD-10-CM

## 2020-09-09 NOTE — Therapy (Signed)
[facility] [address], [place], [address] Phone: [phone]   Fax:  [phone]  Occupational Therapy Treatment  Patient Details  Name: Xavier Hebert MRN: [id number] Date of Birth: 07/11/1945 Referring Provider (OT): Dr Rudene Christians   Encounter Date: 09/09/2020   OT End of Session - 09/09/20 1231     Visit Number 3    Number of Visits 16    Date for OT Re-Evaluation 10/14/20    OT Start Time 0915    OT Stop Time 0945    OT Time Calculation (min) 30 min    Activity Tolerance Patient tolerated treatment well    Behavior During Therapy [facility] for tasks assessed/performed             Past Medical History:  Diagnosis Date   Anxiety    Arthritis    Chronic pain syndrome    CKD (chronic kidney disease) stage 3, GFR 30-59 ml/min ([place])    Depression    Diabetes mellitus without complication ([place])    Type II   GERD (gastroesophageal reflux disease)    Headache    Migraine- headache   Hyperlipidemia    Hypertension    Kidney stones    Peptic ulcer    Sleep apnea    does not use cpap/ pt states he lost 60#    Past Surgical History:  Procedure Laterality Date   ANTERIOR CERVICAL DECOMP/DISCECTOMY FUSION  2009   CARPAL TUNNEL RELEASE Left 01/03/2018   Procedure: CARPAL TUNNEL RELEASE;  Surgeon: Hessie Knows, MD;  Location: [facility] ORS;  Service: Orthopedics;  Laterality: Left;   CARPOMETACARPAL ([place]) FUSION OF THUMB Left 08/10/2020   Procedure: CARPOMETACARPAL [facility]) FUSION OF THUMB;  Surgeon: Hessie Knows, MD;  Location: [facility];  Service: Orthopedics;  Laterality: Left;   COLONOSCOPY WITH PROPOFOL N/A 11/21/2017   Procedure: COLONOSCOPY WITH PROPOFOL;  Surgeon: Toledo, Benay Pike, MD;  Location: [facility] ENDOSCOPY;  Service: Gastroenterology;  Laterality: N/A;   CYSTOSCOPY     x3- 2 times for stone removal    ESOPHAGOGASTRODUODENOSCOPY N/A 10/14/2017   Procedure: ESOPHAGOGASTRODUODENOSCOPY (EGD);  Surgeon:  Lin Landsman, MD;  Location: [facility] ENDOSCOPY;  Service: Gastroenterology;  Laterality: N/A;   ESOPHAGOGASTRODUODENOSCOPY (EGD) WITH PROPOFOL N/A 10/25/2017   Procedure: ESOPHAGOGASTRODUODENOSCOPY (EGD) WITH PROPOFOL;  Surgeon: Lin Landsman, MD;  Location: [facility] ENDOSCOPY;  Service: Gastroenterology;  Laterality: N/A;   JOINT REPLACEMENT     bil. knees , shoulder rt, and neck    REVERSE SHOULDER ARTHROPLASTY Right 12/01/2014   REVERSE SHOULDER ARTHROPLASTY Right 12/01/2014   Procedure: REVERSE SHOULDER ARTHROPLASTY;  Surgeon: Meredith Pel, MD;  Location: [facility];  Service: Orthopedics;  Laterality: Right;   SHOULDER ARTHROSCOPY W/ ROTATOR CUFF REPAIR Right 06/10/2014   "failed"   SHOULDER OPEN ROTATOR CUFF REPAIR Left ~ 2008   TOTAL KNEE ARTHROPLASTY Bilateral 2005-2008   "right-left"   TRIGGER FINGER RELEASE Right 2001   TRIGGER FINGER RELEASE Left 08/10/2020   Procedure: RELEASE TRIGGER FINGER/A-1 PULLEY;  Surgeon: Hessie Knows, MD;  Location: [facility] ORS;  Service: Orthopedics;  Laterality: Left;   URETERAL STENT PLACEMENT      There were no vitals filed for this visit.   Subjective Assessment - 09/09/20 1034     Subjective  I have been doing my HEP and using the hard splint about 80% of time- playing guitar some - but cannot find my soft splint    Pertinent History Xavier Hebert is  a 75 y.o. male who presents today status post left thumb CMC arthroplasty and left little finger trigger release by Dr. Hessie Knows on 08/10/2020. He is on chronic pain medication of oxycodone 15 mg every 6 hours and has been given 7.5 mg for breakthrough pain every 6 hours. Patient's severe pain and swelling did improve - but refer to OT/hand therapy - . Still having some pain and discomfort throughout the hand and little finger. No catching triggering locking of the fifth digit.    Patient Stated Goals Want the pain and use of my L hand  better so I can play my instruments    Currently in Pain?  No/denies                [facility] OT Assessment - 09/09/20 0001       Left Hand AROM   L Thumb MCP 0-60 30 Degrees   R 45   L Thumb Opposition to Index --   Opposition to 3rd - cue for 4th and 5th keep CMC out                Pt AROM for digits WNL and scar at trigger 5th digit no pain or edema Pt show decrease MC flexion of L thumb compare to R  And cont to collapse at [facility] if opposition to 4th and 5th - but better than last time  Add PROM to [facility] of thumb on L  Play guitar this date - did not had pain at L thumb - but using add of CMC and some hyper extention at [facility] and IP flexion   Pt to use CMC neoprene when play string instrument and not to long - built up gradually time - and pain free    Thumb AROM review  Contrast -A/AROM blocked IP and MC flexion of thumb Circular AROM for thumb PA and RA of thumb 10 reps 2 x day Opposition to all digits this date but t/c for [facility] to stay out of palm to 4th and 5th   Pt to wean out of thumb spica into CMC neoprene and when sitting doing nothing can keep splint off   Pain keeping under 2/10                  OT Education - 09/09/20 1231     Education Details progress and changes to HEP    Person(s) Educated Patient    Methods Explanation;Demonstration;Tactile cues;Verbal cues;Handout    Comprehension Verbal cues required;Returned demonstration;Verbalized understanding              OT Short Term Goals - 08/19/20 1731       OT SHORT TERM GOAL #1   Title Pt to be independent in HEP to decrease edema, pain for pt to wean out of splint and do HEP for AROM without increase symptoms    Baseline pain 5-10/10 in thumb and 5th digiit - increase edema in 5th and MC -about 1 cm - and decrease AROM MC's 60-70 L hand    Time 4    Period Weeks    Status New    Target Date 09/16/20               OT Long Term Goals - 08/19/20 1732       OT LONG TERM GOAL #1   Title AROM in digits increase for pt to touch palm  without increase symptoms    Baseline MC's 60-70 - edema increase 1 cm  over 5th PIP and MC's - pain 5-10/10    Time 4    Period Weeks    Status New    Target Date 09/16/20      OT LONG TERM GOAL #2   Title L thumb AROM PA and RA improve to [facility] and opposition to base of 5th to use in pick up light glass and retrieve 2 cm objects out of palm    Baseline NT - 10 days out of surgery in thumb spica    Time 6    Target Date 09/30/20      OT LONG TERM GOAL #3   Title L wrist AROM and strength increase to WNL to push door open and turn doorkonbs    Baseline NT- in thumb spica - 10 days s/p    Time 6    Period Weeks    Status New    Target Date 09/30/20      OT LONG TERM GOAL #4   Title L grip and prehnesion strength increase to more than 70% compare to R hand to initiate playing instruments without increase symptoms    Baseline 10 days s/p- in thumb spica    Time 10    Period Weeks    Target Date 10/28/20                   Plan - 09/09/20 1232     Clinical Impression Statement Pt present at OT 4 1/2 wks  s/p L thumb CMC arthroplasty and 5th digit trigger finger release - pt seen eval and 2 visits- pain much better- pt was last time reminded to wear splint more and play his string instruments less - had increase pain and to much collapsing of thumb CMC. Pt can now start weaning into soft CMC neoprene and out of spica-and if playing guitar like this date in cliinic wear soft neoprene to cue [place] out of palm -add PROM of MC of thumb , with IP and then opposition to all digits- cueing CMC out of palm - pt can benefit from OT services to decrease edema, pain and increase AROM and strength in thumb CMC  for use in ADL's and IADL's    OT Occupational Profile and History Problem Focused Assessment - Including review of records relating to presenting problem    Occupational performance deficits (Please refer to evaluation for details): ADL's;IADL's;Play    Body Structure / Function /  Physical Skills ADL;Strength;Pain;IADL;FMC;UE functional use    Rehab Potential Fair    Clinical Decision Making Limited treatment options, no task modification necessary    Comorbidities Affecting Occupational Performance: May have comorbidities impacting occupational performance    Modification or Assistance to Complete Evaluation  No modification of tasks or assist necessary to complete eval    OT Frequency 1x / week    OT Duration 8 weeks    OT Treatment/Interventions Self-care/ADL training;Paraffin;Manual Therapy;Patient/family education;Therapeutic exercise;Splinting;DME and/or AE instruction;Fluidtherapy;Contrast Bath;Scar mobilization    Consulted and Agree with Plan of Care Patient             Patient will benefit from skilled therapeutic intervention in order to improve the following deficits and impairments:   Body Structure / Function / Physical Skills: ADL, Strength, Pain, IADL, FMC, UE functional use       Visit Diagnosis: Pain in left hand  Localized edema  Muscle weakness (generalized)  Trigger finger, left little finger    Problem List Patient Active Problem List   Diagnosis  Date Noted   Dysphagia 10/13/2017   Enthesopathy of ankle and tarsus 08/22/2015   Capsulitis 08/22/2015   Diabetes ([place]) 03/10/2015   Arthritis of shoulder region, degenerative 12/01/2014   Coronary artery disease 09/23/2013   HTN (hypertension) 09/12/2013   Hyperlipidemia, unspecified 09/12/2013   Migraines 09/12/2013   Sleep apnea 09/12/2013    Rosalyn Gess OTR/L,CLT 09/09/2020, 2:25 PM  [facility] [address] [place], [address] Phone: [phone]   Fax:  [phone]  Name: Xavier Hebert MRN: [id number] Date of Birth: 11-03-1945

## 2020-09-15 ENCOUNTER — Emergency Department
Admission: EM | Admit: 2020-09-15 | Discharge: 2020-09-15 | Disposition: A | Discharge status: Home / Self Care | Payer: Medicare Other | Attending: Emergency Medicine | Admitting: Emergency Medicine

## 2020-09-15 ENCOUNTER — Encounter: Payer: Self-pay | Admitting: *Deleted

## 2020-09-15 ENCOUNTER — Other Ambulatory Visit: Payer: Self-pay

## 2020-09-15 ENCOUNTER — Ambulatory Visit: Payer: Medicare Other | Admitting: Occupational Therapy

## 2020-09-15 DIAGNOSIS — M6281 Muscle weakness (generalized): Secondary | ICD-10-CM

## 2020-09-15 DIAGNOSIS — Z794 Long term (current) use of insulin: Secondary | ICD-10-CM | POA: Insufficient documentation

## 2020-09-15 DIAGNOSIS — Z79899 Other long term (current) drug therapy: Secondary | ICD-10-CM | POA: Diagnosis not present

## 2020-09-15 DIAGNOSIS — I2510 Atherosclerotic heart disease of native coronary artery without angina pectoris: Secondary | ICD-10-CM | POA: Diagnosis not present

## 2020-09-15 DIAGNOSIS — K5900 Constipation, unspecified: Secondary | ICD-10-CM

## 2020-09-15 DIAGNOSIS — E1122 Type 2 diabetes mellitus with diabetic chronic kidney disease: Secondary | ICD-10-CM | POA: Diagnosis not present

## 2020-09-15 DIAGNOSIS — R600 Localized edema: Secondary | ICD-10-CM

## 2020-09-15 DIAGNOSIS — I129 Hypertensive chronic kidney disease with stage 1 through stage 4 chronic kidney disease, or unspecified chronic kidney disease: Secondary | ICD-10-CM | POA: Insufficient documentation

## 2020-09-15 DIAGNOSIS — N183 Chronic kidney disease, stage 3 unspecified: Secondary | ICD-10-CM | POA: Insufficient documentation

## 2020-09-15 DIAGNOSIS — Z7984 Long term (current) use of oral hypoglycemic drugs: Secondary | ICD-10-CM | POA: Insufficient documentation

## 2020-09-15 DIAGNOSIS — Z91040 Latex allergy status: Secondary | ICD-10-CM | POA: Diagnosis not present

## 2020-09-15 DIAGNOSIS — M79642 Pain in left hand: Secondary | ICD-10-CM

## 2020-09-15 DIAGNOSIS — Z96653 Presence of artificial knee joint, bilateral: Secondary | ICD-10-CM | POA: Insufficient documentation

## 2020-09-15 LAB — CBC
HCT: 42.9 % (ref 39.0–52.0)
Hemoglobin: 14.4 g/dL (ref 13.0–17.0)
MCH: 31.4 pg (ref 26.0–34.0)
MCHC: 33.6 g/dL (ref 30.0–36.0)
MCV: 93.7 fL (ref 80.0–100.0)
Platelets: 144 10*3/uL — ABNORMAL LOW (ref 150–400)
RBC: 4.58 MIL/uL (ref 4.22–5.81)
RDW: 13.9 % (ref 11.5–15.5)
WBC: 5.5 10*3/uL (ref 4.0–10.5)
nRBC: 0 % (ref 0.0–0.2)

## 2020-09-15 LAB — COMPREHENSIVE METABOLIC PANEL
ALT: 15 U/L (ref 0–44)
AST: 20 U/L (ref 15–41)
Albumin: 4 g/dL (ref 3.5–5.0)
Alkaline Phosphatase: 108 U/L (ref 38–126)
Anion gap: 5 (ref 5–15)
BUN: 25 mg/dL — ABNORMAL HIGH (ref 8–23)
CO2: 23 mmol/L (ref 22–32)
Calcium: 8.8 mg/dL — ABNORMAL LOW (ref 8.9–10.3)
Chloride: 110 mmol/L (ref 98–111)
Creatinine, Ser: 1.8 mg/dL — ABNORMAL HIGH (ref 0.61–1.24)
GFR, Estimated: 39 mL/min — ABNORMAL LOW (ref >60)
Glucose, Bld: 184 mg/dL — ABNORMAL HIGH (ref 70–99)
Potassium: 3.7 mmol/L (ref 3.5–5.1)
Sodium: 138 mmol/L (ref 135–145)
Total Bilirubin: 0.6 mg/dL (ref 0.3–1.2)
Total Protein: 7.3 g/dL (ref 6.5–8.1)

## 2020-09-15 LAB — LIPASE, BLOOD: Lipase: 46 U/L (ref 11–51)

## 2020-09-15 NOTE — ED Provider Notes (Signed)
[facility] Emergency Department Provider Note  ____________________________________________   Event Date/Time   First MD Initiated Contact with Patient 09/15/20 [phone]     (approximate)  I have reviewed the triage vital signs and the nursing notes.   HISTORY  Chief Complaint Constipation    HPI Xavier Hebert is a 75 y.o. male   with chronic constipation who presents for evaluation of constipation.  He said he has not had a bowel movement in about 6 days.  He takes laxatives and stool softeners and normally he is able to use enemas to great success at home, but today he was not able to do so.  He tried 2 different kinds of enemas and nothing came out except for the fluid from the enema.  He does not have pain, just some pressure around his rectum.  No abdominal pain, no fever, no nausea nor vomiting.  Nothing in particular makes his symptoms better or worse and he describes them as severe.  He said that he put on a glove and tried to manually disimpact himself but he felt like he was just pushing the stool ball up higher again.        Past Medical History:  Diagnosis Date   Anxiety    Arthritis    Chronic pain syndrome    CKD (chronic kidney disease) stage 3, GFR 30-59 ml/min ([facility])    Depression    Diabetes mellitus without complication ([facility])    Type II   GERD (gastroesophageal reflux disease)    Headache    Migraine- headache   Hyperlipidemia    Hypertension    Kidney stones    Peptic ulcer    Sleep apnea    does not use cpap/ pt states he lost 60#    Patient Active Problem List   Diagnosis Date Noted   Dysphagia 10/13/2017   Enthesopathy of ankle and tarsus 08/22/2015   Capsulitis 08/22/2015   Diabetes ([place]) 03/10/2015   Arthritis of shoulder region, degenerative 12/01/2014   Coronary artery disease 09/23/2013   HTN (hypertension) 09/12/2013   Hyperlipidemia, unspecified 09/12/2013   Migraines 09/12/2013   Sleep apnea 09/12/2013     Past Surgical History:  Procedure Laterality Date   ANTERIOR CERVICAL DECOMP/DISCECTOMY FUSION  2009   CARPAL TUNNEL RELEASE Left 01/03/2018   Procedure: CARPAL TUNNEL RELEASE;  Surgeon: Hessie Knows, MD;  Location: [facility] OR[facility];  Service: Orthopedics;  Laterality: Left;   CARPOMETACARPAL ([place]) FUSION OF THUMB Left 08/10/2020   Procedure: CARPOMETACARPAL [facility]) FUSION OF THUMB;  Surgeon: Hessie Knows, MD;  Location: [facility];  Service: Orthopedics;  Laterality: Left;   COLONOSCOPY WITH PROPOFOL N/A 11/21/2017   Procedure: COLONOSCOPY WITH PROPOFOL;  Surgeon: Toledo, Benay Pike, MD;  Location: [facility] ENDOSCOPY;  Service: Gastroenterology;  Laterality: N/A;   CYSTOSCOPY     x3- 2 times for stone removal    ESOPHAGOGASTRODUODENOSCOPY N/A 10/14/2017   Procedure: ESOPHAGOGASTRODUODENOSCOPY (EGD);  Surgeon: Lin Landsman, MD;  Location: [facility] ENDOSCOPY;  Service: Gastroenterology;  Laterality: N/A;   ESOPHAGOGASTRODUODENOSCOPY (EGD) WITH PROPOFOL N/A 10/25/2017   Procedure: ESOPHAGOGASTRODUODENOSCOPY (EGD) WITH PROPOFOL;  Surgeon: Lin Landsman, MD;  Location: [facility] ENDOSCOPY;  Service: Gastroenterology;  Laterality: N/A;   JOINT REPLACEMENT     bil. knees , shoulder rt, and neck    REVERSE SHOULDER ARTHROPLASTY Right 12/01/2014   REVERSE SHOULDER ARTHROPLASTY Right 12/01/2014   Procedure: REVERSE SHOULDER ARTHROPLASTY;  Surgeon: Meredith Pel, MD;  Location: [facility];  Service: Orthopedics;  Laterality: Right;   SHOULDER ARTHROSCOPY W/ ROTATOR CUFF REPAIR Right 06/10/2014   "failed"   SHOULDER OPEN ROTATOR CUFF REPAIR Left ~ 2008   TOTAL KNEE ARTHROPLASTY Bilateral 2005-2008   "right-left"   TRIGGER FINGER RELEASE Right 2001   TRIGGER FINGER RELEASE Left 08/10/2020   Procedure: RELEASE TRIGGER FINGER/A-1 PULLEY;  Surgeon: Hessie Knows, MD;  Location: [facility];  Service: Orthopedics;  Laterality: Left;   URETERAL STENT PLACEMENT      Prior to Admission medications   Medication Sig Start  Date End Date Taking? Authorizing Provider  allopurinol (ZYLOPRIM) 100 MG tablet Take 100 mg by mouth daily.  07/11/17   [provider]  butalbital-acetaminophen-caffeine (FIORICET, ESGIC) 50-325-40 MG per tablet Take 1-2 tablets by mouth See admin instructions. Take 2 tablets by mouth at start of headache and take 1 additional tablet by mouth after 2 hours if needed - max 3 tablets daily    [provider]  cholecalciferol (VITAMIN D) 1000 units tablet Take 3,000 Units by mouth daily.    [provider]  clobetasol cream (TEMOVATE) 0.48 % Apply 1 application topically daily as needed (eczema). Apply to affected area(s) 2 to 3 times weekly as needed    [provider]  Coconut Oil 1000 MG CAPS Take 2,000 mg by mouth 2 (two) times daily.    [provider]  colchicine 0.6 MG tablet TAKE 1 TABLET BY MOUTH DAILY Patient taking differently: Take 0.6 mg by mouth daily as needed (gout). 06/18/20   Edrick Kins, DPM  empagliflozin (JARDIANCE) 25 MG TABS tablet Take 12.5 mg by mouth daily.    [provider]  flunisolide (NASALIDE) 25 MCG/ACT (0.025%) SOLN Place 2 sprays into the nose 2 (two) times daily.    [provider]  hydrOXYzine (ATARAX/VISTARIL) 50 MG tablet Take 50 mg by mouth 4 (four) times daily. 04/05/17   [provider]  insulin glargine (LANTUS SOLOSTAR) 100 UNIT/ML Solostar Pen Inject 25 Units into the skin at bedtime.    [provider]  ketoconazole (NIZORAL) 2 % shampoo Apply 1 application topically once a week.  11/10/14   [provider]  L-Tryptophan 500 MG CAPS Take 500 mg by mouth at bedtime.    [provider]  lisinopril (PRINIVIL,ZESTRIL) 40 MG tablet Take 40 mg by mouth daily.    [provider]  magnesium oxide (MAG-OX) 400 MG tablet Take 400 mg by mouth daily.    [provider]  methocarbamol (ROBAXIN) 750 MG tablet Take 750 mg by mouth 4 (four) times daily.     [provider]  Misc Natural Products (OSTEO BI-FLEX ADV TRIPLE ST) TABS Take 1 tablet by mouth in the morning and at bedtime.    [provider]  Multiple Vitamin (MULTIVITAMIN) tablet Take 1 tablet by mouth daily.    [provider]  mupirocin ointment (BACTROBAN) 2 % Apply 1 application topically 2 (two) times daily. 05/20/19   Edrick Kins, DPM  OVER THE COUNTER MEDICATION Take 2 capsules by mouth in the morning and at bedtime. BIO Probiotic    [provider]  oxyCODONE (ROXICODONE) 15 MG immediate release tablet Take 15 mg by mouth every 6 (six) hours.    [provider]  oxyCODONE 7.5 MG TABS Take 7.5 mg by mouth every 4 (four) hours as needed for breakthrough pain. 08/10/20 08/10/21  Hessie Knows, MD  pantoprazole (PROTONIX) 40 MG tablet Take 40 mg by mouth 2 (two) times daily.  [provider]  Pregnenolone Micronized (PREGNENOLONE PO) Take 25 ng by mouth daily.    [provider]  rosuvastatin (CRESTOR) 20 MG tablet Take 10 mg by mouth at bedtime.     [provider]  Saw Palmetto 450 MG CAPS Take 900 mg by mouth in the morning and at bedtime. Patient not taking: Reported on 08/10/2020    [provider]  Semaglutide,0.25 or 0.5MG /DOS, (OZEMPIC, 0.25 OR 0.5 MG/DOSE,) 2 MG/1.5ML SOPN Inject 1 mg into the skin every Friday.    [provider]  sildenafil (VIAGRA) 100 MG tablet Take 100 mg by mouth daily as needed (ED).    [provider]  silver sulfADIAZINE (SILVADENE) 1 % cream Apply 1 application topically daily. Patient taking differently: Apply 1 application topically daily as needed. 07/15/19   Edrick Kins, DPM  Testosterone 30 MG/ACT SOLN Apply 1 Pump topically in the morning and at bedtime. Apply 1 pump transdermal under each arm daily    [provider]    Allergies Ivp dye [iodinated diagnostic agents], Aspirin, Glipizide, Hydrochlorothiazide, Ibuprofen, Ioxaglate,  Latex, Nsaids, Omeprazole, Pregabalin, Quinine, and Pioglitazone  Family History  Problem Relation Age of Onset   Dementia Mother    Alcohol abuse Father     Social History Social History   Tobacco Use   Smoking status: Never   Smokeless tobacco: Never  Vaping Use   Vaping Use: Never used  Substance Use Topics   Alcohol use: No   Drug use: No    Review of Systems Constitutional: No fever/chills Eyes: No visual changes. ENT: No sore throat. Cardiovascular: Denies chest pain. Respiratory: Denies shortness of breath. Gastrointestinal: Positive for constipation and rectal pressure.  Negative for abdominal pain, nausea, and vomiting. Genitourinary: Negative for dysuria. Musculoskeletal: Negative for neck pain.  Negative for back pain. Integumentary: Negative for rash. Neurological: Negative for headaches, focal weakness or numbness.   ____________________________________________   PHYSICAL EXAM:  VITAL SIGNS: ED Triage Vitals [09/15/20 0021]  Enc Vitals Group     BP 132/89     Pulse Rate 99     Resp 16     Temp 98.6 F (37 C)     Temp Source Oral     SpO2 96 %     Weight      Height      Head Circumference      Peak Flow      Pain Score      Pain Loc      Pain Edu?      Excl. in [place]?     Constitutional: Alert and oriented.  Eyes: Conjunctivae are normal.  Head: Atraumatic. Nose: No congestion/rhinnorhea. Mouth/Throat: Patient is wearing a mask. Neck: No stridor.  No meningeal signs.   Cardiovascular: Normal rate, regular rhythm. Good peripheral circulation. Respiratory: Normal respiratory effort.  No retractions. Gastrointestinal: Soft and nontender. Musculoskeletal: No lower extremity tenderness nor edema. No gross deformities of extremities. Neurologic:  Normal speech and language. No gross focal neurologic deficits are appreciated.  Skin:  Skin is warm, dry and intact. Psychiatric: Mood and affect are normal. Speech and behavior are  normal.  ____________________________________________   LABS (all labs ordered are listed, but only abnormal results are displayed)  Labs Reviewed  COMPREHENSIVE METABOLIC PANEL - Abnormal; Notable for the following components:      Result Value   Glucose, Bld 184 (*)    BUN 25 (*)    Creatinine, Ser 1.80 (*)  Calcium 8.8 (*)    GFR, Estimated 39 (*)    All other components within normal limits  CBC - Abnormal; Notable for the following components:   Platelets 144 (*)    All other components within normal limits  LIPASE, BLOOD  URINALYSIS, COMPLETE (UACMP) WITH MICROSCOPIC   ____________________________________________   INITIAL IMPRESSION / MDM / ASSESSMENT AND PLAN / ED COURSE  As part of my medical decision making, I reviewed the following data within the [place] notes reviewed and incorporated, Labs reviewed , Old chart reviewed, and Notes from prior ED visits   Differential diagnosis includes, but is not limited to, chronic constipation, bowel obstruction, rectal fecal impaction.  Vital signs are stable, lab results including CMP, CBC, and lipase are all stable (patient has chronic kidney disease with a stable creatinine).  While we were talking, he said that he thinks he might actually be able to have some success on his own.  I left the room to see a couple more patients and he had a "massive" bowel movement while I was gone.  He said he feels great and is ready to go.  I gave my usual and customary return precautions.           ____________________________________________  FINAL CLINICAL IMPRESSION(S) / ED DIAGNOSES  Final diagnoses:  Constipation, unspecified constipation type     MEDICATIONS GIVEN DURING THIS VISIT:  Medications - No data to display   ED Discharge Orders     None        Note:  This document was prepared using Dragon voice recognition software and may include unintentional dictation errors.    Hinda Kehr, MD 09/15/20 [phone]

## 2020-09-15 NOTE — ED Notes (Addendum)
Pt reports no BM in 5-6 days states he tried to make homemade enema and mineral water enema without relief at 1130pm last night. Pt states he is not passing any gas either. Pt reports he has feels like there is an impaction. Pt states he has taken lactulose in the past for relief.   Pt states he is on narcotic pain meds and does take a stool softener.

## 2020-09-15 NOTE — Discharge Instructions (Addendum)
You were seen in the emergency department today for constipation.  We recommend that you use one or more of the following over-the-counter medications in the order described:   1)  Miralax (powder):  This medication works by drawing additional fluid into your intestines and helps to flush out your stool.  Mix the powder with water or juice according to label instructions.  Be sure to use the recommended amount of water or juice when you mix up the powder.  Plenty of fluids will help to prevent constipation. 2)  Colace (or Dulcolax) 100 mg:  This is a stool softener, and you may take it once or twice a day as needed. 3)  Senna tablets:  This is a bowel stimulant that will help "push" out your stool. It is the next step to add after you have tried a stool softener.  If the three options above are not working, even when you use them together, look for magnesium citrate at the pharmacy (it is usually in a small glass bottle).  Drink the bottle according to the label instructions.  You may also want to consider using glycerin suppositories, which you insert into your rectum.  You hold it in place and is dissolves and softens your stool and stimulates your bowels.  You could also consider using an enema, which is also available over the counter.  Remember that narcotic pain medications are constipating, so avoid them or minimize their use.  Drink plenty of fluids.  Please return to the Emergency Department immediately if you develop new or worsening symptoms that concern you, such as (but not limited to) fever > 101 degrees, severe abdominal pain, or persistent vomiting.

## 2020-09-15 NOTE — ED Notes (Signed)
Pt had large bowel movement, pt reports relief. Dr Karma Greaser notified.

## 2020-09-15 NOTE — Therapy (Signed)
[place] [facility] [address], [place], [address] Phone: [phone]   Fax:  [phone]  Occupational Therapy Treatment  Patient Details  Name: Xavier Hebert MRN: [id number] Date of Birth: 12/21/1945 Referring Provider (OT): Dr Rudene Christians   Encounter Date: 09/15/2020   OT End of Session - 09/15/20 1702     Visit Number 4    Number of Visits 16    Date for OT Re-Evaluation 10/14/20    OT Start Time 0814    OT Stop Time 1655    OT Time Calculation (min) 40 min    Activity Tolerance Patient tolerated treatment well    Behavior During Therapy [facility] for tasks assessed/performed             Past Medical History:  Diagnosis Date   Anxiety    Arthritis    Chronic pain syndrome    CKD (chronic kidney disease) stage 3, GFR 30-59 ml/min (HCC)    Depression    Diabetes mellitus without complication ([place])    Type II   GERD (gastroesophageal reflux disease)    Headache    Migraine- headache   Hyperlipidemia    Hypertension    Kidney stones    Peptic ulcer    Sleep apnea    does not use cpap/ pt states he lost 60#    Past Surgical History:  Procedure Laterality Date   ANTERIOR CERVICAL DECOMP/DISCECTOMY FUSION  2009   CARPAL TUNNEL RELEASE Left 01/03/2018   Procedure: CARPAL TUNNEL RELEASE;  Surgeon: Hessie Knows, MD;  Location: [facility]S;  Service: Orthopedics;  Laterality: Left;   CARPOMETACARPAL ([place]) FUSION OF THUMB Left 08/10/2020   Procedure: CARPOMETACARPAL [facility]) FUSION OF THUMB;  Surgeon: Hessie Knows, MD;  Location: [facility];  Service: Orthopedics;  Laterality: Left;   COLONOSCOPY WITH PROPOFOL N/A 11/21/2017   Procedure: COLONOSCOPY WITH PROPOFOL;  Surgeon: Toledo, Benay Pike, MD;  Location: [facility] ENDOSCOPY;  Service: Gastroenterology;  Laterality: N/A;   CYSTOSCOPY     x3- 2 times for stone removal    ESOPHAGOGASTRODUODENOSCOPY N/A 10/14/2017   Procedure: ESOPHAGOGASTRODUODENOSCOPY (EGD);  Surgeon:  Lin Landsman, MD;  Location: [facility] ENDOSCOPY;  Service: Gastroenterology;  Laterality: N/A;   ESOPHAGOGASTRODUODENOSCOPY (EGD) WITH PROPOFOL N/A 10/25/2017   Procedure: ESOPHAGOGASTRODUODENOSCOPY (EGD) WITH PROPOFOL;  Surgeon: Lin Landsman, MD;  Location: [facility] ENDOSCOPY;  Service: Gastroenterology;  Laterality: N/A;   JOINT REPLACEMENT     bil. knees , shoulder rt, and neck    REVERSE SHOULDER ARTHROPLASTY Right 12/01/2014   REVERSE SHOULDER ARTHROPLASTY Right 12/01/2014   Procedure: REVERSE SHOULDER ARTHROPLASTY;  Surgeon: Meredith Pel, MD;  Location: [place];  Service: Orthopedics;  Laterality: Right;   SHOULDER ARTHROSCOPY W/ ROTATOR CUFF REPAIR Right 06/10/2014   "failed"   SHOULDER OPEN ROTATOR CUFF REPAIR Left ~ 2008   TOTAL KNEE ARTHROPLASTY Bilateral 2005-2008   "right-left"   TRIGGER FINGER RELEASE Right 2001   TRIGGER FINGER RELEASE Left 08/10/2020   Procedure: RELEASE TRIGGER FINGER/A-1 PULLEY;  Surgeon: Hessie Knows, MD;  Location: [facility];  Service: Orthopedics;  Laterality: Left;   URETERAL STENT PLACEMENT      There were no vitals filed for this visit.   Subjective Assessment - 09/15/20 1700     Subjective  I am wearing my hard splint more than I should just for protrection because I am doing stuff like moving furniture or polishing wood- when I play guitar I cannot stop and play hour  Pertinent History Xavier Hebert is a 75 y.o. male who presents today status post left thumb CMC arthroplasty and left little finger trigger release by Dr. Hessie Knows on 08/10/2020. He is on chronic pain medication of oxycodone 15 mg every 6 hours and has been given 7.5 mg for breakthrough pain every 6 hours. Patient's severe pain and swelling did improve - but refer to OT/hand therapy - . Still having some pain and discomfort throughout the hand and little finger. No catching triggering locking of the fifth digit.    Patient Stated Goals Want the pain and use of my L hand   better so I can play my instruments    Currently in Pain? No/denies                [facility] OT Assessment - 09/15/20 0001       Left Hand AROM   L Thumb MCP 0-60 40 Degrees    L Thumb Opposition to Index --   Opposition to all and keeping CMC out                     OT Treatments/Exercises (OP) - 09/15/20 0001       LUE Fluidotherapy   Number Minutes Fluidotherapy 8 Minutes    LUE Fluidotherapy Location Hand;Wrist    Comments THumb and wrist in all planes -AROM              Pt AROM for digits WNL and scar at trigger 5th digit no pain or edema Pt show decrease MC flexion of L thumb compare to R And  show increase  CMC stabilization with opposition to 4th and 5th -better than last time Cont PROM to [facility] of thumb on L - great progress Played last time his guitar- did not had pain at L thumb - but using add of CMC and some hyper extention at [facility] and IP flexion  Pt to use CMC neoprene when play string instrument and not to long - built up gradually time - and pain free     Thumb AROM review Contrast -A/AROM blocked IP and MC flexion of thumb Circular AROM for thumb PA and RA of thumb 10 reps 2 x day Opposition to all digits this date but t/c for [facility] to stay out of palm to 4th and 5th  And add rubber band for PA and RA of thumb 10 reps -2 sets  2 x day painfree   Pain keeping under 2/10      OT Education - 09/15/20 1702     Education Details progress and changes to HEP    Person(s) Educated Patient    Methods Explanation;Demonstration;Tactile cues;Verbal cues;Handout    Comprehension Verbal cues required;Returned demonstration;Verbalized understanding              OT Short Term Goals - 08/19/20 1731       OT SHORT TERM GOAL #1   Title Pt to be independent in HEP to decrease edema, pain for pt to wean out of splint and do HEP for AROM without increase symptoms    Baseline pain 5-10/10 in thumb and 5th digiit - increase edema in 5th and MC  -about 1 cm - and decrease AROM MC's 60-70 L hand    Time 4    Period Weeks    Status New    Target Date 09/16/20               OT Long Term Goals -  08/19/20 1732       OT LONG TERM GOAL #1   Title AROM in digits increase for pt to touch palm without increase symptoms    Baseline MC's 60-70 - edema increase 1 cm over 5th PIP and MC's - pain 5-10/10    Time 4    Period Weeks    Status New    Target Date 09/16/20      OT LONG TERM GOAL #2   Title L thumb AROM PA and RA improve to [facility] and opposition to base of 5th to use in pick up light glass and retrieve 2 cm objects out of palm    Baseline NT - 10 days out of surgery in thumb spica    Time 6    Target Date 09/30/20      OT LONG TERM GOAL #3   Title L wrist AROM and strength increase to WNL to push door open and turn doorkonbs    Baseline NT- in thumb spica - 10 days s/p    Time 6    Period Weeks    Status New    Target Date 09/30/20      OT LONG TERM GOAL #4   Title L grip and prehnesion strength increase to more than 70% compare to R hand to initiate playing instruments without increase symptoms    Baseline 10 days s/p- in thumb spica    Time 10    Period Weeks    Target Date 10/28/20                   Plan - 09/15/20 1738     Clinical Impression Statement Pt present at OT 5 1/2 wks  s/p L thumb CMC arthroplasty and 5th digit trigger finger release - pt seen eval and 3 visits- pain much better and can start weaning during day in [facility] soft neoprene and no splint - but still wear hard one with anything straneous - keep pain under 2/10 - AROM and Opposition to 4th and 5th much better and keeping MC out of palm this date - started strengthening for thumb PA and RA this date - and can play string instrument but soft neoprene splint on - pt can benefit from OT services to decrease edema, pain and increase AROM and strength in thumb CMC  for use in ADL's and IADL's    OT Occupational Profile and History Problem  Focused Assessment - Including review of records relating to presenting problem    Occupational performance deficits (Please refer to evaluation for details): ADL's;IADL's;Play    Body Structure / Function / Physical Skills ADL;Strength;Pain;IADL;FMC;UE functional use    Rehab Potential Fair    Clinical Decision Making Limited treatment options, no task modification necessary    Comorbidities Affecting Occupational Performance: May have comorbidities impacting occupational performance    Modification or Assistance to Complete Evaluation  No modification of tasks or assist necessary to complete eval    OT Frequency 1x / week    OT Duration 8 weeks    OT Treatment/Interventions Self-care/ADL training;Paraffin;Manual Therapy;Patient/family education;Therapeutic exercise;Splinting;DME and/or AE instruction;Fluidtherapy;Contrast Bath;Scar mobilization    Consulted and Agree with Plan of Care Patient             Patient will benefit from skilled therapeutic intervention in order to improve the following deficits and impairments:   Body Structure / Function / Physical Skills: ADL, Strength, Pain, IADL, FMC, UE functional use       Visit Diagnosis:  Pain in left hand  Localized edema  Muscle weakness (generalized)    Problem List Patient Active Problem List   Diagnosis Date Noted   Dysphagia 10/13/2017   Enthesopathy of ankle and tarsus 08/22/2015   Capsulitis 08/22/2015   Diabetes ([place]) 03/10/2015   Arthritis of shoulder region, degenerative 12/01/2014   Coronary artery disease 09/23/2013   HTN (hypertension) 09/12/2013   Hyperlipidemia, unspecified 09/12/2013   Migraines 09/12/2013   Sleep apnea 09/12/2013    Rosalyn Gess OTR/L,CLT 09/15/2020, 5:43 PM  [place] [facility] [address] [place], [address] Phone: [phone]   Fax:  [phone]  Name: Xavier Hebert MRN: [id number] Date of Birth:  20-Nov-1945

## 2020-09-15 NOTE — ED Triage Notes (Signed)
Pt reports rectal pressure, he has not had a BM in about 5-6 days. He used a coconut oil and mineral water enema without relief. Pt is on chronic pain meds. Says he has had decreased appetite and weight loss over the last 3 months.

## 2020-09-21 ENCOUNTER — Ambulatory Visit: Payer: Medicare Other | Attending: Orthopedic Surgery | Admitting: Occupational Therapy

## 2020-09-21 DIAGNOSIS — M6281 Muscle weakness (generalized): Secondary | ICD-10-CM

## 2020-09-21 DIAGNOSIS — M65352 Trigger finger, left little finger: Secondary | ICD-10-CM

## 2020-09-21 DIAGNOSIS — M25642 Stiffness of left hand, not elsewhere classified: Secondary | ICD-10-CM | POA: Insufficient documentation

## 2020-09-21 DIAGNOSIS — M79642 Pain in left hand: Secondary | ICD-10-CM | POA: Diagnosis not present

## 2020-09-21 DIAGNOSIS — R600 Localized edema: Secondary | ICD-10-CM

## 2020-09-21 NOTE — Therapy (Signed)
[place] [facility] [address], [place], [address] Phone: [phone]   Fax:  [phone]  Occupational Therapy Treatment  Patient Details  Name: Xavier Hebert MRN: [id number] Date of Birth: 1945/07/16 Referring Provider (OT): Dr Rudene Christians   Encounter Date: 09/21/2020   OT End of Session - 09/21/20 1340     Visit Number 5    Number of Visits 16    Date for OT Re-Evaluation 10/14/20    OT Start Time 1315    OT Stop Time 1400    OT Time Calculation (min) 45 min    Activity Tolerance Patient tolerated treatment well    Behavior During Therapy [facility] for tasks assessed/performed             Past Medical History:  Diagnosis Date   Anxiety    Arthritis    Chronic pain syndrome    CKD (chronic kidney disease) stage 3, GFR 30-59 ml/min (HCC)    Depression    Diabetes mellitus without complication ([place])    Type II   GERD (gastroesophageal reflux disease)    Headache    Migraine- headache   Hyperlipidemia    Hypertension    Kidney stones    Peptic ulcer    Sleep apnea    does not use cpap/ pt states he lost 60#    Past Surgical History:  Procedure Laterality Date   ANTERIOR CERVICAL DECOMP/DISCECTOMY FUSION  2009   CARPAL TUNNEL RELEASE Left 01/03/2018   Procedure: CARPAL TUNNEL RELEASE;  Surgeon: Hessie Knows, MD;  Location: [facility];  Service: Orthopedics;  Laterality: Left;   CARPOMETACARPAL ([place]) FUSION OF THUMB Left 08/10/2020   Procedure: CARPOMETACARPAL [facility]) FUSION OF THUMB;  Surgeon: Hessie Knows, MD;  Location: [facility];  Service: Orthopedics;  Laterality: Left;   COLONOSCOPY WITH PROPOFOL N/A 11/21/2017   Procedure: COLONOSCOPY WITH PROPOFOL;  Surgeon: Toledo, Benay Pike, MD;  Location: [facility] ENDOS[facility];  Service: Gastroenterology;  Laterality: N/A;   CYSTOSCOPY     x3- 2 times for stone removal    ESOPHAGOGASTRODUODENOSCOPY N/A 10/14/2017   Procedure: ESOPHAGOGASTRODUODENOSCOPY (EGD);  Surgeon:  Lin Landsman, MD;  Location: [facility] ENDOSCOPY;  Service: Gastroenterology;  Laterality: N/A;   ESOPHAGOGASTRODUODENOSCOPY (EGD) WITH PROPOFOL N/A 10/25/2017   Procedure: ESOPHAGOGASTRODUODENOSCOPY (EGD) WITH PROPOFOL;  Surgeon: Lin Landsman, MD;  Location: [facility] ENDOSCOPY;  Service: Gastroenterology;  Laterality: N/A;   JOINT REPLACEMENT     bil. knees , shoulder rt, and neck    REVERSE SHOULDER ARTHROPLASTY Right 12/01/2014   REVERSE SHOULDER ARTHROPLASTY Right 12/01/2014   Procedure: REVERSE SHOULDER ARTHROPLASTY;  Surgeon: Meredith Pel, MD;  Location: [place];  Service: Orthopedics;  Laterality: Right;   SHOULDER ARTHROSCOPY W/ ROTATOR CUFF REPAIR Right 06/10/2014   "failed"   SHOULDER OPEN ROTATOR CUFF REPAIR Left ~ 2008   TOTAL KNEE ARTHROPLASTY Bilateral 2005-2008   "right-left"   TRIGGER FINGER RELEASE Right 2001   TRIGGER FINGER RELEASE Left 08/10/2020   Procedure: RELEASE TRIGGER FINGER/A-1 PULLEY;  Surgeon: Hessie Knows, MD;  Location: [facility];  Service: Orthopedics;  Laterality: Left;   URETERAL STENT PLACEMENT      There were no vitals filed for this visit.   Subjective Assessment - 09/21/20 1338     Subjective  Using it more- did wear the soft splint most all the time- tried not to play as much my guitar- but my thumb I think is triggerig    Pertinent History Xavier Cotten  Hebert is a 75 y.o. male who presents today status post left thumb CMC arthroplasty and left little finger trigger release by Dr. Hessie Knows on 08/10/2020. He is on chronic pain medication of oxycodone 15 mg every 6 hours and has been given 7.5 mg for breakthrough pain every 6 hours. Patient's severe pain and swelling did improve - but refer to OT/hand therapy - . Still having some pain and discomfort throughout the hand and little finger. No catching triggering locking of the fifth digit.    Patient Stated Goals Want the pain and use of my L hand  better so I can play my instruments    Currently  in Pain? No/denies                [facility] OT Assessment - 09/21/20 0001       Strength   Right Hand Grip (lbs) 70    Right Hand Lateral Pinch 18 lbs    Right Hand 3 Point Pinch 16 lbs    Left Hand Grip (lbs) 45    Left Hand Lateral Pinch 12 lbs    Left Hand 3 Point Pinch 6 lbs      Left Hand AROM   L Thumb MCP 0-60 40 Degrees    L Thumb IP 0-80 60 Degrees    L Thumb Opposition to Index --   Opposition to base of 5th             Pt show increase strength in thumb PA , RA and opposition with stabilization at thumb [facility]   But report some triggering in thumb  No tenderness over A1pulley - but did hold off on adding putty for prehension - done only for gripping   15 reps - but only 2 x day -pain free-can increase to 2nd set in 3 days  2 x day - med teal putty  HOLD off on pinch grip         OT Treatments/Exercises (OP) - 09/21/20 0001       LUE Contrast Bath   Time 8 minutes    Comments decrease pain in thumb and increase AROM             Pt AROM for digits WNL and scar at trigger 5th digit no pain or edema Pt show increase  CMC stabilization with opposition to 4th and 5th -better than last week Cont PROM to [facility] of thumb on L - great progress Played  beginning of last week guitar in session - did not had pain at L thumb - but using add of CMC and some hyper extention at [facility] and IP flexion  Pt to use CMC neoprene when play string instrument and not to long - built up gradually time - and pain free Did play for hour at time  Review again with pt about holding his cell phone an playing guitar- hold off on tight grip or repetitive flexion of thumb      Thumb AROM review Contrast -A/AROM blocked IP and MC flexion of thumb Circular AROM for thumb PA and RA of thumb 10 reps 2 x day Opposition to all digits this date but t/c for [facility] to stay out of palm to 4th and 5th  rubber band for PA and RA of thumb 10 reps -2 sets 2 x day pain free 1 lbs weight for wrist  in all planes- 2 x 12 reps - pain free  Can increase 3rd set in 3 days  Pain keeping under 2/10             OT Education - 09/21/20 1340     Education Details progress and changes to HEP    Person(s) Educated Patient    Methods Explanation;Demonstration;Tactile cues;Verbal cues;Handout    Comprehension Verbal cues required;Returned demonstration;Verbalized understanding              OT Short Term Goals - 08/19/20 1731       OT SHORT TERM GOAL #1   Title Pt to be independent in HEP to decrease edema, pain for pt to wean out of splint and do HEP for AROM without increase symptoms    Baseline pain 5-10/10 in thumb and 5th digiit - increase edema in 5th and MC -about 1 cm - and decrease AROM MC's 60-70 L hand    Time 4    Period Weeks    Status New    Target Date 09/16/20               OT Long Term Goals - 08/19/20 1732       OT LONG TERM GOAL #1   Title AROM in digits increase for pt to touch palm without increase symptoms    Baseline MC's 60-70 - edema increase 1 cm over 5th PIP and MC's - pain 5-10/10    Time 4    Period Weeks    Status New    Target Date 09/16/20      OT LONG TERM GOAL #2   Title L thumb AROM PA and RA improve to [facility] and opposition to base of 5th to use in pick up light glass and retrieve 2 cm objects out of palm    Baseline NT - 10 days out of surgery in thumb spica    Time 6    Target Date 09/30/20      OT LONG TERM GOAL #3   Title L wrist AROM and strength increase to WNL to push door open and turn doorkonbs    Baseline NT- in thumb spica - 10 days s/p    Time 6    Period Weeks    Status New    Target Date 09/30/20      OT LONG TERM GOAL #4   Title L grip and prehnesion strength increase to more than 70% compare to R hand to initiate playing instruments without increase symptoms    Baseline 10 days s/p- in thumb spica    Time 10    Period Weeks    Target Date 10/28/20                   Plan - 09/21/20 1341      Clinical Impression Statement Pt present at OT 6 1/2 wks  s/p L thumb CMC arthroplasty and 5th digit trigger finger release - pt seen eval and 4 visits- pain much better and can start weaning during day in [facility] soft neoprene and no splint - but still wear hard one with anything straneous - keep pain under 2/10 - AROM and Opposition to 4th and 5th much better and keeping MC out of palm last time  - started strengthening for thumb PA and RA last week- but pt present at [facility] of care with what appear some triggering of thumb - but not tender over A1pulley - done contrast and review with pt to avoid repetitive and static THUMB IP and MC flexion with cellphone and guitar use - and can play  string instrument but soft neoprene splint on and short periods last time told - pt can benefit from OT services to decrease edema/pain, increase  AROM and strength in thumb CMC  for use in ADL's and IADL's    OT Occupational Profile and History Problem Focused Assessment - Including review of records relating to presenting problem    Occupational performance deficits (Please refer to evaluation for details): ADL's;IADL's;Play    Body Structure / Function / Physical Skills ADL;Strength;Pain;IADL;FMC;UE functional use    Rehab Potential Good    Clinical Decision Making Limited treatment options, no task modification necessary    Comorbidities Affecting Occupational Performance: May have comorbidities impacting occupational performance    Modification or Assistance to Complete Evaluation  No modification of tasks or assist necessary to complete eval    OT Frequency 1x / week    OT Duration 8 weeks    OT Treatment/Interventions Self-care/ADL training;Paraffin;Manual Therapy;Patient/family education;Therapeutic exercise;Splinting;DME and/or AE instruction;Fluidtherapy;Contrast Bath;Scar mobilization    Consulted and Agree with Plan of Care Patient             Patient will benefit from skilled therapeutic intervention  in order to improve the following deficits and impairments:   Body Structure / Function / Physical Skills: ADL, Strength, Pain, IADL, FMC, UE functional use       Visit Diagnosis: Pain in left hand  Localized edema  Muscle weakness (generalized)  Trigger finger, left little finger  Stiffness of left hand, not elsewhere classified    Problem List Patient Active Problem List   Diagnosis Date Noted   Dysphagia 10/13/2017   Enthesopathy of ankle and tarsus 08/22/2015   Capsulitis 08/22/2015   Diabetes ([place]) 03/10/2015   Arthritis of shoulder region, degenerative 12/01/2014   Coronary artery disease 09/23/2013   HTN (hypertension) 09/12/2013   Hyperlipidemia, unspecified 09/12/2013   Migraines 09/12/2013   Sleep apnea 09/12/2013    Rosalyn Gess  OTR/L,CLT 09/21/2020, 3:05 PM  [place] [facility] [address] [place], [address] Phone: [phone]   Fax:  [phone]  Name: Xavier Hebert MRN: [id number] Date of Birth: 1945-12-22

## 2020-09-29 ENCOUNTER — Ambulatory Visit: Payer: Medicare Other | Admitting: Occupational Therapy

## 2020-09-29 DIAGNOSIS — M65352 Trigger finger, left little finger: Secondary | ICD-10-CM

## 2020-09-29 DIAGNOSIS — M6281 Muscle weakness (generalized): Secondary | ICD-10-CM

## 2020-09-29 DIAGNOSIS — M79642 Pain in left hand: Secondary | ICD-10-CM

## 2020-09-29 DIAGNOSIS — M25642 Stiffness of left hand, not elsewhere classified: Secondary | ICD-10-CM

## 2020-09-29 DIAGNOSIS — R600 Localized edema: Secondary | ICD-10-CM

## 2020-09-29 NOTE — Therapy (Signed)
[place] [facility] [address], [place], [address] Phone: [phone]   Fax:  [phone]  Occupational Therapy Treatment  Patient Details  Name: Xavier Hebert MRN: [id number] Date of Birth: [birth year] Referring Provider (OT): Dr Rudene Christians   Encounter Date: 09/29/2020   OT End of Session - 09/29/20 1627     Visit Number 6    Number of Visits 16    Date for OT Re-Evaluation 10/14/20    OT Start Time 1537    OT Stop Time 5170    OT Time Calculation (min) 44 min    Activity Tolerance Patient tolerated treatment well    Behavior During Therapy [facility] for tasks assessed/performed             Past Medical History:  Diagnosis Date   Anxiety    Arthritis    Chronic pain syndrome    CKD (chronic kidney disease) stage 3, GFR 30-59 ml/min (HCC)    Depression    Diabetes mellitus without complication ([place])    Type II   GERD (gastroesophageal reflux disease)    Headache    Migraine- headache   Hyperlipidemia    Hypertension    Kidney stones    Peptic ulcer    Sleep apnea    does not use cpap/ pt states he lost 60#    Past Surgical History:  Procedure Laterality Date   ANTERIOR CERVICAL DECOMP/DISCECTOMY FUSION  2009   CARPAL TUNNEL RELEASE Left 01/03/2018   Procedure: CARPAL TUNNEL RELEASE;  Surgeon: Hessie Knows, MD;  Location: [facility]S;  Service: Orthopedics;  Laterality: Left;   CARPOMETACARPAL ([place]) FUSION OF THUMB Left 08/10/2020   Procedure: CARPOMETACARPAL [employer]) FUSION OF THUMB;  Surgeon: Hessie Knows, MD;  Location: [facility];  Service: Orthopedics;  Laterality: Left;   COLONOSCOPY WITH PROPOFOL N/A 11/21/2017   Procedure: COLONOSCOPY WITH PROPOFOL;  Surgeon: Toledo, Benay Pike, MD;  Location: [facility];  Service: Gastroenterology;  Laterality: N/A;   CYSTOSCOPY     x3- 2 times for stone removal    ESOPHAGOGASTRODUODENOSCOPY N/A 10/14/2017   Procedure: ESOPHAGOGASTRODUODENOSCOPY (EGD);  Surgeon:  Lin Landsman, MD;  Location: [facility] ENDOSCOPY;  Service: Gastroenterology;  Laterality: N/A;   ESOPHAGOGASTRODUODENOSCOPY (EGD) WITH PROPOFOL N/A 10/25/2017   Procedure: ESOPHAGOGASTRODUODENOSCOPY (EGD) WITH PROPOFOL;  Surgeon: Lin Landsman, MD;  Location: [facility] ENDOSCOPY;  Service: Gastroenterology;  Laterality: N/A;   JOINT REPLACEMENT     bil. knees , shoulder rt, and neck    REVERSE SHOULDER ARTHROPLASTY Right 12/01/2014   REVERSE SHOULDER ARTHROPLASTY Right 12/01/2014   Procedure: REVERSE SHOULDER ARTHROPLASTY;  Surgeon: Meredith Pel, MD;  Location: [place];  Service: Orthopedics;  Laterality: Right;   SHOULDER ARTHROSCOPY W/ ROTATOR CUFF REPAIR Right 06/10/2014   "failed"   SHOULDER OPEN ROTATOR CUFF REPAIR Left ~ 2008   TOTAL KNEE ARTHROPLASTY Bilateral 2005-2008   "right-left"   TRIGGER FINGER RELEASE Right 2001   TRIGGER FINGER RELEASE Left 08/10/2020   Procedure: RELEASE TRIGGER FINGER/A-1 PULLEY;  Surgeon: Hessie Knows, MD;  Location: [facility];  Service: Orthopedics;  Laterality: Left;   URETERAL STENT PLACEMENT      There were no vitals filed for this visit.   Subjective Assessment - 09/29/20 1626     Subjective  Seen Dr Rudene Christians and had shot for my trigger thumb - it is still clicking some- I tried not to play to much - but have to play Saturday at my wedding  Pertinent History Xavier Hebert is a 75 y.o. male who presents today status post left thumb CMC arthroplasty and left little finger trigger release by Dr. Hessie Knows on 08/10/2020. He is on chronic pain medication of oxycodone 15 mg every 6 hours and has been given 7.5 mg for breakthrough pain every 6 hours. Patient's severe pain and swelling did improve - but refer to OT/hand therapy - . Still having some pain and discomfort throughout the hand and little finger. No catching triggering locking of the fifth digit.    Patient Stated Goals Want the pain and use of my L hand  better so I can play my instruments     Currently in Pain? Yes    Pain Score 2     Pain Location Wrist    Pain Orientation Left    Pain Descriptors / Indicators Aching    Pain Type Chronic pain    Pain Onset More than a month ago    Pain Frequency Intermittent                [facility] OT Assessment - 09/29/20 0001       Strength   Left Hand Grip (lbs) 45    Left Hand Lateral Pinch 12 lbs    Left Hand 3 Point Pinch 6 lbs   pain , with CMC 10 lbs no pain            Pt arrive with having shot for trigger thumb MOnday - pt to not do a lot of AROM for thumb flexion , opposition and static pinch of thumb with phone and string instruments   Pt  this date with tenderness over scar at trigger 5th digit and some thickness and report not able to make tight fist -  Focus on scar massage - used mini massager and pt ed on doing scar massage  And add composite extention stretch for digits and wrist - slight pull  10 reps hold 5 sec  Pt show increase  CMC stabilization with opposition to 4th and 5th -better than last week- but triggering still - to hold off on opposiion Only to do PROM composite flexion of thumb    Pt to use CMC neoprene when play string instrument and not to long - built up gradually time - and pain free Did play for hour at time Review again with pt about holding his cell phone an playing guitar- hold off on tight grip or repetitive flexion of thumb  And hold off on 1 lbs weight HEP and gripping of putty      Cont with thumb AROM HEP  Circular AROM for thumb PA and RA of thumb AROM  10 reps 2 x day Only PROM for composite thumb flexion - no AROM   rubber band for PA and RA of thumb 10 reps -2 sets 2 x day pain free Pain keeping under 2/10                 OT Education - 09/29/20 1627     Education Details progress and changes to HEP    Person(s) Educated Patient    Methods Explanation;Demonstration;Tactile cues;Verbal cues;Handout    Comprehension Verbal cues required;Returned  demonstration;Verbalized understanding              OT Short Term Goals - 08/19/20 1731       OT SHORT TERM GOAL #1   Title Pt to be independent in HEP to decrease edema, pain for  pt to wean out of splint and do HEP for AROM without increase symptoms    Baseline pain 5-10/10 in thumb and 5th digiit - increase edema in 5th and MC -about 1 cm - and decrease AROM MC's 60-70 L hand    Time 4    Period Weeks    Status New    Target Date 09/16/20               OT Long Term Goals - 08/19/20 1732       OT LONG TERM GOAL #1   Title AROM in digits increase for pt to touch palm without increase symptoms    Baseline MC's 60-70 - edema increase 1 cm over 5th PIP and MC's - pain 5-10/10    Time 4    Period Weeks    Status New    Target Date 09/16/20      OT LONG TERM GOAL #2   Title L thumb AROM PA and RA improve to [facility] and opposition to base of 5th to use in pick up light glass and retrieve 2 cm objects out of palm    Baseline NT - 10 days out of surgery in thumb spica    Time 6    Target Date 09/30/20      OT LONG TERM GOAL #3   Title L wrist AROM and strength increase to WNL to push door open and turn doorkonbs    Baseline NT- in thumb spica - 10 days s/p    Time 6    Period Weeks    Status New    Target Date 09/30/20      OT LONG TERM GOAL #4   Title L grip and prehnesion strength increase to more than 70% compare to R hand to initiate playing instruments without increase symptoms    Baseline 10 days s/p- in thumb spica    Time 10    Period Weeks    Target Date 10/28/20                   Plan - 09/29/20 1628     Clinical Impression Statement Pt present at OT 7 1/2 wks  s/p L thumb CMC arthroplasty and 5th digit trigger finger release - pt seen eval and 5 visits- pain much better and can start weaning during day in [facility] soft neoprene and no splint - but still wear hard one with anything straneous - keep pain under 2/10 - Was making great progress but since  last time appear to have trigger thumb- pt did had shot Monday for trigger thumb at Ortho appt - pt to hold off on any AROM thumb flexion, opposition and play guitar - can play on weddig - but not practice long and wear thumb CMC neoprene - cont with strengthening for thumb PA and RA - cont AROM for PA and RA of thumb and scar massage to 5th digit trigger finger scar and composite extention stretch for wrist and digits - cont wtih avoiding repetitive and static THUMB IP and MC flexion with cellphone and guitar use -  pt can benefit from OT services to decrease edema/pain, increase  AROM and strength in thumb CMC  for use in ADL's and IADL's    OT Occupational Profile and History Problem Focused Assessment - Including review of records relating to presenting problem    Occupational performance deficits (Please refer to evaluation for details): ADL's;IADL's;Play    Body Structure / Function / Physical  Skills ADL;Strength;Pain;IADL;FMC;UE functional use    Rehab Potential Good    Clinical Decision Making Limited treatment options, no task modification necessary    Comorbidities Affecting Occupational Performance: May have comorbidities impacting occupational performance    Modification or Assistance to Complete Evaluation  No modification of tasks or assist necessary to complete eval    OT Frequency 1x / week    OT Duration 8 weeks    OT Treatment/Interventions Self-care/ADL training;Paraffin;Manual Therapy;Patient/family education;Therapeutic exercise;Splinting;DME and/or AE instruction;Fluidtherapy;Contrast Bath;Scar mobilization    Consulted and Agree with Plan of Care Patient             Patient will benefit from skilled therapeutic intervention in order to improve the following deficits and impairments:   Body Structure / Function / Physical Skills: ADL, Strength, Pain, IADL, FMC, UE functional use       Visit Diagnosis: Pain in left hand  Localized edema  Muscle weakness  (generalized)  Trigger finger, left little finger  Stiffness of left hand, not elsewhere classified    Problem List Patient Active Problem List   Diagnosis Date Noted   Dysphagia 10/13/2017   Enthesopathy of ankle and tarsus 08/22/2015   Capsulitis 08/22/2015   Diabetes ([place]) 03/10/2015   Arthritis of shoulder region, degenerative 12/01/2014   Coronary artery disease 09/23/2013   HTN (hypertension) 09/12/2013   Hyperlipidemia, unspecified 09/12/2013   Migraines 09/12/2013   Sleep apnea 09/12/2013    Xavier Hebert OTR/L,CLT 09/29/2020, 4:34 PM  [place] [facility] [address], [place], [address] Phone: [phone]   Fax:  [phone]  Name: Xavier Hebert MRN: [id number] Date of Birth: 08-08-1945

## 2020-10-06 ENCOUNTER — Ambulatory Visit: Payer: Medicare Other | Admitting: Occupational Therapy

## 2020-10-06 DIAGNOSIS — R600 Localized edema: Secondary | ICD-10-CM

## 2020-10-06 DIAGNOSIS — M79642 Pain in left hand: Secondary | ICD-10-CM

## 2020-10-06 DIAGNOSIS — M25642 Stiffness of left hand, not elsewhere classified: Secondary | ICD-10-CM

## 2020-10-06 DIAGNOSIS — M65352 Trigger finger, left little finger: Secondary | ICD-10-CM

## 2020-10-06 DIAGNOSIS — M6281 Muscle weakness (generalized): Secondary | ICD-10-CM

## 2020-10-06 NOTE — Therapy (Signed)
[place] [facility] [address], [place], [address] Phone: [phone]   Fax:  [phone]  Occupational Therapy Treatment  Patient Details  Name: Xavier Hebert MRN: [id number] Date of Birth: 1945/08/24 Referring Provider (OT): Dr Rudene Christians   Encounter Date: 10/06/2020   OT End of Session - 10/06/20 2044     Visit Number 7    Number of Visits 16    Date for OT Re-Evaluation 10/14/20    OT Start Time 1700    OT Stop Time 7342    OT Time Calculation (min) 47 min    Activity Tolerance Patient tolerated treatment well    Behavior During Therapy [facility] for tasks assessed/performed             Past Medical History:  Diagnosis Date   Anxiety    Arthritis    Chronic pain syndrome    CKD (chronic kidney disease) stage 3, GFR 30-59 ml/min (HCC)    Depression    Diabetes mellitus without complication ([place])    Type II   GERD (gastroesophageal reflux disease)    Headache    Migraine- headache   Hyperlipidemia    Hypertension    Kidney stones    Peptic ulcer    Sleep apnea    does not use cpap/ pt states he lost 60#    Past Surgical History:  Procedure Laterality Date   ANTERIOR CERVICAL DECOMP/DISCECTOMY FUSION  2009   CARPAL TUNNEL RELEASE Left 01/03/2018   Procedure: CARPAL TUNNEL RELEASE;  Surgeon: Hessie Knows, MD;  Location: [facility];  Service: Orthopedics;  Laterality: Left;   CARPOMETACARPAL ([place]) FUSION OF THUMB Left 08/10/2020   Procedure: CARPOMETACARPAL [facility]) FUSION OF THUMB;  Surgeon: Hessie Knows, MD;  Location: [facility];  Service: Orthopedics;  Laterality: Left;   COLONOSCOPY WITH PROPOFOL N/A 11/21/2017   Procedure: COLONOSCOPY WITH PROPOFOL;  Surgeon: Toledo, Benay Pike, MD;  Location: [facility];  Service: Gastroenterology;  Laterality: N/A;   CYSTOSCOPY     x3- 2 times for stone removal    ESOPHAGOGASTRODUODENOSCOPY N/A 10/14/2017   Procedure: ESOPHAGOGASTRODUODENOSCOPY (EGD);  Surgeon:  Lin Landsman, MD;  Location: [facility] END[facility]COPY;  Service: Gastroenterology;  Laterality: N/A;   ESOPHAGOGASTRODUODENOSCOPY (EGD) WITH PROPOFOL N/A 10/25/2017   Procedure: ESOPHAGOGASTRODUODENOSCOPY (EGD) WITH PROPOFOL;  Surgeon: Lin Landsman, MD;  [facility] ENDOSCOPY;  Service: Gastroenterology;  Laterality: N/A;   JOINT REPLACEMENT     bil. knees , shoulder rt, and neck    REVERSE SHOULDER ARTHROPLASTY Right 12/01/2014   REVERSE SHOULDER ARTHROPLASTY Right 12/01/2014   Procedure: REVERSE SHOULDER ARTHROPLASTY;  Surgeon: Meredith Pel, MD;  Location: [place];  Service: Orthopedics;  Laterality: Right;   SHOULDER ARTHROSCOPY W/ ROTATOR CUFF REPAIR Right 06/10/2014   "failed"   SHOULDER OPEN ROTATOR CUFF REPAIR Left ~ 2008   TOTAL KNEE ARTHROPLASTY Bilateral 2005-2008   "right-left"   TRIGGER FINGER RELEASE Right 2001   TRIGGER FINGER RELEASE Left 08/10/2020   Procedure: RELEASE TRIGGER FINGER/A-1 PULLEY;  Surgeon: Hessie Knows, MD;  Location: [facility];  Service: Orthopedics;  Laterality: Left;   URETERAL STENT PLACEMENT      There were no vitals filed for this visit.   Subjective Assessment - 10/06/20 2042     Subjective  I got married and played some - and did wear my soft black splint since last time - pain is better and getting stronger    Pertinent History Xavier Hebert is a  75 y.o. male who presents today status post left thumb CMC arthroplasty and left little finger trigger release by Dr. Hessie Knows on 08/10/2020. He is on chronic pain medication of oxycodone 15 mg every 6 hours and has been given 7.5 mg for breakthrough pain every 6 hours. Patient's severe pain and swelling did improve - but refer to OT/hand therapy - . Still having some pain and discomfort throughout the hand and little finger. No catching triggering locking of the fifth digit.    Patient Stated Goals Want the pain and use of my L hand  better so I can play my instruments    Currently in Pain?  No/denies                [facility] OT Assessment - 10/06/20 0001       Strength   Right Hand Grip (lbs) 70    Right Hand Lateral Pinch 20 lbs    Right Hand 3 Point Pinch 18 lbs    Left Hand Grip (lbs) 50    Left Hand Lateral Pinch 14 lbs    Left Hand 3 Point Pinch 10 lbs               Pt had shot on 09/26/20 for triggr thumb  - pt to not do a lot of AROM for thumb flexion , opposition and static pinch of thumb with phone and string instruments  And hold off on any putty for prehension until next week   tenderness  improved greatly over scar at trigger 5th digit  And can make tight and composite fist  Cont to do scar massage - used mini massager and pt ed on doing scar massage And ccont composite extention stretch for digits and wrist - slight pull 10 reps hold 5 sec  Pt show increase  CMC stabilization with opposition to 4th and 5th -can start gentle opposition with extention of thumb inbetween    Pt to use CMC neoprene when play string instrument and not to long - built up gradually time - and pain free Did play for hour at time Review again with pt about holding his cell phone an playing guitar- hold off on tight grip or repetitive flexion of thumb  Can start gripping of putty 20 reps - 3 x day - loose thumb do not grip with thumb      Cont with thumb AROM HEP  Circular AROM for thumb PA and RA of thumb AROM  10 reps 2 x day Only PROM for composite thumb flexion - no AROM    rubber band for PA and RA of thumb 10 reps -2 sets 2 x day pain free Pain keeping under 2/10                 OT Education - 10/06/20 2044     Education Details progress and changes to HEP    Person(s) Educated Patient    Methods Explanation;Demonstration;Tactile cues;Verbal cues;Handout    Comprehension Verbal cues required;Returned demonstration;Verbalized understanding              OT Short Term Goals - 08/19/20 1731       OT SHORT TERM GOAL #1   Title Pt to be  independent in HEP to decrease edema, pain for pt to wean out of splint and do HEP for AROM without increase symptoms    Baseline pain 5-10/10 in thumb and 5th digiit - increase edema in 5th and MC -about 1 cm -  and decrease AROM MC's 60-70 L hand    Time 4    Period Weeks    Status New    Target Date 09/16/20               OT Long Term Goals - 08/19/20 1732       OT LONG TERM GOAL #1   Title AROM in digits increase for pt to touch palm without increase symptoms    Baseline MC's 60-70 - edema increase 1 cm over 5th PIP and MC's - pain 5-10/10    Time 4    Period Weeks    Status New    Target Date 09/16/20      OT LONG TERM GOAL #2   Title L thumb AROM PA and RA improve to [facility] and opposition to base of 5th to use in pick up light glass and retrieve 2 cm objects out of palm    Baseline NT - 10 days out of surgery in thumb spica    Time 6    Target Date 09/30/20      OT LONG TERM GOAL #3   Title L wrist AROM and strength increase to WNL to push door open and turn doorkonbs    Baseline NT- in thumb spica - 10 days s/p    Time 6    Period Weeks    Status New    Target Date 09/30/20      OT LONG TERM GOAL #4   Title L grip and prehnesion strength increase to more than 70% compare to R hand to initiate playing instruments without increase symptoms    Baseline 10 days s/p- in thumb spica    Time 10    Period Weeks    Target Date 10/28/20                   Plan - 10/06/20 2044     Clinical Impression Statement Pt present at OT 8 1/2 wks  s/p L thumb CMC arthroplasty and 5th digit trigger finger release - pt seen eval and 6 visits- pain much better and can start weaning during day in [facility] soft neoprene and no splint - but still wear hard one with anything straneous - keep pain under 2/10 - Was making great progress and showed increase grip and prehension since alst week with only functional strengthening  but  had shot for trigger thumb - still appear wants to trigger  - but no pain - pt to hold off on any prehension strength and composite flexion of thumb - cont with strengthening for thumb PA and RA -  and add putty for gripping but not prehension and can start playing more but for 20 min at times - cont AROM for PA and RA of thumb and scar massage to 5th digit trigger finger scar and composite extention stretch for wrist and digits - cont wtih avoiding repetitive and static THUMB IP and MC flexion with cellphone and guitar use -  pt can benefit from OT services to decrease edema/pain, increase  AROM and strength in thumb CMC  for use in ADL's and IADL's    OT Occupational Profile and History Problem Focused Assessment - Including review of records relating to presenting problem    Occupational performance deficits (Please refer to evaluation for details): ADL's;IADL's;Play    Body Structure / Function / Physical Skills ADL;Strength;Pain;IADL;FMC;UE functional use    Rehab Potential Good    Clinical Decision Making Limited treatment options,  no task modification necessary    Comorbidities Affecting Occupational Performance: May have comorbidities impacting occupational performance    Modification or Assistance to Complete Evaluation  No modification of tasks or assist necessary to complete eval    OT Frequency 1x / week    OT Duration 6 weeks    OT Treatment/Interventions Self-care/ADL training;Paraffin;Manual Therapy;Patient/family education;Therapeutic exercise;Splinting;DME and/or AE instruction;Fluidtherapy;Contrast Bath;Scar mobilization    Consulted and Agree with Plan of Care Patient             Patient will benefit from skilled therapeutic intervention in order to improve the following deficits and impairments:   Body Structure / Function / Physical Skills: ADL, Strength, Pain, IADL, FMC, UE functional use       Visit Diagnosis: Pain in left hand  Localized edema  Muscle weakness (generalized)  Trigger finger, left little  finger  Stiffness of left hand, not elsewhere classified    Problem List Patient Active Problem List   Diagnosis Date Noted   Dysphagia 10/13/2017   Enthesopathy of ankle and tarsus 08/22/2015   Capsulitis 08/22/2015   Diabetes ([place]) 03/10/2015   Arthritis of shoulder region, degenerative 12/01/2014   Coronary artery disease 09/23/2013   HTN (hypertension) 09/12/2013   Hyperlipidemia, unspecified 09/12/2013   Migraines 09/12/2013   Sleep apnea 09/12/2013    Rosalyn Gess OTR/L,CLT 10/06/2020, 8:49 PM  [place] [facility] [address] [place], [address] Phone: [phone]   Fax:  [phone]  Name: FRANCISCOJAVIER WRONSKI MRN: [id number] Date of Birth: 10-Dec-1945

## 2020-10-12 ENCOUNTER — Ambulatory Visit: Payer: Medicare Other | Admitting: Occupational Therapy

## 2020-10-12 DIAGNOSIS — M65352 Trigger finger, left little finger: Secondary | ICD-10-CM

## 2020-10-12 DIAGNOSIS — M79642 Pain in left hand: Secondary | ICD-10-CM

## 2020-10-12 DIAGNOSIS — M25642 Stiffness of left hand, not elsewhere classified: Secondary | ICD-10-CM

## 2020-10-12 DIAGNOSIS — R600 Localized edema: Secondary | ICD-10-CM

## 2020-10-12 DIAGNOSIS — M6281 Muscle weakness (generalized): Secondary | ICD-10-CM

## 2020-10-12 NOTE — Therapy (Signed)
[place] [facility] [address], [place], [address] Phone: [phone]   Fax:  [phone]  Occupational Therapy Treatment  Patient Details  Name: JAAN FILAR MRN: [id number] Date of Birth: 1946/02/10 Referring Provider (OT): Dr Rudene Christians   Encounter Date: 10/12/2020   OT End of Session - 10/12/20 1631     Visit Number 8    Number of Visits 16    Date for OT Re-Evaluation 10/14/20    OT Start Time [id number]    OT Stop Time 1530    OT Time Calculation (min) 45 min    Activity Tolerance Patient tolerated treatment well    Behavior During Therapy [facility] for tasks assessed/performed             Past Medical History:  Diagnosis Date   Anxiety    Arthritis    Chronic pain syndrome    CKD (chronic kidney disease) stage 3, GFR 30-59 ml/min (HCC)    Depression    Diabetes mellitus without complication ([place])    Type II   GERD (gastroesophageal reflux disease)    Headache    Migraine- headache   Hyperlipidemia    Hypertension    Kidney stones    Peptic ulcer    Sleep apnea    does not use cpap/ pt states he lost 60#    Past Surgical History:  Procedure Laterality Date   ANTERIOR CERVICAL DECOMP/DISCECTOMY FUSION  2009   CARPAL TUNNEL RELEASE Left 01/03/2018   Procedure: CARPAL TUNNEL RELEASE;  Surgeon: Hessie Knows, MD;  Location: [facility] ORS;  Service: Orthopedics;  Laterality: Left;   CARPOMETACARPAL ([place]) FUSION OF THUMB Left 08/10/2020   Procedure: CARPOMETACARPAL [facility]) FUSION OF THUMB;  Surgeon: Hessie Knows, MD;  Location: [facility];  Service: Orthopedics;  Laterality: Left;   COLONOSCOPY WITH PROPOFOL N/A 11/21/2017   Procedure: COLONOSCOPY WITH PROPOFOL;  Surgeon: Toledo, Benay Pike, MD;  Location: [facility] ENDOSCOPY;  Service: Gastroenterology;  Laterality: N/A;   CYSTOSCOPY     x3- 2 times for stone removal    ESOPHAGOGASTRODUODENOSCOPY N/A 10/14/2017   Procedure: ESOPHAGOGASTRODUODENOSCOPY (EGD);  Surgeon:  Lin Landsman, MD;  Location: [facility] ENDOSCOPY;  Service: Gastroenterology;  Laterality: N/A;   ESOPHAGOGASTRODUODENOSCOPY (EGD) WITH PROPOFOL N/A 10/25/2017   Procedure: ESOPHAGOGASTRODUODENOSCOPY (EGD) WITH PROPOFOL;  Surgeon: Lin Landsman, MD;  Location: [facility] ENDOSCOPY;  Service: Gastroenterology;  Laterality: N/A;   JOINT REPLACEMENT     bil. knees , shoulder rt, and neck    REVERSE SHOULDER ARTHROPLASTY Right 12/01/2014   REVERSE SHOULDER ARTHROPLASTY Right 12/01/2014   Procedure: REVERSE SHOULDER ARTHROPLASTY;  Surgeon: Meredith Pel, MD;  Location: [place];  Service: Orthopedics;  Laterality: Right;   SHOULDER ARTHROSCOPY W/ ROTATOR CUFF REPAIR Right 06/10/2014   "failed"   SHOULDER OPEN ROTATOR CUFF REPAIR Left ~ 2008   TOTAL KNEE ARTHROPLASTY Bilateral 2005-2008   "right-left"   TRIGGER FINGER RELEASE Right 2001   TRIGGER FINGER RELEASE Left 08/10/2020   Procedure: RELEASE TRIGGER FINGER/A-1 PULLEY;  Surgeon: Hessie Knows, MD;  Location: [facility] ORS;  Service: Orthopedics;  Laterality: Left;   URETERAL STENT PLACEMENT      There were no vitals filed for this visit.   Subjective Assessment - 10/12/20 1628     Subjective  Doing okay - thumb feels good and stronger- but I did not play my guitar - did not feel any clicking or locking - what do you think    Pertinent History Xavier  Azzam Hebert is a 75 y.o. male who presents today status post left thumb CMC arthroplasty and left little finger trigger release by Dr. Hessie Knows on 08/10/2020. He is on chronic pain medication of oxycodone 15 mg every 6 hours and has been given 7.5 mg for breakthrough pain every 6 hours. Patient's severe pain and swelling did improve - but refer to OT/hand therapy - . Still having some pain and discomfort throughout the hand and little finger. No catching triggering locking of the fifth digit.    Patient Stated Goals Want the pain and use of my L hand  better so I can play my instruments    Currently  in Pain? No/denies                [facility] OT Assessment - 10/12/20 0001       Strength   Left Hand Grip (lbs) 50    Left Hand Lateral Pinch 15 lbs    Left Hand 3 Point Pinch 10 lbs               Pt had shot on 09/26/20 for triggr thumb  - pt  not  to do a lot of AROM for thumb flexion , opposition and static pinch of thumb with phone Hold off on any putty for prehension until next week    tenderness  improved greatly over scar at trigger 5th digit  And can make tight and composite fist  And ccont composite extention stretch for digits and wrist - slight pull 10 reps hold 5 sec  Pt show increase  CMC stabilization with opposition  to all digits    Pt to use CMC neoprene when play string instrument  and wean gradually out of it over the next 2 wks - play guitar and banjo 10-20 min and then increase to 30 min pain free and no triggering    gripping of putty 20 reps - 3 x day - loose thumb do not grip with thumb     Only PROM for composite thumb flexion - no AROM    rubber band for PA and RA of thumb 10 reps -3 sets 2 x day pain free Pain keeping under 2/10                 OT Education - 10/12/20 1631     Education Details progress and changes to HEP    Person(s) Educated Patient    Methods Explanation;Demonstration;Tactile cues;Verbal cues;Handout    Comprehension Verbal cues required;Returned demonstration;Verbalized understanding              OT Short Term Goals - 08/19/20 1731       OT SHORT TERM GOAL #1   Title Pt to be independent in HEP to decrease edema, pain for pt to wean out of splint and do HEP for AROM without increase symptoms    Baseline pain 5-10/10 in thumb and 5th digiit - increase edema in 5th and MC -about 1 cm - and decrease AROM MC's 60-70 L hand    Time 4    Period Weeks    Status New    Target Date 09/16/20               OT Long Term Goals - 08/19/20 1732       OT LONG TERM GOAL #1   Title AROM in digits  increase for pt to touch palm without increase symptoms    Baseline MC's 60-70 - edema increase  1 cm over 5th PIP and MC's - pain 5-10/10    Time 4    Period Weeks    Status New    Target Date 09/16/20      OT LONG TERM GOAL #2   Title L thumb AROM PA and RA improve to [facility] and opposition to base of 5th to use in pick up light glass and retrieve 2 cm objects out of palm    Baseline NT - 10 days out of surgery in thumb spica    Time 6    Target Date 09/30/20      OT LONG TERM GOAL #3   Title L wrist AROM and strength increase to WNL to push door open and turn doorkonbs    Baseline NT- in thumb spica - 10 days s/p    Time 6    Period Weeks    Status New    Target Date 09/30/20      OT LONG TERM GOAL #4   Title L grip and prehnesion strength increase to more than 70% compare to R hand to initiate playing instruments without increase symptoms    Baseline 10 days s/p- in thumb spica    Time 10    Period Weeks    Target Date 10/28/20                   Plan - 10/12/20 1632     Clinical Impression Statement Pt present at OT 9 1/2 wks  s/p L thumb CMC arthroplasty and 5th digit trigger finger release -pain much better and wearing most of the day CMC soft neoprenet - but still wear hard one with anything straneous - keep pain under 2/10 - made great progress grip and prehension last time - pt to cont with functional strengthening  because of trigger thumb few wks ago that he had shot for -  no pain - pt to hold off on any prehension strength and  only PROM for composite flexion of thumb - cont with strengthening for thumb PA and RA -  and add putty for gripping but not prehension and can start playing  3  day for 10-10 min and then increase over the next 2 wks to 30 min  - cont wtih avoiding repetitive and static THUMB IP and MC flexion with cellphone -  pt can benefit from OT services to decrease edema/pain, increase  AROM and strength in thumb CMC  for use in ADL's and IADL's    OT  Occupational Profile and History Problem Focused Assessment - Including review of records relating to presenting problem    Occupational performance deficits (Please refer to evaluation for details): ADL's;IADL's;Play    Body Structure / Function / Physical Skills ADL;Strength;Pain;IADL;FMC;UE functional use    Rehab Potential Good    Clinical Decision Making Limited treatment options, no task modification necessary    Comorbidities Affecting Occupational Performance: May have comorbidities impacting occupational performance    Modification or Assistance to Complete Evaluation  No modification of tasks or assist necessary to complete eval    OT Frequency Biweekly    OT Duration 6 weeks    OT Treatment/Interventions Self-care/ADL training;Paraffin;Manual Therapy;Patient/family education;Therapeutic exercise;Splinting;DME and/or AE instruction;Fluidtherapy;Contrast Bath;Scar mobilization    Consulted and Agree with Plan of Care Patient             Patient will benefit from skilled therapeutic intervention in order to improve the following deficits and impairments:   Body Structure / Function /  Physical Skills: ADL, Strength, Pain, IADL, [place], UE functional use       Visit Diagnosis: Pain in left hand  Localized edema  Trigger finger, left little finger  Stiffness of left hand, not elsewhere classified  Muscle weakness (generalized)    Problem List Patient Active Problem List   Diagnosis Date Noted   Dysphagia 10/13/2017   Enthesopathy of ankle and tarsus 08/22/2015   Capsulitis 08/22/2015   Diabetes ([place]) 03/10/2015   Arthritis of shoulder region, degenerative 12/01/2014   Coronary artery disease 09/23/2013   HTN (hypertension) 09/12/2013   Hyperlipidemia, unspecified 09/12/2013   Migraines 09/12/2013   Sleep apnea 09/12/2013    Rosalyn Gess OTR/L,CLT 10/12/2020, 4:36 PM  [place] [facility] [address] [place], [address] Phone: [phone]   Fax:  [phone]  Name: Xavier Hebert MRN: [id number] Date of Birth: 1945/07/04

## 2020-10-13 ENCOUNTER — Encounter: Payer: Medicare Other | Admitting: Occupational Therapy

## 2020-10-26 ENCOUNTER — Ambulatory Visit: Payer: Medicare Other | Attending: Orthopedic Surgery | Admitting: Occupational Therapy

## 2020-10-26 DIAGNOSIS — R600 Localized edema: Secondary | ICD-10-CM | POA: Insufficient documentation

## 2020-10-26 DIAGNOSIS — M25642 Stiffness of left hand, not elsewhere classified: Secondary | ICD-10-CM

## 2020-10-26 DIAGNOSIS — M65352 Trigger finger, left little finger: Secondary | ICD-10-CM | POA: Diagnosis present

## 2020-10-26 DIAGNOSIS — M79642 Pain in left hand: Secondary | ICD-10-CM | POA: Insufficient documentation

## 2020-10-26 DIAGNOSIS — M6281 Muscle weakness (generalized): Secondary | ICD-10-CM | POA: Diagnosis present

## 2020-10-26 NOTE — Therapy (Signed)
[place] [facility] [address] [place], [place], [address] Phone: [phone]   Fax:  [phone]  Occupational Therapy Treatment  Patient Details  Name: Xavier Hebert MRN: [id number] Date of Birth: 1946-02-13 Referring Provider (OT): Dr Rudene Christians   Encounter Date: 10/26/2020   OT End of Session - 10/26/20 0824     Visit Number 9    Number of Visits 16    Date for OT Re-Evaluation 11/23/20    OT Start Time 0830    OT Stop Time 0900    OT Time Calculation (min) 30 min    Activity Tolerance Patient tolerated treatment well    Behavior During Therapy [facility] for tasks assessed/performed             Past Medical History:  Diagnosis Date   Anxiety    Arthritis    Chronic pain syndrome    CKD (chronic kidney disease) stage 3, GFR 30-59 ml/min (HCC)    Depression    Diabetes mellitus without complication ([place])    Type II   GERD (gastroesophageal reflux disease)    Headache    Migraine- headache   Hyperlipidemia    Hypertension    Kidney stones    Peptic ulcer    Sleep apnea    does not use cpap/ pt states he lost 60#    Past Surgical History:  Procedure Laterality Date   ANTERIOR CERVICAL DECOMP/DISCECTOMY FUSION  2009   CARPAL TUNNEL RELEASE Left 01/03/2018   Procedure: CARPAL TUNNEL RELEASE;  Surgeon: Hessie Knows, MD;  Location: [facility];  Service: Orthopedics;  Laterality: Left;   CARPOMETACARPAL ([place]) FUSION OF THUMB Left 08/10/2020   Procedure: CARPOMETACARPAL [facility]) FUSION OF THUMB;  Surgeon: Hessie Knows, MD;  Location: [facility];  Service: Orthopedics;  Laterality: Left;   COLONOSCOPY WITH PROPOFOL N/A 11/21/2017   Procedure: COLONOSCOPY WITH PROPOFOL;  Surgeon: Toledo, Benay Pike, MD;  Location: [facility] ENDOSCOPY;  Service: Gastroenterology;  Laterality: N/A;   CYSTOSCOPY     x3- 2 times for stone removal    ESOPHAGOGASTRODUODENOSCOPY N/A 10/14/2017   Procedure: ESOPHAGOGASTRODUODENOSCOPY (EGD);  Surgeon:  Lin Landsman, MD;  Location: [facility] ENDOSCOPY;  Service: Gastroenterology;  Laterality: N/A;   ESOPHAGOGASTRODUODENOSCOPY (EGD) WITH PROPOFOL N/A 10/25/2017   Procedure: ESOPHAGOGASTRODUODENOSCOPY (EGD) WITH PROPOFOL;  Surgeon: Lin Landsman, MD;  Location: [facility] ENDOSCOPY;  Service: Gastroenterology;  Laterality: N/A;   JOINT REPLACEMENT     bil. knees , shoulder rt, and neck    REVERSE SHOULDER ARTHROPLASTY Right 12/01/2014   REVERSE SHOULDER ARTHROPLASTY Right 12/01/2014   Procedure: REVERSE SHOULDER ARTHROPLASTY;  Surgeon: Meredith Pel, MD;  Location: [place];  Service: Orthopedics;  Laterality: Right;   SHOULDER ARTHROSCOPY W/ ROTATOR CUFF REPAIR Right 06/10/2014   "failed"   SHOULDER OPEN ROTATOR CUFF REPAIR Left ~ 2008   TOTAL KNEE ARTHROPLASTY Bilateral 2005-2008   "right-left"   TRIGGER FINGER RELEASE Right 2001   TRIGGER FINGER RELEASE Left 08/10/2020   Procedure: RELEASE TRIGGER FINGER/A-1 PULLEY;  Surgeon: Hessie Knows, MD;  Location: [facility] ORS;  Service: Orthopedics;  Laterality: Left;   URETERAL STENT PLACEMENT      There were no vitals filed for this visit.   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 male who presents today status post left thumb CMC arthroplasty and left little finger trigger release by Dr. Hessie Knows on 08/10/2020. He is on chronic pain medication of oxycodone 15 mg every 6 hours and has been given 7.5 mg for breakthrough pain every 6 hours. Patient's severe pain and swelling did improve - but refer to OT/hand therapy - . Still having some pain and discomfort throughout the hand and little finger. No catching triggering locking of the  fifth digit.    Patient Stated Goals Want the pain and use of my L hand  better so I can play my instruments    Currently in Pain? No/denies                [facility] OT Assessment - 10/26/20 0001       Strength   Left Hand Grip (lbs) 50    Left Hand Lateral Pinch 14 lbs    Left Hand 3 Point Pinch 11 lbs            grip and prehension strength in range for his age    Pt had shot on 09/26/20 for triggr thumb  - pt  not  to do a lot of AROM for thumb flexion , opposition and static pinch of thumb with phone Cont to do functional strengthening for prehension    tenderness  improved greatly over scar at trigger 5th digit  And can make tight and composite fist    Pt show increase  CMC stabilization with opposition  to all digits   And strength in thumb PA and RA 4+/5  Pt to use CMC neoprene  as needed with activities that bother him and can use it when play string instrument  and wean gradually out of it over the next 2 wks - play guitar and banjo 10-20 min and then increase gradually if no issues at thumb - pain free and no triggering     gripping of  teal putty 2  x 20 reps - 2 x day - loose thumb do not grip with thumb     rubber band for PA and RA of thumb 10 reps -2 sets 2 x day pain free Pain free Follow up with surgeon and then with OT one more time               OT Education - 10/26/20 0943     Education Details progress and changes to HEP    Person(s) Educated Patient    Methods Explanation;Demonstration;Tactile cues;Verbal cues;Handout    Comprehension Verbal cues required;Returned demonstration;Verbalized understanding              OT Short Term Goals - 08/19/20 1731       OT SHORT TERM GOAL #1   Title Pt to be independent in HEP to decrease edema, pain for pt to wean out of splint and do HEP for AROM without increase symptoms    Baseline pain 5-10/10 in thumb and 5th digiit - increase edema in 5th and MC -about 1 cm - and decrease AROM MC's  60-70 L hand    Time 4    Period Weeks    Status New    Target Date 09/16/20  OT Long Term Goals - 08/19/20 1732       OT LONG TERM GOAL #1   Title AROM in digits increase for pt to touch palm without increase symptoms    Baseline MC's 60-70 - edema increase 1 cm over 5th PIP and MC's - pain 5-10/10    Time 4    Period Weeks    Status New    Target Date 09/16/20      OT LONG TERM GOAL #2   Title L thumb AROM PA and RA improve to [facility] and opposition to base of 5th to use in pick up light glass and retrieve 2 cm objects out of palm    Baseline NT - 10 days out of surgery in thumb spica    Time 6    Target Date 09/30/20      OT LONG TERM GOAL #3   Title L wrist AROM and strength increase to WNL to push door open and turn doorkonbs    Baseline NT- in thumb spica - 10 days s/p    Time 6    Period Weeks    Status New    Target Date 09/30/20      OT LONG TERM GOAL #4   Title L grip and prehnesion strength increase to more than 70% compare to R hand to initiate playing instruments without increase symptoms    Baseline 10 days s/p- in thumb spica    Time 10    Period Weeks    Target Date 10/28/20                   Plan - 10/26/20 0824     Clinical Impression Statement Pt present at OT11 1/2 wks  s/p L thumb CMC arthroplasty and 5th digit trigger finger release -pain much better and wearing CMC soft neoprene about 50% of time - strenghtin thumb 4+/5 in RA and PA -and grip and prehension strength in range for his age - pt to cont with functional strengthening  because of trigger thumb few wks ago  -  no pain -cont with strengthening for thumb PA and RA -  and putty for gripping but not prehension and can start playing  3  day for 10-20 min  his guitar and increase gradually  - cont wtih avoiding repetitive and static THUMB IP and MC flexion with cellphone - follow up with surgeon in 2-3 wks and follow up one more time with me after that - possible discharge  at that time.    OT Occupational Profile and History Problem Focused Assessment - Including review of records relating to presenting problem    Occupational performance deficits (Please refer to evaluation for details): ADL's;IADL's;Play    Body Structure / Function / Physical Skills ADL;Strength;Pain;IADL;FMC;UE functional use    Rehab Potential Good    Clinical Decision Making Limited treatment options, no task modification necessary    Comorbidities Affecting Occupational Performance: May have comorbidities impacting occupational performance    Modification or Assistance to Complete Evaluation  No modification of tasks or assist necessary to complete eval    OT Frequency Biweekly    OT Duration 6 weeks    OT Treatment/Interventions Self-care/ADL training;Paraffin;Manual Therapy;Patient/family education;Therapeutic exercise;Splinting;DME and/or AE instruction;Fluidtherapy;Contrast Bath;Scar mobilization    Consulted and Agree with Plan of Care Patient             Patient will benefit from skilled therapeutic intervention in order to improve the following deficits and impairments:  Body Structure / Function / Physical Skills: ADL, Strength, Pain, IADL, FMC, UE functional use       Visit Diagnosis: Pain in left hand  Localized edema  Trigger finger, left little finger  Stiffness of left hand, not elsewhere classified  Muscle weakness (generalized)    Problem List Patient Active Problem List   Diagnosis Date Noted   Dysphagia 10/13/2017   Enthesopathy of ankle and tarsus 08/22/2015   Capsulitis 08/22/2015   Diabetes ([place]) 03/10/2015   Arthritis of shoulder region, degenerative 12/01/2014   Coronary artery disease 09/23/2013   HTN (hypertension) 09/12/2013   Hyperlipidemia, unspecified 09/12/2013   Migraines 09/12/2013   Sleep apnea 09/12/2013    Rosalyn Gess OTR/L,CLT 10/26/2020, 9:47 AM  [place] [facility] [address] [place], [address] Phone: [phone]   Fax:  [phone]  Name: Xavier Hebert MRN: [id number] Date of Birth: 1945/07/02

## 2020-11-09 ENCOUNTER — Ambulatory Visit (INDEPENDENT_AMBULATORY_CARE_PROVIDER_SITE_OTHER): Payer: Medicare Other | Admitting: Podiatry

## 2020-11-09 ENCOUNTER — Other Ambulatory Visit: Payer: Self-pay

## 2020-11-09 DIAGNOSIS — M79675 Pain in left toe(s): Secondary | ICD-10-CM | POA: Diagnosis not present

## 2020-11-09 DIAGNOSIS — B351 Tinea unguium: Secondary | ICD-10-CM | POA: Diagnosis not present

## 2020-11-09 DIAGNOSIS — M79674 Pain in right toe(s): Secondary | ICD-10-CM | POA: Diagnosis not present

## 2020-11-09 NOTE — Progress Notes (Signed)
   SUBJECTIVE Patient presents to office today complaining of elongated, thickened nails that cause pain while ambulating in shoes.  Patient is unable to trim their own nails. Patient is here for further evaluation and treatment.  Past Medical History:  Diagnosis Date   Anxiety    Arthritis    Chronic pain syndrome    CKD (chronic kidney disease) stage 3, GFR 30-59 ml/min (HCC)    Depression    Diabetes mellitus without complication (HCC)    Type II   GERD (gastroesophageal reflux disease)    Headache    Migraine- headache   Hyperlipidemia    Hypertension    Kidney stones    Peptic ulcer    Sleep apnea    does not use cpap/ pt states he lost 60#    OBJECTIVE General Patient is awake, alert, and oriented x 3 and in no acute distress. Derm Skin is dry and supple bilateral. Negative open lesions or macerations. Remaining integument unremarkable. Nails are tender, long, thickened and dystrophic with subungual debris, consistent with onychomycosis, 1-5 bilateral. No signs of infection noted. Vasc  DP and PT pedal pulses palpable bilaterally. Temperature gradient within normal limits.  Neuro Epicritic and protective threshold sensation grossly intact bilaterally.  Musculoskeletal Exam No symptomatic pedal deformities noted bilateral. Muscular strength within normal limits.  ASSESSMENT 1.  Pain due to onychomycosis of toenails both  PLAN OF CARE 1. Patient evaluated today.  2. Instructed to maintain good pedal hygiene and foot care.  3. Mechanical debridement of nails 1-5 bilaterally performed using a nail nipper. Filed with dremel without incident.  4. Return to clinic in 3 mos.    Edrick Kins, DPM [facility]  Dr. Edrick Kins, DPM    [address], [place] [address]                Office [phone]  Fax [phone]

## 2020-11-17 ENCOUNTER — Ambulatory Visit: Payer: Medicare Other | Admitting: Occupational Therapy

## 2020-11-23 ENCOUNTER — Ambulatory Visit: Payer: Medicare Other | Attending: Orthopedic Surgery | Admitting: Occupational Therapy

## 2020-11-23 DIAGNOSIS — R600 Localized edema: Secondary | ICD-10-CM | POA: Insufficient documentation

## 2020-11-23 DIAGNOSIS — M79642 Pain in left hand: Secondary | ICD-10-CM | POA: Insufficient documentation

## 2020-11-23 DIAGNOSIS — M65352 Trigger finger, left little finger: Secondary | ICD-10-CM | POA: Insufficient documentation

## 2020-11-23 DIAGNOSIS — M25642 Stiffness of left hand, not elsewhere classified: Secondary | ICD-10-CM | POA: Insufficient documentation

## 2020-11-23 DIAGNOSIS — M6281 Muscle weakness (generalized): Secondary | ICD-10-CM | POA: Insufficient documentation

## 2020-12-01 ENCOUNTER — Ambulatory Visit: Payer: Medicare Other | Admitting: Occupational Therapy

## 2020-12-01 DIAGNOSIS — M25642 Stiffness of left hand, not elsewhere classified: Secondary | ICD-10-CM

## 2020-12-01 DIAGNOSIS — M65352 Trigger finger, left little finger: Secondary | ICD-10-CM | POA: Diagnosis present

## 2020-12-01 DIAGNOSIS — R600 Localized edema: Secondary | ICD-10-CM | POA: Diagnosis present

## 2020-12-01 DIAGNOSIS — M79642 Pain in left hand: Secondary | ICD-10-CM

## 2020-12-01 DIAGNOSIS — M6281 Muscle weakness (generalized): Secondary | ICD-10-CM | POA: Diagnosis present

## 2020-12-01 NOTE — Therapy (Signed)
[facility] [address], [place], [address] Phone: [phone]   Fax:  [phone]  Occupational Therapy Treatment/discharge  Patient Details  Name: Xavier Hebert MRN: [id number] Date of Birth: 1945/11/17 Referring Provider (OT): Dr Rudene Christians   Encounter Date: 12/01/2020   OT End of Session - 12/01/20 1815     Visit Number 10    Number of Visits 11    Date for OT Re-Evaluation 12/01/20    OT Start Time 1505    OT Stop Time 1540    OT Time Calculation (min) 35 min    Activity Tolerance Patient tolerated treatment well    Behavior During Therapy [facility] for tasks assessed/performed             Past Medical History:  Diagnosis Date   Anxiety    Arthritis    Chronic pain syndrome    CKD (chronic kidney disease) stage 3, GFR 30-59 ml/min ([place])    Depression    Diabetes mellitus without complication ([place])    Type II   GERD (gastroesophageal reflux disease)    Headache    Migraine- headache   Hyperlipidemia    Hypertension    Kidney stones    Peptic ulcer    Sleep apnea    does not use cpap/ pt states he lost 60#    Past Surgical History:  Procedure Laterality Date   ANTERIOR CERVICAL DECOMP/DISCECTOMY FUSION  2009   CARPAL TUNNEL RELEASE Left 01/03/2018   Procedure: CARPAL TUNNEL RELEASE;  Surgeon: Hessie Knows, MD;  Location: [facility] ORS;  Service: Orthopedics;  Laterality: Left;   CARPOMETACARPAL ([place]) FUSION OF THUMB Left 08/10/2020   Procedure: CARPOMETACARPAL [facility]) FUSION OF THUMB;  Surgeon: Hessie Knows, MD;  Location: [facility] ORS;  Service: Orthopedics;  Laterality: Left;   COLONOSCOPY WITH PROPOFOL N/A 11/21/2017   Procedure: COLONOSCOPY WITH PROPOFOL;  Surgeon: Tol[place], Benay Pike, MD;  Location: [facility] ENDOSCOPY;  Service: Gastroenterology;  Laterality: N/A;   CYSTOSCOPY     x3- 2 times for stone removal    ESOPHAGOGASTRODUODENOSCOPY N/A 10/14/2017   Procedure: ESOPHAGOGASTRODUODENOSCOPY (EGD);   Surgeon: Lin Landsman, MD;  Location: [facility] ENDOSCOPY;  Service: Gastroenterology;  Laterality: N/A;   ESOPHAGOGASTRODUODENOSCOPY (EGD) WITH PROPOFOL N/A 10/25/2017   Procedure: ESOPHAGOGASTRODUODENOSCOPY (EGD) WITH PROPOFOL;  Surgeon: Lin Landsman, MD;  Location: [facility] ENDOSCOPY;  Service: Gastroenterology;  Laterality: N/A;   JOINT REPLACEMENT     bil. knees , shoulder rt, and neck    REVERSE SHOULDER ARTHROPLASTY Right 12/01/2014   REVERSE SHOULDER ARTHROPLASTY Right 12/01/2014   Procedure: REVERSE SHOULDER ARTHROPLASTY;  Surgeon: Meredith Pel, MD;  Location: [place];  Service: Orthopedics;  Laterality: Right;   SHOULDER ARTHROSCOPY W/ ROTATOR CUFF REPAIR Right 06/10/2014   "failed"   SHOULDER OPEN ROTATOR CUFF REPAIR Left ~ 2008   TOTAL KNEE ARTHROPLASTY Bilateral 2005-2008   "right-left"   TRIGGER FINGER RELEASE Right 2001   TRIGGER FINGER RELEASE Left 08/10/2020   Procedure: RELEASE TRIGGER FINGER/A-1 PULLEY;  Surgeon: Hessie Knows, MD;  Location: [facility] ORS;  Service: Orthopedics;  Laterality: Left;   URETERAL STENT PLACEMENT      There were no vitals filed for this visit.   Subjective Assessment - 12/01/20 1813     Subjective  My wrist hurts - all around my wrist and my thumb weak - but I am playing with the guys guitar and banjo - about 3 hrs at time -and here and there -  thumb feels just weak    Pertinent History Xavier Hebert is a 75 y.o. male who presents today status post left thumb CMC arthroplasty and left little finger trigger release by Dr. Hessie Knows on 08/10/2020. He is on chronic pain medication of oxycodone 15 mg every 6 hours and has been given 7.5 mg for breakthrough pain every 6 hours. Patient's severe pain and swelling did improve - but refer to OT/hand therapy - . Still having some pain and discomfort throughout the hand and little finger. No catching triggering locking of the fifth digit.    Patient Stated Goals Want the pain and use of my L hand   better so I can play my instruments    Currently in Pain? Yes    Pain Score 5     Pain Location Wrist    Pain Orientation Left    Pain Descriptors / Indicators Aching    Pain Type Acute pain    Pain Onset 1 to 4 weeks ago    Pain Frequency Intermittent                [facility] OT Assessment - 12/01/20 0001       Strength   Right Hand Grip (lbs) 70    Right Hand Lateral Pinch 20 lbs    Right Hand 3 Point Pinch 18 lbs    Left Hand Grip (lbs) 51    Left Hand Lateral Pinch 14 lbs    Left Hand 3 Point Pinch 11 lbs             Pt grip and prehension same as month ago - no pain in thumb and no triggering or tenderness over A1pulley  But report wrist is hurting  Pt is playing guitar and banjo with friends sometimes up to 3hrs- remind pt reason for surgery was pain control  And pt need to increase time playing gradually - And dont want over flexible thumb- Thumb AROM WFL   Pt show increase  CMC stabilization with opposition  to all digits   And strength in thumb PA and RA 4+/5  Pt to use CMC neoprene  as needed with activities that bother him    Cont with rubber band for PA and RA of thumb 12 reps -2 sets 2 x day pain free Pain free                OT Education - 12/01/20 1815     Education Details discharge instructions and HEP    Person(s) Educated Patient;Spouse    Methods Explanation;Demonstration;Tactile cues;Verbal cues;Handout    Comprehension Verbal cues required;Returned demonstration;Verbalized understanding              OT Short Term Goals - 10/26/20 0950       OT SHORT TERM GOAL #1   Title Pt to be independent in HEP to decrease edema, pain for pt to wean out of splint and do HEP for AROM without increase symptoms    Status Achieved               OT Long Term Goals - 12/01/20 1821       OT LONG TERM GOAL #4   Title L grip and prehnesion strength increase to more than 70% compare to R hand to initiate playing instruments without  increase symptoms    Baseline grip 50 lbs , and lat grip in range for his age- did not play guitar because of trigger thumb issues - no  triggering - playing - no pain in thumb    Status Achieved                   Plan - 12/01/20 1816     Clinical Impression Statement Pt present at OT 4 months  s/p L thumb CMC arthroplasty and 5th digit trigger finger release - was not seen for about month - Pt was doing well month ago - return this date with complains of wrist pain and thumb feels weak. Pt thumb AROM WFL and strength 4+/5 , grip and prehension in range for his age. REINFORCE again with pt that reason for surgery was pain control and he did not play his string instruments for 6-8 wks -and cannot play for 3 hrs at time and several times week for longer periods - that will cause his wrist pain - pt to gradually increase time. Also reminded him he had trigger thumb issues -and recommend more functional strengthening than repetitive pinching of putty. Do want him to cont with rubberband HEP for thumb PA and RA . Thumb still able to stabilize during pinching and opposition. Pain not in thumb.  To gradually increase time playing his string instruments. Pt discharge at this time.    OT Occupational Profile and History Problem Focused Assessment - Including review of records relating to presenting problem    Occupational performance deficits (Please refer to evaluation for details): ADL's;IADL's;Play    Body Structure / Function / Physical Skills ADL;Strength;Pain;IADL;FMC;UE functional use    Rehab Potential Good    Clinical Decision Making Limited treatment options, no task modification necessary    Comorbidities Affecting Occupational Performance: May have comorbidities impacting occupational performance    Modification or Assistance to Complete Evaluation  No modification of tasks or assist necessary to complete eval    OT Frequency 1x / week    OT Duration --   1 wk   OT  Treatment/Interventions Self-care/ADL training;Paraffin;Manual Therapy;Patient/family education;Therapeutic exercise;Splinting;DME and/or AE instruction;Fluidtherapy;Contrast Bath;Scar mobilization    Consulted and Agree with Plan of Care Patient             Patient will benefit from skilled therapeutic intervention in order to improve the following deficits and impairments:   Body Structure / Function / Physical Skills: ADL, Strength, Pain, IADL, FMC, UE functional use       Visit Diagnosis: Pain in left hand  Trigger finger, left little finger  Localized edema  Stiffness of left hand, not elsewhere classified  Muscle weakness (generalized)    Problem List Patient Active Problem List   Diagnosis Date Noted   Dysphagia 10/13/2017   Enthesopathy of ankle and tarsus 08/22/2015   Capsulitis 08/22/2015   Diabetes ([place]) 03/10/2015   Arthritis of shoulder region, degenerative 12/01/2014   Coronary artery disease 09/23/2013   HTN (hypertension) 09/12/2013   Hyperlipidemia, unspecified 09/12/2013   Migraines 09/12/2013   Sleep apnea 09/12/2013    Rosalyn Gess, OTR/L,CLT 12/01/2020, 6:32 PM  [place] [address] [place], [address] Phone: [phone]   Fax:  [phone]  Name: Xavier Hebert MRN: [id number] Date of Birth: 1946/02/29

## 2021-01-12 ENCOUNTER — Other Ambulatory Visit (HOSPITAL_COMMUNITY): Payer: Self-pay | Admitting: Urology

## 2021-01-12 ENCOUNTER — Other Ambulatory Visit: Payer: Self-pay | Admitting: Urology

## 2021-01-12 DIAGNOSIS — R109 Unspecified abdominal pain: Secondary | ICD-10-CM

## 2021-01-12 DIAGNOSIS — R110 Nausea: Secondary | ICD-10-CM

## 2021-01-14 ENCOUNTER — Other Ambulatory Visit: Payer: Self-pay

## 2021-01-14 ENCOUNTER — Ambulatory Visit
Admission: RE | Admit: 2021-01-14 | Discharge: 2021-01-14 | Disposition: A | Discharge status: Home / Self Care | Payer: Medicare Other | Source: Ambulatory Visit | Attending: Urology | Admitting: Urology

## 2021-01-14 DIAGNOSIS — R109 Unspecified abdominal pain: Secondary | ICD-10-CM | POA: Diagnosis present

## 2021-01-14 DIAGNOSIS — R110 Nausea: Secondary | ICD-10-CM | POA: Insufficient documentation

## 2021-02-11 ENCOUNTER — Ambulatory Visit: Payer: Medicare Other | Admitting: Podiatry

## 2021-02-22 ENCOUNTER — Other Ambulatory Visit: Payer: Self-pay

## 2021-02-22 ENCOUNTER — Ambulatory Visit (INDEPENDENT_AMBULATORY_CARE_PROVIDER_SITE_OTHER): Payer: Medicare Other | Admitting: Podiatry

## 2021-02-22 DIAGNOSIS — M79675 Pain in left toe(s): Secondary | ICD-10-CM | POA: Diagnosis not present

## 2021-02-22 DIAGNOSIS — B351 Tinea unguium: Secondary | ICD-10-CM

## 2021-02-22 DIAGNOSIS — M79674 Pain in right toe(s): Secondary | ICD-10-CM | POA: Diagnosis not present

## 2021-02-22 NOTE — Progress Notes (Signed)
   SUBJECTIVE Patient presents to office today complaining of elongated, thickened nails that cause pain while ambulating in shoes.  Patient is unable to trim their own nails. Patient is here for further evaluation and treatment.  Past Medical History:  Diagnosis Date   Anxiety    Arthritis    Chronic pain syndrome    CKD (chronic kidney disease) stage 3, GFR 30-59 ml/min (HCC)    Depression    Diabetes mellitus without complication (HCC)    Type II   GERD (gastroesophageal reflux disease)    Headache    Migraine- headache   Hyperlipidemia    Hypertension    Kidney stones    Peptic ulcer    Sleep apnea    does not use cpap/ pt states he lost 60#    OBJECTIVE General Patient is awake, alert, and oriented x 3 and in no acute distress. Derm Skin is dry and supple bilateral. Negative open lesions or macerations. Remaining integument unremarkable. Nails are tender, long, thickened and dystrophic with subungual debris, consistent with onychomycosis, 1-5 bilateral. No signs of infection noted. Vasc  DP and PT pedal pulses palpable bilaterally. Temperature gradient within normal limits.  Neuro Epicritic and protective threshold sensation grossly intact bilaterally.  Musculoskeletal Exam No symptomatic pedal deformities noted bilateral. Muscular strength within normal limits.  ASSESSMENT 1.  Pain due to onychomycosis of toenails both  PLAN OF CARE 1. Patient evaluated today.  2. Instructed to maintain good pedal hygiene and foot care.  3. Mechanical debridement of nails 1-5 bilaterally performed using a nail nipper. Filed with dremel without incident.  4. Return to clinic in 3 mos.    Edrick Kins, DPM [facility]  Dr. Edrick Kins, DPM    [address], [place] [address]                Office [phone]  Fax [phone]

## 2021-05-24 ENCOUNTER — Encounter: Payer: Self-pay | Admitting: Podiatry

## 2021-05-24 ENCOUNTER — Other Ambulatory Visit: Payer: Self-pay

## 2021-05-24 ENCOUNTER — Ambulatory Visit (INDEPENDENT_AMBULATORY_CARE_PROVIDER_SITE_OTHER): Payer: Medicare Other | Admitting: Podiatry

## 2021-05-24 DIAGNOSIS — M79675 Pain in left toe(s): Secondary | ICD-10-CM

## 2021-05-24 DIAGNOSIS — M79674 Pain in right toe(s): Secondary | ICD-10-CM | POA: Diagnosis not present

## 2021-05-24 DIAGNOSIS — B351 Tinea unguium: Secondary | ICD-10-CM

## 2021-05-24 NOTE — Progress Notes (Signed)
? ?  SUBJECTIVE ?Patient presents to office today complaining of elongated, thickened nails that cause pain while ambulating in shoes.  Patient is unable to trim their own nails. Patient is here for further evaluation and treatment. ? ?Past Medical History:  ?Diagnosis Date  ? Anxiety   ? Arthritis   ? Chronic pain syndrome   ? CKD (chronic kidney disease) stage 3, GFR 30-59 ml/min ([facility])   ? Depression   ? Diabetes mellitus without complication ([place])   ? Type II  ? GERD (gastroesophageal reflux disease)   ? Headache   ? Migraine- headache  ? Hyperlipidemia   ? Hypertension   ? Kidney stones   ? Peptic ulcer   ? Sleep apnea   ? does not use cpap/ pt states he lost 60#  ? ? ?OBJECTIVE ?General Patient is awake, alert, and oriented x 3 and in no acute distress. ?Derm Skin is dry and supple bilateral. Negative open lesions or macerations. Remaining integument unremarkable. Nails are tender, long, thickened and dystrophic with subungual debris, consistent with onychomycosis, 1-5 bilateral. No signs of infection noted. ?Vasc  DP and PT pedal pulses palpable bilaterally. Temperature gradient within normal limits.  ?Neuro Epicritic and protective threshold sensation grossly intact bilaterally.  ?Musculoskeletal Exam No symptomatic pedal deformities noted bilateral. Muscular strength within normal limits. ? ?ASSESSMENT ?1.  Pain due to onychomycosis of toenails both ? ?PLAN OF CARE ?1. Patient evaluated today.  ?2. Instructed to maintain good pedal hygiene and foot care.  ?3. Mechanical debridement of nails 1-5 bilaterally performed using a nail nipper. Filed with dremel without incident.  ?4. Return to clinic in 3 mos.  ? ? ?Edrick Kins, DPM ?[place] ? ?Dr. Edrick Kins, DPM  ?  ?[address]                                     ?[place], [place] [address]                ?Office [phone]  ?Fax [phone] ? ? ? ? ?

## 2021-08-26 ENCOUNTER — Ambulatory Visit (INDEPENDENT_AMBULATORY_CARE_PROVIDER_SITE_OTHER): Payer: Medicare Other | Admitting: Podiatry

## 2021-08-26 ENCOUNTER — Encounter: Payer: Self-pay | Admitting: Podiatry

## 2021-08-26 DIAGNOSIS — M79674 Pain in right toe(s): Secondary | ICD-10-CM

## 2021-08-26 DIAGNOSIS — M898X7 Other specified disorders of bone, ankle and foot: Secondary | ICD-10-CM | POA: Diagnosis not present

## 2021-08-26 DIAGNOSIS — B351 Tinea unguium: Secondary | ICD-10-CM | POA: Diagnosis not present

## 2021-08-26 DIAGNOSIS — M79675 Pain in left toe(s): Secondary | ICD-10-CM

## 2021-08-26 NOTE — Progress Notes (Signed)
   SUBJECTIVE Patient PMHx diabetes mellitus type 2 presents to office today complaining of elongated, thickened nails that cause pain while ambulating in shoes.  Patient is unable to trim their own nails.   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  Patient is always noticed a large lump over the dorsum of the left foot and most recently over the last few months it has become increasingly symptomatic and he believes it is getting larger in size.  He has not done anything for treatment other than wear shoes that do not irritate the area.  Patient is here for further evaluation and treatment.  Past Medical History:  Diagnosis Date   Anxiety    Arthritis    Chronic pain syndrome    CKD (chronic kidney disease) stage 3, GFR 30-59 ml/min ([facility])    Depression    Diabetes mellitus without complication ([facility])    Type II   GERD (gastroesophageal reflux disease)    Headache    Migraine- headache   Hyperlipidemia    Hypertension    Kidney stones    Peptic ulcer    Sleep apnea    does not use cpap/ pt states he lost 60#    OBJECTIVE General Patient is awake, alert, and oriented x 3 and in no acute distress. Derm Skin is dry and supple bilateral. Negative open lesions or macerations. Remaining integument unremarkable. Nails are tender, long, thickened and dystrophic with subungual debris, consistent with onychomycosis, 1-5 bilateral. No signs of infection noted. Vasc  DP and PT pedal pulses palpable bilaterally. Temperature gradient within normal limits.  Neuro Epicritic and protective threshold sensation grossly intact bilaterally.  Musculoskeletal Exam there is a symptomatic dorsal exostosis overlying the first TMT joint of the left foot.  Possibly overlying ganglion cyst as well.  Overall the lesion appears very hard and nodular but there is some superficial fluctuance overlying.  Findings are consistent  with an underlying exostosis at the TMT with possibly concomitant ganglion cyst as well  ASSESSMENT 1.  Pain due to onychomycosis of toenails both 2.  Dorsal exostosis first TMT left  PLAN OF CARE 1. Patient evaluated today.  2. Instructed to maintain good pedal hygiene and foot care.  3. Mechanical debridement of nails 1-5 bilaterally performed using a nail nipper. Filed with dremel without incident.  4.  Today we discussed the possibility of surgically excising the dorsal exostosis to the left foot.  The patient states that he is currently dealing with cataracts and glaucoma's and is expecting to have eye surgery soon.  We will hold off on management of the left foot for the moment.   5.  Return to clinic in 3 mos. for routine foot care, follow-up x-ray, and to possibly address the large exostosis of the left foot   Edrick Kins, DPM [facility]  Dr. Edrick Kins, DPM    [address], [place] [address]                Office [phone]  Fax [phone]

## 2021-11-16 ENCOUNTER — Other Ambulatory Visit: Payer: Self-pay | Admitting: Podiatry

## 2021-11-16 NOTE — Telephone Encounter (Signed)
Please advise 

## 2021-11-25 ENCOUNTER — Ambulatory Visit: Payer: Medicare Other | Admitting: Podiatry

## 2021-12-13 ENCOUNTER — Ambulatory Visit (INDEPENDENT_AMBULATORY_CARE_PROVIDER_SITE_OTHER): Payer: Medicare Other | Admitting: Podiatry

## 2021-12-13 DIAGNOSIS — M79674 Pain in right toe(s): Secondary | ICD-10-CM

## 2021-12-13 DIAGNOSIS — E119 Type 2 diabetes mellitus without complications: Secondary | ICD-10-CM

## 2021-12-13 DIAGNOSIS — B351 Tinea unguium: Secondary | ICD-10-CM | POA: Diagnosis not present

## 2021-12-13 DIAGNOSIS — M79675 Pain in left toe(s): Secondary | ICD-10-CM | POA: Diagnosis not present

## 2021-12-13 NOTE — Progress Notes (Signed)
   Chief Complaint  Patient presents with   foot care    Patient is here for diabetic foot care.    SUBJECTIVE Patient presents to office today complaining of elongated, thickened nails that cause pain while ambulating in shoes.  Patient is unable to trim their own nails. Patient is here for further evaluation and treatment.  Past Medical History:  Diagnosis Date   Anxiety    Arthritis    Chronic pain syndrome    CKD (chronic kidney disease) stage 3, GFR 30-59 ml/min ([facility])    Depression    Diabetes mellitus without complication ([facility])    Type II   GERD (gastroesophageal reflux disease)    Headache    Migraine- headache   Hyperlipidemia    Hypertension    Kidney stones    Peptic ulcer    Sleep apnea    does not use cpap/ pt states he lost 60#    OBJECTIVE General Patient is awake, alert, and oriented x 3 and in no acute distress. Derm Skin is dry and supple bilateral. Negative open lesions or macerations. Remaining integument unremarkable. Nails are tender, long, thickened and dystrophic with subungual debris, consistent with onychomycosis, 1-5 bilateral. No signs of infection noted. Vasc  DP and PT pedal pulses palpable bilaterally. Temperature gradient within normal limits.  Neuro Epicritic and protective threshold sensation grossly intact bilaterally.  Musculoskeletal Exam No symptomatic pedal deformities noted bilateral. Muscular strength within normal limits.  ASSESSMENT 1.  Pain due to onychomycosis of toenails both  PLAN OF CARE 1. Patient evaluated today. Comprehensive diabetic foot exam performed today. 2. Instructed to maintain good pedal hygiene and foot care.  3. Mechanical debridement of nails 1-5 bilaterally performed using a nail nipper. Filed with dremel without incident.  4. Return to clinic in 3 mos.    Edrick Kins, DPM [facility]  Dr. Edrick Kins, DPM    [address], [place]  [address]                Office [phone]  Fax [phone]

## 2022-01-21 DIAGNOSIS — H401111 Primary open-angle glaucoma, right eye, mild stage: Secondary | ICD-10-CM | POA: Insufficient documentation

## 2022-01-21 DIAGNOSIS — H2513 Age-related nuclear cataract, bilateral: Secondary | ICD-10-CM | POA: Insufficient documentation

## 2022-01-21 DIAGNOSIS — H401123 Primary open-angle glaucoma, left eye, severe stage: Secondary | ICD-10-CM | POA: Insufficient documentation

## 2022-01-21 HISTORY — DX: Age-related nuclear cataract, bilateral: H25.13

## 2022-03-17 ENCOUNTER — Ambulatory Visit: Payer: Medicare Other | Admitting: Podiatry

## 2022-04-11 ENCOUNTER — Ambulatory Visit (INDEPENDENT_AMBULATORY_CARE_PROVIDER_SITE_OTHER): Payer: Medicare Other | Admitting: Podiatry

## 2022-04-11 DIAGNOSIS — B351 Tinea unguium: Secondary | ICD-10-CM

## 2022-04-11 DIAGNOSIS — M79675 Pain in left toe(s): Secondary | ICD-10-CM

## 2022-04-11 DIAGNOSIS — M79674 Pain in right toe(s): Secondary | ICD-10-CM

## 2022-04-11 NOTE — Progress Notes (Signed)
   Chief Complaint  Patient presents with   Diabetes    Diabetic foot care, nail trim     SUBJECTIVE Patient with a history of diabetes mellitus presents to office today complaining of elongated, thickened nails that cause pain while ambulating in shoes.  Patient is unable to trim their own nails. Patient is here for further evaluation and treatment.  Past Medical History:  Diagnosis Date   Anxiety    Arthritis    Chronic pain syndrome    CKD (chronic kidney disease) stage 3, GFR 30-59 ml/min ([facility])    Depression    Diabetes mellitus without complication ([facility])    Type II   GERD (gastroesophageal reflux disease)    Headache    Migraine- headache   Hyperlipidemia    Hypertension    Kidney stones    Peptic ulcer    Sleep apnea    does not use cpap/ pt states he lost 60#    Allergies  Allergen Reactions   Ivp Dye [Iodinated Contrast Media] Anaphylaxis and Other (See Comments)    Breathing difficulty and chest pain   Amlodipine     Other reaction(s): Edema of lower extremity   Aspirin Other (See Comments)    Stomach pain   Glipizide Other (See Comments)    Other reaction(s): Chest pain   Hydrochlorothiazide Other (See Comments)    Other reaction(s): Impotence   Ibuprofen Other (See Comments)    Stomach pain   Ioxaglate Other (See Comments)    Breathing difficulty and chest pain   Latex Itching   Nsaids     Kidneys   Omeprazole Other (See Comments)    Other reaction(s): FAILED THERAPY   Pregabalin Other (See Comments)    Other reaction(s): Dizziness   Quinine Other (See Comments)    Other reaction(s): SHORTNESS OF BREATH   Doxycycline Anxiety   Pioglitazone Other (See Comments) and Diarrhea    Other reaction(s): Diarrhea, Abdominal pain     OBJECTIVE General Patient is awake, alert, and oriented x 3 and in no acute distress. Derm Skin is dry and supple bilateral. Negative open lesions or macerations. Remaining integument unremarkable. Nails are tender, long,  thickened and dystrophic with subungual debris, consistent with onychomycosis, 1-5 bilateral. No signs of infection noted. Vasc  DP and PT pedal pulses palpable bilaterally. Temperature gradient within normal limits.  Neuro Epicritic and protective threshold sensation diminished bilaterally.  Musculoskeletal Exam No symptomatic pedal deformities noted bilateral. Muscular strength within normal limits.  ASSESSMENT 1. Diabetes Mellitus w/ peripheral neuropathy 2.  Pain due to onychomycosis of toenails bilateral  PLAN OF CARE 1. Patient evaluated today. 2. Instructed to maintain good pedal hygiene and foot care. Stressed importance of controlling blood sugar.  3. Mechanical debridement of nails 1-5 bilaterally performed using a nail nipper. Filed with dremel without incident.  4. Return to clinic in 3 mos.     Edrick Kins, DPM [facility]  Dr. Edrick Kins, DPM    [address], [place] [address]                Office [phone]  Fax [phone]

## 2022-04-17 DIAGNOSIS — H269 Unspecified cataract: Secondary | ICD-10-CM | POA: Insufficient documentation

## 2022-05-10 DIAGNOSIS — Z9841 Cataract extraction status, right eye: Secondary | ICD-10-CM

## 2022-05-10 DIAGNOSIS — Z9842 Cataract extraction status, left eye: Secondary | ICD-10-CM | POA: Insufficient documentation

## 2022-05-10 HISTORY — DX: Cataract extraction status, right eye: Z98.41

## 2022-05-19 ENCOUNTER — Other Ambulatory Visit: Payer: Self-pay

## 2022-05-19 MED ORDER — LIDOCAINE 5 % EX PTCH: 1.0000 | MEDICATED_PATCH | Freq: Two times a day (BID) | CUTANEOUS | 6 refills | Status: AC

## 2022-05-30 ENCOUNTER — Ambulatory Visit: Payer: Medicare Other | Admitting: Podiatry

## 2022-06-26 ENCOUNTER — Ambulatory Visit: Payer: Medicare Other | Admitting: Family

## 2022-07-04 ENCOUNTER — Ambulatory Visit (INDEPENDENT_AMBULATORY_CARE_PROVIDER_SITE_OTHER): Payer: Medicare Other | Admitting: Family

## 2022-07-04 VITALS — BP 124/60 | HR 89 | Ht 72.0 in | Wt 176.8 lb

## 2022-07-04 DIAGNOSIS — E559 Vitamin D deficiency, unspecified: Secondary | ICD-10-CM

## 2022-07-04 DIAGNOSIS — E1165 Type 2 diabetes mellitus with hyperglycemia: Secondary | ICD-10-CM

## 2022-07-04 DIAGNOSIS — E039 Hypothyroidism, unspecified: Secondary | ICD-10-CM

## 2022-07-04 DIAGNOSIS — G43909 Migraine, unspecified, not intractable, without status migrainosus: Secondary | ICD-10-CM | POA: Diagnosis not present

## 2022-07-04 DIAGNOSIS — E782 Mixed hyperlipidemia: Secondary | ICD-10-CM

## 2022-07-04 DIAGNOSIS — I10 Essential (primary) hypertension: Secondary | ICD-10-CM | POA: Diagnosis not present

## 2022-07-04 DIAGNOSIS — E538 Deficiency of other specified B group vitamins: Secondary | ICD-10-CM

## 2022-07-04 MED ORDER — CYANOCOBALAMIN 1000 MCG/ML IJ SOLN
1000.0000 ug | Freq: Once | INTRAMUSCULAR | Status: AC
  Administered 2022-07-04: 1000 ug via INTRAMUSCULAR

## 2022-07-05 LAB — CMP14+EGFR
ALT: 15 IU/L (ref 0–44)
AST: 15 IU/L (ref 0–40)
Albumin/Globulin Ratio: 1.7 (ref 1.2–2.2)
Albumin: 4.5 g/dL (ref 3.8–4.8)
Alkaline Phosphatase: 110 IU/L (ref 44–121)
BUN/Creatinine Ratio: 15 (ref 10–24)
BUN: 29 mg/dL — ABNORMAL HIGH (ref 8–27)
Bilirubin Total: 0.4 mg/dL (ref 0.0–1.2)
CO2: 23 mmol/L (ref 20–29)
Calcium: 9.8 mg/dL (ref 8.6–10.2)
Chloride: 103 mmol/L (ref 96–106)
Creatinine, Ser: 1.97 mg/dL — ABNORMAL HIGH (ref 0.76–1.27)
Globulin, Total: 2.6 g/dL (ref 1.5–4.5)
Glucose: 100 mg/dL — ABNORMAL HIGH (ref 70–99)
Potassium: 4.7 mmol/L (ref 3.5–5.2)
Sodium: 141 mmol/L (ref 134–144)
Total Protein: 7.1 g/dL (ref 6.0–8.5)
eGFR: 35 mL/min/{1.73_m2} — ABNORMAL LOW (ref >59)

## 2022-07-05 LAB — CBC WITH DIFFERENTIAL
Basophils Absolute: 0 10*3/uL (ref 0.0–0.2)
Basos: 1 %
EOS (ABSOLUTE): 0.2 10*3/uL (ref 0.0–0.4)
Eos: 4 %
Hematocrit: 46.6 % (ref 37.5–51.0)
Hemoglobin: 16.3 g/dL (ref 13.0–17.7)
Immature Grans (Abs): 0 10*3/uL (ref 0.0–0.1)
Immature Granulocytes: 1 %
Lymphocytes Absolute: 0.8 10*3/uL (ref 0.7–3.1)
Lymphs: 14 %
MCH: 33.4 pg — ABNORMAL HIGH (ref 26.6–33.0)
MCHC: 35 g/dL (ref 31.5–35.7)
MCV: 96 fL (ref 79–97)
Monocytes Absolute: 0.5 10*3/uL (ref 0.1–0.9)
Monocytes: 9 %
Neutrophils Absolute: 4.1 10*3/uL (ref 1.4–7.0)
Neutrophils: 71 %
RBC: 4.88 x10E6/uL (ref 4.14–5.80)
RDW: 13 % (ref 11.6–15.4)
WBC: 5.7 10*3/uL (ref 3.4–10.8)

## 2022-07-05 LAB — LIPID PANEL
Chol/HDL Ratio: 3.6 ratio (ref 0.0–5.0)
Cholesterol, Total: 176 mg/dL (ref 100–199)
HDL: 49 mg/dL (ref >39)
LDL Chol Calc (NIH): 101 mg/dL — ABNORMAL HIGH (ref 0–99)
Triglycerides: 146 mg/dL (ref 0–149)
VLDL Cholesterol Cal: 26 mg/dL (ref 5–40)

## 2022-07-05 LAB — TSH: TSH: 1.05 u[IU]/mL (ref 0.450–4.500)

## 2022-07-05 LAB — HEMOGLOBIN A1C
Est. average glucose Bld gHb Est-mCnc: 137 mg/dL
Hgb A1c MFr Bld: 6.4 % — ABNORMAL HIGH (ref 4.8–5.6)

## 2022-07-05 LAB — VITAMIN B12: Vitamin B-12: >2000 pg/mL — ABNORMAL HIGH (ref 232–1245)

## 2022-07-05 LAB — VITAMIN D 25 HYDROXY (VIT D DEFICIENCY, FRACTURES): Vit D, 25-Hydroxy: 57.8 ng/mL (ref 30.0–100.0)

## 2022-07-08 ENCOUNTER — Encounter: Payer: Self-pay | Admitting: Family

## 2022-07-08 DIAGNOSIS — F419 Anxiety disorder, unspecified: Secondary | ICD-10-CM | POA: Insufficient documentation

## 2022-07-08 DIAGNOSIS — G4700 Insomnia, unspecified: Secondary | ICD-10-CM | POA: Insufficient documentation

## 2022-07-08 DIAGNOSIS — T84022A Instability of internal right knee prosthesis, initial encounter: Secondary | ICD-10-CM | POA: Insufficient documentation

## 2022-07-08 DIAGNOSIS — I872 Venous insufficiency (chronic) (peripheral): Secondary | ICD-10-CM | POA: Insufficient documentation

## 2022-07-08 DIAGNOSIS — N184 Chronic kidney disease, stage 4 (severe): Secondary | ICD-10-CM | POA: Insufficient documentation

## 2022-07-08 DIAGNOSIS — G8929 Other chronic pain: Secondary | ICD-10-CM | POA: Insufficient documentation

## 2022-07-08 NOTE — Progress Notes (Signed)
Established Patient Office Visit  Subjective:  Patient ID: Xavier Hebert, male    DOB: 1945-07-04  Age: 77 y.o. MRN: [id number]  Chief Complaint  Patient presents with   Follow-up    4 month follow up    Patient is here today for his 3 months follow up.  He has been feeling well since last appointment.   He does not have additional concerns to discuss today.  Labs are due today. He needs refills.  Asks if we can send refills of his testosterone as well as another med he used to get from his urologist.   I have reviewed his active problem list, medication list, allergies, notes from last encounter, lab results for his appointment today.   No other concerns at this time.   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, unspecified laterality 11/27/2019   Peptic ulcer    Sleep apnea    does not use cpap/ pt states he lost 60#   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 2 diabetes mellitus 07/17/2019   Status post laser cataract surgery of both eyes 05/10/2022    Past Surgical History:  Procedure Laterality Date   ANTERIOR CERVICAL DECOMP/DISCECTOMY FUSION  2009   CARPAL TUNNEL RELEASE Left 01/03/2018   Procedure: CARPAL TUNNEL RELEASE;  Surgeon: Kennedy Bucker, MD;  Location: [facility] ORS;  Service: Orthopedics;  Laterality: Left;   CARPOMETACARPAL ([facility]) FUSION OF THUMB Left 08/10/2020   Procedure: CARPOMETACARPAL [facility]) FUSION OF THUMB;  Surgeon: Kennedy Bucker, MD;  Location: [facility] ORS;  Service: Orthopedics;  Laterality: Left;   COLONOSCOPY WITH PROPOFOL N/A 11/21/2017   Procedure: COLONOSCOPY WITH PROPOFOL;  Surgeon: Tol[place],  Boykin Nearing, MD;  Location: [facility] ENDOSCOPY;  Service: Gastroenterology;  Laterality: N/A;   CYSTOSCOPY     x3- 2 times for stone removal    ESOPHAGOGASTRODUODENOSCOPY N/A 10/14/2017   Procedure: ESOPHAGOGASTRODUODENOSCOPY (EGD);  Surgeon: Toney Reil, MD;  Location: [facility] ENDOSCOPY;  Service: Gastroenterology;  Laterality: N/A;   ESOPHAGOGASTRODUODENOSCOPY (EGD) WITH PROPOFOL N/A 10/25/2017   Procedure: ESOPHAGOGASTRODUODENOSCOPY (EGD) WITH PROPOFOL;  Surgeon: Toney Reil, MD;  Location: [facility] ENDOSCOPY;  Service: Gastroenterology;  Laterality: N/A;   JOINT REPLACEMENT     bil. knees , shoulder rt, and neck    REVERSE SHOULDER ARTHROPLASTY Right 12/01/2014   REVERSE SHOULDER ARTHROPLASTY Right 12/01/2014   Procedure: REVERSE SHOULDER ARTHROPLASTY;  Surgeon: Cammy Copa, MD;  Location: [facility] OR;  Service: Orthopedics;  Laterality: Right;   SHOULDER ARTHROSCOPY W/ ROTATOR CUFF REPAIR Right 06/10/2014   "failed"   SHOULDER OPEN ROTATOR CUFF REPAIR Left ~ 2008   TOTAL KNEE ARTHROPLASTY Bilateral 2005-2008   "right-left"   TRIGGER FINGER RELEASE Right 2001   TRIGGER FINGER RELEASE Left 08/10/2020   Procedure: RELEASE TRIGGER FINGER/A-1 PULLEY;  Surgeon: Kennedy Bucker, MD;  Location: [facility] ORS;  Service: Orthopedics;  Laterality: Left;   URETERAL STENT PLACEMENT      Social History   Socioeconomic History   Marital status: Married    Spouse name: Not on file  Number of children: Not on file   Years of education: Not on file   Highest education level: Not on file  Occupational History   Not on file  Tobacco Use   Smoking status: Never   Smokeless tobacco: Never  Vaping Use   Vaping Use: Never used  Substance and Sexual Activity   Alcohol use: No   Drug use: No   Sexual activity: Not Currently  Other Topics Concern   Not on file  Social History Narrative   Lives at home with his wife. Independent at baseline.   Social Determinants of Health   Financial Resource  Strain: Not on file  Food Insecurity: Not on file  Transportation Needs: Not on file  Physical Activity: Not on file  Stress: Not on file  Social Connections: Not on file  Intimate Partner Violence: Not on file    Family History  Problem Relation Age of Onset   Dementia Mother    Alcohol abuse Father     Allergies  Allergen Reactions   Ivp Dye [Iodinated Contrast Media] Anaphylaxis and Other (See Comments)    Breathing difficulty and chest pain   Quinine Other (See Comments) and Shortness Of Breath    Other reaction(s): SHORTNESS OF BREATH   Amlodipine Swelling    Other reaction(s): Edema of lower extremity   Aspirin Other (See Comments)    Stomach pain   Glipizide Other (See Comments)    Other reaction(s): Chest pain   Hydrochlorothiazide Other (See Comments)    Other reaction(s): Impotence   Ibuprofen Other (See Comments)    Stomach pain  Other Reaction(s): ITCHING,WATERING EYES   Ioxaglate Other (See Comments)    Breathing difficulty and chest pain   Latex Itching   Nsaids     Kidneys   Omeprazole Other (See Comments)    Other reaction(s): FAILED THERAPY  Other Reaction(s): FAILED THERAPY   Pregabalin Other (See Comments)    Other reaction(s): Dizziness   Doxycycline Anxiety   Pioglitazone Diarrhea and Other (See Comments)    Other reaction(s): Diarrhea, Abdominal pain  Other Reaction(s): Diarrhea, Abdominal pain    Review of Systems  All other systems reviewed and are negative.      Objective:   BP 124/60   Pulse 89   Ht 6' (1.829 m)   Wt 176 lb 12.8 oz (80.2 kg)   SpO2 96%   BMI 23.98 kg/m   Vitals:   07/04/22 1411  BP: 124/60  Pulse: 89  Height: 6' (1.829 m)  Weight: 176 lb 12.8 oz (80.2 kg)  SpO2: 96%  BMI (Calculated): 23.97    Physical Exam Vitals and nursing note reviewed.  Constitutional:      Appearance: Normal appearance. He is normal weight.  Eyes:     Pupils: Pupils are equal, round, and reactive to light.   Cardiovascular:     Rate and Rhythm: Normal rate and regular rhythm.     Pulses: Normal pulses.     Heart sounds: Normal heart sounds.  Pulmonary:     Effort: Pulmonary effort is normal.     Breath sounds: Normal breath sounds.  Neurological:     Mental Status: He is alert.  Psychiatric:        Mood and Affect: Mood normal.        Behavior: Behavior normal.      Results for orders placed or performed in visit on 07/04/22  Lipid panel  Result Value Ref Range   Cholesterol, Total 176  100 - 199 mg/dL   Triglycerides 132 0 - 149 mg/dL   HDL 49 >44 mg/dL   VLDL Cholesterol Cal 26 5 - 40 mg/dL   LDL Chol Calc (NIH) 010 (H) 0 - 99 mg/dL   Chol/HDL Ratio 3.6 0.0 - 5.0 ratio  VITAMIN D 25 Hydroxy (Vit-D Deficiency, Fractures)  Result Value Ref Range   Vit D, 25-Hydroxy 57.8 30.0 - 100.0 ng/mL  CBC With Differential  Result Value Ref Range   WBC 5.7 3.4 - 10.8 x10E3/uL   RBC 4.88 4.14 - 5.80 x10E6/uL   Hemoglobin 16.3 13.0 - 17.7 g/dL   Hematocrit 27.2 53.6 - 51.0 %   MCV 96 79 - 97 fL   MCH 33.4 (H) 26.6 - 33.0 pg   MCHC 35.0 31.5 - 35.7 g/dL   RDW 64.4 03.4 - 74.2 %   Neutrophils 71 Not Estab. %   Lymphs 14 Not Estab. %   Monocytes 9 Not Estab. %   Eos 4 Not Estab. %   Basos 1 Not Estab. %   Neutrophils Absolute 4.1 1.4 - 7.0 x10E3/uL   Lymphocytes Absolute 0.8 0.7 - 3.1 x10E3/uL   Monocytes Absolute 0.5 0.1 - 0.9 x10E3/uL   EOS (ABSOLUTE) 0.2 0.0 - 0.4 x10E3/uL   Basophils Absolute 0.0 0.0 - 0.2 x10E3/uL   Immature Granulocytes 1 Not Estab. %   Immature Grans (Abs) 0.0 0.0 - 0.1 x10E3/uL  CMP14+EGFR  Result Value Ref Range   Glucose 100 (H) 70 - 99 mg/dL   BUN 29 (H) 8 - 27 mg/dL   Creatinine, Ser 5.95 (H) 0.76 - 1.27 mg/dL   eGFR 35 (L) >63 OV/FIE/3.32   BUN/Creatinine Ratio 15 10 - 24   Sodium 141 134 - 144 mmol/L   Potassium 4.7 3.5 - 5.2 mmol/L   Chloride 103 96 - 106 mmol/L   CO2 23 20 - 29 mmol/L   Calcium 9.8 8.6 - 10.2 mg/dL   Total Protein 7.1 6.0 -  8.5 g/dL   Albumin 4.5 3.8 - 4.8 g/dL   Globulin, Total 2.6 1.5 - 4.5 g/dL   Albumin/Globulin Ratio 1.7 1.2 - 2.2   Bilirubin Total 0.4 0.0 - 1.2 mg/dL   Alkaline Phosphatase 110 44 - 121 IU/L   AST 15 0 - 40 IU/L   ALT 15 0 - 44 IU/L  TSH  Result Value Ref Range   TSH 1.050 0.450 - 4.500 uIU/mL  Hemoglobin A1c  Result Value Ref Range   Hgb A1c MFr Bld 6.4 (H) 4.8 - 5.6 %   Est. average glucose Bld gHb Est-mCnc 137 mg/dL  Vitamin R51  Result Value Ref Range   Vitamin B-12 >2000 (H) 232 - 1245 pg/mL    Recent Results (from the past 2160 hour(s))  Lipid panel     Status: Abnormal   Collection Time: 07/04/22  3:53 PM  Result Value Ref Range   Cholesterol, Total 176 100 - 199 mg/dL   Triglycerides 884 0 - 149 mg/dL   HDL 49 >16 mg/dL   VLDL Cholesterol Cal 26 5 - 40 mg/dL   LDL Chol Calc (NIH) 606 (H) 0 - 99 mg/dL   Chol/HDL Ratio 3.6 0.0 - 5.0 ratio    Comment:                                   T. Chol/HDL Ratio  Men  Women                               1/2 Avg.Risk  3.4    3.3                                   Avg.Risk  5.0    4.4                                2X Avg.Risk  9.6    7.1                                3X Avg.Risk 23.4   11.0   VITAMIN D 25 Hydroxy (Vit-D Deficiency, Fractures)     Status: None   Collection Time: 07/04/22  3:53 PM  Result Value Ref Range   Vit D, 25-Hydroxy 57.8 30.0 - 100.0 ng/mL    Comment: Vitamin D deficiency has been defined by the Institute of Medicine and an Endocrine Society practice guideline as a level of serum 25-OH vitamin D less than 20 ng/mL (1,2). The Endocrine Society went on to further define vitamin D insufficiency as a level between 21 and 29 ng/mL (2). 1. IOM (Institute of Medicine). 2010. Dietary reference    intakes for calcium and D. [place]: The    [employer]. 2. Holick MF, Binkley Garland, Bischoff-Ferrari HA, et al.    Evaluation, treatment, and prevention  of vitamin D    deficiency: an Endocrine Society clinical practice    guideline. JCEM. 2011 Jul; 96(7):1911-30.   CBC With Differential     Status: Abnormal   Collection Time: 07/04/22  3:53 PM  Result Value Ref Range   WBC 5.7 3.4 - 10.8 x10E3/uL   RBC 4.88 4.14 - 5.80 x10E6/uL   Hemoglobin 16.3 13.0 - 17.7 g/dL   Hematocrit 16.1 09.6 - 51.0 %   MCV 96 79 - 97 fL   MCH 33.4 (H) 26.6 - 33.0 pg   MCHC 35.0 31.5 - 35.7 g/dL   RDW 04.5 40.9 - 81.1 %   Neutrophils 71 Not Estab. %   Lymphs 14 Not Estab. %   Monocytes 9 Not Estab. %   Eos 4 Not Estab. %   Basos 1 Not Estab. %   Neutrophils Absolute 4.1 1.4 - 7.0 x10E3/uL   Lymphocytes Absolute 0.8 0.7 - 3.1 x10E3/uL   Monocytes Absolute 0.5 0.1 - 0.9 x10E3/uL   EOS (ABSOLUTE) 0.2 0.0 - 0.4 x10E3/uL   Basophils Absolute 0.0 0.0 - 0.2 x10E3/uL   Immature Granulocytes 1 Not Estab. %   Immature Grans (Abs) 0.0 0.0 - 0.1 x10E3/uL  CMP14+EGFR     Status: Abnormal   Collection Time: 07/04/22  3:53 PM  Result Value Ref Range   Glucose 100 (H) 70 - 99 mg/dL   BUN 29 (H) 8 - 27 mg/dL   Creatinine, Ser 9.14 (H) 0.76 - 1.27 mg/dL   eGFR 35 (L) >78 GN/FAO/1.30   BUN/Creatinine Ratio 15 10 - 24   Sodium 141 134 - 144 mmol/L   Potassium 4.7 3.5 - 5.2 mmol/L   Chloride 103 96 - 106 mmol/L   CO2 23 20 - 29 mmol/L   Calcium 9.8 8.6 -  10.2 mg/dL   Total Protein 7.1 6.0 - 8.5 g/dL   Albumin 4.5 3.8 - 4.8 g/dL   Globulin, Total 2.6 1.5 - 4.5 g/dL   Albumin/Globulin Ratio 1.7 1.2 - 2.2   Bilirubin Total 0.4 0.0 - 1.2 mg/dL   Alkaline Phosphatase 110 44 - 121 IU/L   AST 15 0 - 40 IU/L   ALT 15 0 - 44 IU/L  TSH     Status: None   Collection Time: 07/04/22  3:53 PM  Result Value Ref Range   TSH 1.050 0.450 - 4.500 uIU/mL  Hemoglobin A1c     Status: Abnormal   Collection Time: 07/04/22  3:53 PM  Result Value Ref Range   Hgb A1c MFr Bld 6.4 (H) 4.8 - 5.6 %    Comment:          Prediabetes: 5.7 - 6.4          Diabetes: >6.4          Glycemic  control for adults with diabetes: <7.0    Est. average glucose Bld gHb Est-mCnc 137 mg/dL  Vitamin U98     Status: Abnormal   Collection Time: 07/04/22  3:53 PM  Result Value Ref Range   Vitamin B-12 >2000 (H) 232 - 1245 pg/mL       Assessment & Plan:   Problem List Items Addressed This Visit     Diabetes   Relevant Medications   Semaglutide, 1 MG/DOSE, 4 MG/3ML SOPN   Other Relevant Orders   Hemoglobin A1c (Completed)   Essential hypertension - Primary   Hyperlipidemia, mixed   Migraines   Relevant Medications   NUCYNTA ER 50 MG 12 hr tablet   Other Visit Diagnoses     Vitamin D deficiency, unspecified       Relevant Orders   VITAMIN D 25 Hydroxy (Vit-D Deficiency, Fractures) (Completed)   B12 deficiency due to diet       Relevant Medications   cyanocobalamin (VITAMIN B12) injection 1,000 mcg (Completed)   Other Relevant Orders   Vitamin B12 (Completed)   Hypothyroidism (acquired)       Relevant Orders   TSH (Completed)       Return in about 4 months (around 11/03/2022).   Total time spent: 20 minutes  Miki Kins, FNP  07/04/2022

## 2022-07-11 ENCOUNTER — Ambulatory Visit (INDEPENDENT_AMBULATORY_CARE_PROVIDER_SITE_OTHER): Payer: Medicare Other | Admitting: Podiatry

## 2022-07-11 DIAGNOSIS — B351 Tinea unguium: Secondary | ICD-10-CM | POA: Diagnosis not present

## 2022-07-11 DIAGNOSIS — M79674 Pain in right toe(s): Secondary | ICD-10-CM

## 2022-07-11 DIAGNOSIS — M79675 Pain in left toe(s): Secondary | ICD-10-CM

## 2022-07-11 NOTE — Progress Notes (Signed)
Chief Complaint  Patient presents with   Diabetes    Patient came in today for diabetic foot care, nail trim, A1c- 6.4     SUBJECTIVE Patient with a history of diabetes mellitus presents to office today complaining of elongated, thickened nails that cause pain while ambulating in shoes.  Patient is unable to trim their own nails. Patient is here for further evaluation and treatment.  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, unspecified laterality 11/27/2019   Peptic ulcer    Sleep apnea    does not use cpap/ pt states he lost 60#   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 2 diabetes mellitus 07/17/2019   Status post laser cataract surgery of both eyes 05/10/2022    Allergies  Allergen Reactions   Ivp Dye [Iodinated Contrast Media] Anaphylaxis and Other (See Comments)    Breathing difficulty and chest pain   Quinine Other (See Comments) and Shortness Of Breath    Other reaction(s): SHORTNESS OF BREATH   Amlodipine Swelling    Other reaction(s): Edema of lower extremity   Aspirin Other (See Comments)    Stomach pain   Glipizide Other (See Comments)    Other reaction(s): Chest pain   Hydrochlorothiazide Other (See Comments)    Other reaction(s): Impotence   Ibuprofen Other (See Comments)    Stomach pain  Other Reaction(s): ITCHING,WATERING EYES   Ioxaglate Other (See Comments)    Breathing difficulty and chest pain   Latex Itching   Nsaids     Kidneys   Omeprazole Other (See Comments)    Other reaction(s): FAILED THERAPY  Other Reaction(s): FAILED THERAPY   Pregabalin Other (See Comments)    Other  reaction(s): Dizziness   Doxycycline Anxiety   Pioglitazone Diarrhea and Other (See Comments)    Other reaction(s): Diarrhea, Abdominal pain  Other Reaction(s): Diarrhea, Abdominal pain     OBJECTIVE General Patient is awake, alert, and oriented x 3 and in no acute distress. Derm Skin is dry and supple bilateral. Negative open lesions or macerations. Remaining integument unremarkable. Nails are tender, long, thickened and dystrophic with subungual debris, consistent with onychomycosis, 1-5 bilateral. No signs of infection noted. Vasc  DP and PT pedal pulses palpable bilaterally. Temperature gradient within normal limits.  Neuro Epicritic and protective threshold sensation diminished bilaterally.  Musculoskeletal Exam No symptomatic pedal deformities noted bilateral. Muscular strength within normal limits.  ASSESSMENT 1. Diabetes Mellitus w/ peripheral neuropathy 2.  Pain due to onychomycosis of toenails bilateral  PLAN OF CARE 1. Patient evaluated today. 2. Instructed to maintain good pedal hygiene and foot care. Stressed importance of controlling blood sugar.  3. Mechanical debridement of nails 1-5 bilaterally performed using a nail nipper. Filed with dremel without incident.  4. Return to clinic in 3 mos.     Felecia Shelling, DPM [facility]  Dr. Felecia Shelling, DPM    [address]  [place], [place] [address]                Office [phone]  Fax [phone]

## 2022-08-06 DIAGNOSIS — M549 Dorsalgia, unspecified: Secondary | ICD-10-CM | POA: Insufficient documentation

## 2022-08-06 DIAGNOSIS — G44219 Episodic tension-type headache, not intractable: Secondary | ICD-10-CM

## 2022-08-06 DIAGNOSIS — H409 Unspecified glaucoma: Secondary | ICD-10-CM

## 2022-08-06 DIAGNOSIS — Z87442 Personal history of urinary calculi: Secondary | ICD-10-CM

## 2022-08-06 HISTORY — DX: Unspecified glaucoma: H40.9

## 2022-08-06 HISTORY — DX: Episodic tension-type headache, not intractable: G44.219

## 2022-08-06 HISTORY — DX: Personal history of urinary calculi: Z87.442

## 2022-08-16 ENCOUNTER — Ambulatory Visit (INDEPENDENT_AMBULATORY_CARE_PROVIDER_SITE_OTHER): Payer: Medicare Other | Admitting: Family

## 2022-08-16 ENCOUNTER — Encounter: Payer: Self-pay | Admitting: Family

## 2022-08-16 VITALS — BP 132/88 | HR 85 | Ht 72.0 in | Wt 168.0 lb

## 2022-08-16 DIAGNOSIS — Z7729 Contact with and (suspected ) exposure to other hazardous substances: Secondary | ICD-10-CM | POA: Insufficient documentation

## 2022-08-16 DIAGNOSIS — S8990XA Unspecified injury of unspecified lower leg, initial encounter: Secondary | ICD-10-CM | POA: Insufficient documentation

## 2022-08-16 DIAGNOSIS — M5126 Other intervertebral disc displacement, lumbar region: Secondary | ICD-10-CM | POA: Diagnosis not present

## 2022-08-16 DIAGNOSIS — E1122 Type 2 diabetes mellitus with diabetic chronic kidney disease: Secondary | ICD-10-CM | POA: Diagnosis not present

## 2022-08-16 DIAGNOSIS — E119 Type 2 diabetes mellitus without complications: Secondary | ICD-10-CM | POA: Insufficient documentation

## 2022-08-16 DIAGNOSIS — T8484XA Pain due to internal orthopedic prosthetic devices, implants and grafts, initial encounter: Secondary | ICD-10-CM | POA: Insufficient documentation

## 2022-08-16 DIAGNOSIS — H2511 Age-related nuclear cataract, right eye: Secondary | ICD-10-CM | POA: Insufficient documentation

## 2022-08-16 DIAGNOSIS — K277 Chronic peptic ulcer, site unspecified, without hemorrhage or perforation: Secondary | ICD-10-CM | POA: Insufficient documentation

## 2022-08-16 DIAGNOSIS — I872 Venous insufficiency (chronic) (peripheral): Secondary | ICD-10-CM | POA: Insufficient documentation

## 2022-08-16 DIAGNOSIS — N1831 Chronic kidney disease, stage 3a: Secondary | ICD-10-CM | POA: Insufficient documentation

## 2022-08-16 DIAGNOSIS — Z96659 Presence of unspecified artificial knee joint: Secondary | ICD-10-CM | POA: Insufficient documentation

## 2022-08-16 DIAGNOSIS — N184 Chronic kidney disease, stage 4 (severe): Secondary | ICD-10-CM

## 2022-08-16 DIAGNOSIS — E291 Testicular hypofunction: Secondary | ICD-10-CM | POA: Insufficient documentation

## 2022-08-16 DIAGNOSIS — N183 Chronic kidney disease, stage 3 unspecified: Secondary | ICD-10-CM | POA: Insufficient documentation

## 2022-08-16 DIAGNOSIS — G4733 Obstructive sleep apnea (adult) (pediatric): Secondary | ICD-10-CM | POA: Insufficient documentation

## 2022-08-16 DIAGNOSIS — Z794 Long term (current) use of insulin: Secondary | ICD-10-CM

## 2022-08-16 DIAGNOSIS — M4727 Other spondylosis with radiculopathy, lumbosacral region: Secondary | ICD-10-CM | POA: Diagnosis not present

## 2022-08-16 DIAGNOSIS — E785 Hyperlipidemia, unspecified: Secondary | ICD-10-CM | POA: Insufficient documentation

## 2022-08-16 DIAGNOSIS — T84022D Instability of internal right knee prosthesis, subsequent encounter: Secondary | ICD-10-CM

## 2022-08-16 DIAGNOSIS — K5730 Diverticulosis of large intestine without perforation or abscess without bleeding: Secondary | ICD-10-CM | POA: Insufficient documentation

## 2022-08-16 DIAGNOSIS — M109 Gout, unspecified: Secondary | ICD-10-CM | POA: Insufficient documentation

## 2022-08-16 HISTORY — DX: Contact with and (suspected) exposure to other hazardous substances: Z77.29

## 2022-08-16 HISTORY — DX: Chronic kidney disease, stage 3a: N18.31

## 2022-08-16 MED ORDER — PREDNISONE 10 MG PO TABS: 20.0000 mg | ORAL_TABLET | Freq: Every day | ORAL | 0 refills | Status: DC

## 2022-08-16 NOTE — Assessment & Plan Note (Addendum)
Setting up physical therapy and occupational therapy at home.  Sending refill for prednisone to help with inflammation.  

## 2022-08-16 NOTE — Assessment & Plan Note (Signed)
Setting up physical therapy and occupational therapy at home.  Sending refill for prednisone to help with inflammation.  

## 2022-08-16 NOTE — Assessment & Plan Note (Signed)
Stable.  Pt. Is under the care of nephrology at the [facility].  Will defer to them for changes.

## 2022-08-16 NOTE — Assessment & Plan Note (Signed)
Checking labs today. Will call pt. With results  Continue current diabetes POC, as patient has been well controlled on current regimen.  Will adjust meds if needed based on labs.  

## 2022-08-16 NOTE — Assessment & Plan Note (Signed)
Setting up physical therapy and occupational therapy at home.  Sending refill for prednisone to help with inflammation.

## 2022-08-16 NOTE — Progress Notes (Signed)
Established Patient Office Visit  Subjective:  Patient ID: Xavier Hebert, male    DOB: January 18, 1946  Age: 77 y.o. MRN: [id number]  Chief Complaint  Patient presents with   Back Pain    Back Pain This is a recurrent problem. The current episode started in the past 7 days. The problem occurs constantly. The problem has been gradually improving since onset. The pain is present in the gluteal and lumbar spine. The quality of the pain is described as shooting. The pain radiates to the right thigh. The pain is at a severity of 8/10. The pain is severe. The pain is The same all the time. The symptoms are aggravated by lying down, sitting, twisting and position. Stiffness is present All day. He has tried analgesics, heat, ice and muscle relaxant for the symptoms. The treatment provided mild relief.    No other concerns at this time.   Past Medical History:  Diagnosis Date   Age-related nuclear cataract of both eyes 01/21/2022   Anxiety    Arthritis    Chronic kidney disease, stage 3a (HCC) 08/16/2022   Aug 16, 2022 Entered By: Sharl Ma A Comment: b/l Cr ~2-2.2   Chronic pain syndrome    CKD (chronic kidney disease) stage 3, GFR 30-59 ml/min (HCC)    Depression    Diabetes mellitus without complication (HCC)    Type II   Episodic tension-type headache, not intractable 08/06/2022   Exposure to potentially hazardous substance 08/16/2022   Jul 05, 2022 Entered By: Derinda Late Comment: Entered automatically through TES Problem List documentation program   GERD (gastroesophageal reflux disease)    Headache    Migraine- headache   History of urinary stone 08/06/2022   Hyperlipidemia    Hypertension    Kidney stones    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, unspecified laterality 11/27/2019   Peptic ulcer    Sleep apnea    does not use cpap/ pt states he lost 60#   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) 07/17/2019   Status post laser  cataract surgery of both eyes 05/10/2022   Unspecified glaucoma 08/06/2022    Past Surgical History:  Procedure Laterality Date   ANTERIOR CERVICAL DECOMP/DISCECTOMY FUSION  2009   CARPAL TUNNEL RELEASE Left 01/03/2018   Procedure: CARPAL TUNNEL RELEASE;  Surgeon: Kennedy Bucker, MD;  Location: [facility] ORS;  Service: Orthopedics;  Laterality: Left;   CARPOMETACARPAL (CMC) FUSION OF THUMB Left 08/10/2020   Procedure: CARPOMETACARPAL [facility]) FUSION OF THUMB;  Surgeon: Kennedy Bucker, MD;  Location: [facility] ORS;  Service: Orthopedics;  Laterality: Left;   COLONOSCOPY WITH PROPOFOL N/A 11/21/2017   Procedure: COLONOSCOPY WITH PROPOFOL;  Surgeon: Toledo, Boykin Nearing, MD;  Location: [facility] ENDOSCOPY;  Service: Gastroenterology;  Laterality: N/A;   CYSTOSCOPY     x3- 2 times for stone removal    ESOPHAGOGASTRODUODENOSCOPY N/A 10/14/2017   Procedure: ESOPHAGOGASTRODUODENOSCOPY (EGD);  Surgeon: Toney Reil, MD;  Location: [facility] ENDOSCOPY;  Service: Gastroenterology;  Laterality: N/A;   ESOPHAGOGASTRODUODENOSCOPY (EGD) WITH PROPOFOL N/A 10/25/2017   Procedure: ESOPHAGOGASTRODUODENOSCOPY (EGD) WITH PROPOFOL;  Surgeon: Toney Reil, MD;  Location: [facility] ENDOSCOPY;  Service: Gastroenterology;  Laterality: N/A;   JOINT REPLACEMENT     bil. knees , shoulder rt, and neck    REVERSE SHOULDER ARTHROPLASTY Right 12/01/2014   REVERSE SHOULDER ARTHROPLASTY Right 12/01/2014   Procedure: REVERSE SHOULDER ARTHROPLASTY;  Surgeon: Cammy Copa, MD;  Location: [facility] OR;  Service: Orthopedics;  Laterality: Right;  SHOULDER ARTHROSCOPY W/ ROTATOR CUFF REPAIR Right 06/10/2014   "failed"   SHOULDER OPEN ROTATOR CUFF REPAIR Left ~ 2008   TOTAL KNEE ARTHROPLASTY Bilateral 2005-2008   "right-left"   TRIGGER FINGER RELEASE Right 2001   TRIGGER FINGER RELEASE Left 08/10/2020   Procedure: RELEASE TRIGGER FINGER/A-1 PULLEY;  Surgeon: Kennedy Bucker, MD;  Location: [facility] ORS;  Service: Orthopedics;  Laterality: Left;   URETERAL  STENT PLACEMENT      Social History   Socioeconomic History   Marital status: Married    Spouse name: Not on file   Number of children: Not on file   Years of education: Not on file   Highest education level: Not on file  Occupational History   Not on file  Tobacco Use   Smoking status: Never   Smokeless tobacco: Never  Vaping Use   Vaping Use: Never used  Substance and Sexual Activity   Alcohol use: No   Drug use: No   Sexual activity: Not Currently  Other Topics Concern   Not on file  Social History Narrative   Lives at home with his wife. Independent at baseline.   Social Determinants of Health   Financial Resource Strain: Not on file  Food Insecurity: Not on file  Transportation Needs: Not on file  Physical Activity: Not on file  Stress: Not on file  Social Connections: Not on file  Intimate Partner Violence: Not on file    Family History  Problem Relation Age of Onset   Dementia Mother    Alcohol abuse Father     Allergies  Allergen Reactions   Ivp Dye [Iodinated Contrast Media] Anaphylaxis and Other (See Comments)    Breathing difficulty and chest pain   Quinine Other (See Comments), Shortness Of Breath and Rash    Other reaction(s): SHORTNESS OF BREATH  Other reaction(s): Other (See Comments)  Other reaction(s): SHORTNESS OF BREATH  Other reaction(s): SHORTNESS OF BREATH  Other reaction(s): SHORTNESS OF BREATH   Amlodipine Swelling    Other reaction(s): Edema of lower extremity   Aspirin Other (See Comments)    Stomach pain   Gabapentin     Other reaction(s): Dizziness   Glipizide Other (See Comments)    Other reaction(s): Chest pain  Other reaction(s): Other (See Comments)  Other reaction(s): Chest pain  Other reaction(s): Chest pain   Hydrochlorothiazide Other (See Comments)    Other reaction(s): Impotence   Ibuprofen Other (See Comments)    Stomach pain  Other Reaction(s): ITCHING,WATERING EYES   Ioxaglate Other (See Comments)     Breathing difficulty and chest pain   Latex Itching   Nsaids     Kidneys   Omeprazole Other (See Comments)    Other reaction(s): FAILED THERAPY  Other Reaction(s): FAILED THERAPY   Pregabalin Other (See Comments)    Other reaction(s): Dizziness  Other reaction(s): Dizziness  Other reaction(s): Dizziness  Other reaction(s): Dizziness  Other reaction(s): Dizziness   Doxycycline Anxiety   Pioglitazone Diarrhea and Other (See Comments)    Other reaction(s): Diarrhea, Abdominal pain  Other Reaction(s): Diarrhea, Abdominal pain    Review of Systems  Musculoskeletal:  Positive for back pain.  All other systems reviewed and are negative.      Objective:   BP 132/88   Pulse 85   Ht 6' (1.829 m)   Wt 168 lb (76.2 kg)   SpO2 96%   BMI 22.78 kg/m   Vitals:   08/16/22 1012  BP: 132/88  Pulse: 85  Height:  6' (1.829 m)  Weight: 168 lb (76.2 kg)  SpO2: 96%  BMI (Calculated): 22.78    Physical Exam Vitals and nursing note reviewed.  Constitutional:      Appearance: Normal appearance. He is normal weight.  Eyes:     Extraocular Movements: Extraocular movements intact.     Conjunctiva/sclera: Conjunctivae normal.     Pupils: Pupils are equal, round, and reactive to light.  Cardiovascular:     Rate and Rhythm: Normal rate and regular rhythm.     Pulses: Normal pulses.  Pulmonary:     Effort: Pulmonary effort is normal.  Neurological:     General: No focal deficit present.     Mental Status: He is alert and oriented to person, place, and time. Mental status is at baseline.  Psychiatric:        Mood and Affect: Mood normal.        Behavior: Behavior normal.        Thought Content: Thought content normal.        Judgment: Judgment normal.      No results found for any visits on 08/16/22.  Recent Results (from the past 2160 hour(s))  Lipid panel     Status: Abnormal   Collection Time: 07/04/22  3:53 PM  Result Value Ref Range   Cholesterol, Total 176 100 - 199  mg/dL   Triglycerides 161 0 - 149 mg/dL   HDL 49 >09 mg/dL   VLDL Cholesterol Cal 26 5 - 40 mg/dL   LDL Chol Calc (NIH) 604 (H) 0 - 99 mg/dL   Chol/HDL Ratio 3.6 0.0 - 5.0 ratio    Comment:                                   T. Chol/HDL Ratio                                             Men  Women                               1/2 Avg.Risk  3.4    3.3                                   Avg.Risk  5.0    4.4                                2X Avg.Risk  9.6    7.1                                3X Avg.Risk 23.4   11.0   VITAMIN D 25 Hydroxy (Vit-D Deficiency, Fractures)     Status: None   Collection Time: 07/04/22  3:53 PM  Result Value Ref Range   Vit D, 25-Hydroxy 57.8 30.0 - 100.0 ng/mL    Comment: Vitamin D deficiency has been defined by the Institute of Medicine and an Endocrine Society practice guideline as a level of serum 25-OH vitamin D less than 20 ng/mL (1,2). The Endocrine Society went on to  further define vitamin D insufficiency as a level between 21 and 29 ng/mL (2). 1. IOM (Institute of Medicine). 2010. Dietary reference    intakes for calcium and D. [place]: The    [employer]. 2. Holick MF, Binkley Rentchler, Bischoff-Ferrari HA, et al.    Evaluation, treatment, and prevention of vitamin D    deficiency: an Endocrine Society clinical practice    guideline. JCEM. 2011 Jul; 96(7):1911-30.   CBC With Differential     Status: Abnormal   Collection Time: 07/04/22  3:53 PM  Result Value Ref Range   WBC 5.7 3.4 - 10.8 x10E3/uL   RBC 4.88 4.14 - 5.80 x10E6/uL   Hemoglobin 16.3 13.0 - 17.7 g/dL   Hematocrit 95.1 88.4 - 51.0 %   MCV 96 79 - 97 fL   MCH 33.4 (H) 26.6 - 33.0 pg   MCHC 35.0 31.5 - 35.7 g/dL   RDW 16.6 06.3 - 01.6 %   Neutrophils 71 Not Estab. %   Lymphs 14 Not Estab. %   Monocytes 9 Not Estab. %   Eos 4 Not Estab. %   Basos 1 Not Estab. %   Neutrophils Absolute 4.1 1.4 - 7.0 x10E3/uL   Lymphocytes Absolute 0.8 0.7 - 3.1 x10E3/uL   Monocytes  Absolute 0.5 0.1 - 0.9 x10E3/uL   EOS (ABSOLUTE) 0.2 0.0 - 0.4 x10E3/uL   Basophils Absolute 0.0 0.0 - 0.2 x10E3/uL   Immature Granulocytes 1 Not Estab. %   Immature Grans (Abs) 0.0 0.0 - 0.1 x10E3/uL  CMP14+EGFR     Status: Abnormal   Collection Time: 07/04/22  3:53 PM  Result Value Ref Range   Glucose 100 (H) 70 - 99 mg/dL   BUN 29 (H) 8 - 27 mg/dL   Creatinine, Ser 0.10 (H) 0.76 - 1.27 mg/dL   eGFR 35 (L) >93 AT/FTD/3.22   BUN/Creatinine Ratio 15 10 - 24   Sodium 141 134 - 144 mmol/L   Potassium 4.7 3.5 - 5.2 mmol/L   Chloride 103 96 - 106 mmol/L   CO2 23 20 - 29 mmol/L   Calcium 9.8 8.6 - 10.2 mg/dL   Total Protein 7.1 6.0 - 8.5 g/dL   Albumin 4.5 3.8 - 4.8 g/dL   Globulin, Total 2.6 1.5 - 4.5 g/dL   Albumin/Globulin Ratio 1.7 1.2 - 2.2   Bilirubin Total 0.4 0.0 - 1.2 mg/dL   Alkaline Phosphatase 110 44 - 121 IU/L   AST 15 0 - 40 IU/L   ALT 15 0 - 44 IU/L  TSH     Status: None   Collection Time: 07/04/22  3:53 PM  Result Value Ref Range   TSH 1.050 0.450 - 4.500 uIU/mL  Hemoglobin A1c     Status: Abnormal   Collection Time: 07/04/22  3:53 PM  Result Value Ref Range   Hgb A1c MFr Bld 6.4 (H) 4.8 - 5.6 %    Comment:          Prediabetes: 5.7 - 6.4          Diabetes: >6.4          Glycemic control for adults with diabetes: <7.0    Est. average glucose Bld gHb Est-mCnc 137 mg/dL  Vitamin G25     Status: Abnormal   Collection Time: 07/04/22  3:53 PM  Result Value Ref Range   Vitamin B-12 >2000 (H) 232 - 1245 pg/mL       Assessment & Plan:   Problem List Items Addressed  This Visit       Active Problems   Unspecified osteoarthritis, unspecified site    Setting up physical therapy and occupational therapy at home.  Sending refill for prednisone to help with inflammation.       Relevant Medications   predniSONE (DELTASONE) 10 MG tablet   Other Relevant Orders   Ambulatory referral to Home Health   Displacement of lumbar intervertebral disc without myelopathy -  Primary    Setting up physical therapy and occupational therapy at home.  Sending refill for prednisone to help with inflammation.       Relevant Orders   Ambulatory referral to Home Health   Chronic kidney disease, stage 4 (severe) (HCC)    Stable.  Pt. Is under the care of nephrology at the [facility].  Will defer to them for changes.       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  Sending refill for prednisone to help with inflammation.       Relevant Orders   Ambulatory referral to Home Health   Type 2 diabetes mellitus with kidney complication, with long-term current use of insulin (HCC)    Checking labs today. Will call pt. With results  Continue current diabetes POC, as patient has been well controlled on current regimen.  Will adjust meds if needed based on labs.        Return as previously scheduled.   Total time spent: 30 minutes  Miki Kins, FNP  08/16/2022   This document may have been prepared by [facility] Voice Recognition software and as such may include unintentional dictation errors.

## 2022-08-17 ENCOUNTER — Other Ambulatory Visit: Payer: Self-pay | Admitting: Family

## 2022-08-17 ENCOUNTER — Ambulatory Visit: Payer: Medicare Other | Admitting: Family

## 2022-08-25 ENCOUNTER — Other Ambulatory Visit: Payer: Self-pay | Admitting: Family

## 2022-08-28 ENCOUNTER — Other Ambulatory Visit: Payer: Self-pay

## 2022-09-01 ENCOUNTER — Telehealth: Payer: Self-pay | Admitting: Family

## 2022-09-01 NOTE — Telephone Encounter (Signed)
Patient left VM needing refill of his testosterone gel sent to the pharmacy in [facility]. Please advise.

## 2022-09-01 NOTE — Telephone Encounter (Signed)
Zacharia with [facility] called to inform us that patient is declining OT HH because he is not home bound. Just FYI.

## 2022-09-08 ENCOUNTER — Telehealth: Payer: Self-pay | Admitting: Family

## 2022-09-08 NOTE — Telephone Encounter (Signed)
Patient needs testosterone cream 20% sent to [facility]

## 2022-09-28 ENCOUNTER — Other Ambulatory Visit: Payer: Self-pay

## 2022-09-28 MED ORDER — TESTOSTERONE 20 % CREA: 1.0000 | TOPICAL_CREAM | Freq: Two times a day (BID) | 2 refills | Status: DC

## 2022-09-29 ENCOUNTER — Telehealth: Payer: Self-pay | Admitting: Family

## 2022-09-29 NOTE — Telephone Encounter (Signed)
Patient left VM for his Tri-Mix to be sent to [facility]. This has already been faxed. Called patient and notified him.

## 2022-10-02 ENCOUNTER — Other Ambulatory Visit: Payer: Self-pay | Admitting: Family

## 2022-10-10 ENCOUNTER — Ambulatory Visit: Payer: Medicare Other | Admitting: Podiatry

## 2022-10-23 ENCOUNTER — Telehealth: Payer: Self-pay | Admitting: Family

## 2022-10-23 NOTE — Telephone Encounter (Signed)
Patient left VM that [facility] contacted him and told him that his methocarbamol needs a PA. Says they have contacted our office. Patient states he is running low on this medication so this is urgent now.   Didn't you say you had done this PA?

## 2022-10-31 ENCOUNTER — Encounter: Payer: Self-pay | Admitting: Podiatry

## 2022-10-31 ENCOUNTER — Ambulatory Visit (INDEPENDENT_AMBULATORY_CARE_PROVIDER_SITE_OTHER): Payer: Medicare Other | Admitting: Podiatry

## 2022-10-31 DIAGNOSIS — B351 Tinea unguium: Secondary | ICD-10-CM

## 2022-10-31 DIAGNOSIS — M79675 Pain in left toe(s): Secondary | ICD-10-CM | POA: Diagnosis not present

## 2022-10-31 DIAGNOSIS — M79674 Pain in right toe(s): Secondary | ICD-10-CM | POA: Diagnosis not present

## 2022-11-03 ENCOUNTER — Ambulatory Visit (INDEPENDENT_AMBULATORY_CARE_PROVIDER_SITE_OTHER): Payer: Medicare Other | Admitting: Family

## 2022-11-03 ENCOUNTER — Encounter: Payer: Self-pay | Admitting: Family

## 2022-11-03 VITALS — BP 100/70 | HR 87 | Ht 72.0 in | Wt 168.2 lb

## 2022-11-03 DIAGNOSIS — E782 Mixed hyperlipidemia: Secondary | ICD-10-CM

## 2022-11-03 DIAGNOSIS — E559 Vitamin D deficiency, unspecified: Secondary | ICD-10-CM | POA: Diagnosis not present

## 2022-11-03 DIAGNOSIS — E1122 Type 2 diabetes mellitus with diabetic chronic kidney disease: Secondary | ICD-10-CM

## 2022-11-03 DIAGNOSIS — Z794 Long term (current) use of insulin: Secondary | ICD-10-CM | POA: Diagnosis not present

## 2022-11-03 DIAGNOSIS — N184 Chronic kidney disease, stage 4 (severe): Secondary | ICD-10-CM

## 2022-11-03 DIAGNOSIS — E538 Deficiency of other specified B group vitamins: Secondary | ICD-10-CM | POA: Diagnosis not present

## 2022-11-03 DIAGNOSIS — E08622 Diabetes mellitus due to underlying condition with other skin ulcer: Secondary | ICD-10-CM | POA: Insufficient documentation

## 2022-11-03 DIAGNOSIS — E039 Hypothyroidism, unspecified: Secondary | ICD-10-CM

## 2022-11-03 DIAGNOSIS — M1000 Idiopathic gout, unspecified site: Secondary | ICD-10-CM

## 2022-11-03 LAB — POCT CBG (FASTING - GLUCOSE)-MANUAL ENTRY: Glucose Fasting, POC: 120 mg/dL — AB (ref 70–99)

## 2022-11-03 MED ORDER — CYANOCOBALAMIN 1000 MCG/ML IJ SOLN
1000.0000 ug | Freq: Once | INTRAMUSCULAR | Status: AC
Start: 2022-11-03 — End: 2022-11-03
  Administered 2022-11-03: 1000 ug via INTRAMUSCULAR

## 2022-11-03 NOTE — Assessment & Plan Note (Signed)
Patient is seen by Nephrology at the [facility], who manage this condition.  He is well controlled with current therapy.   Will defer to them for further changes to plan of care.

## 2022-11-03 NOTE — Progress Notes (Signed)
Established Patient Office Visit  Subjective:  Patient ID: Xavier Hebert, male    DOB: [birth year]  Age: 77 y.o. MRN: [id number]  Chief Complaint  Patient presents with   Follow-up    Patient is here today for his 3 months follow up.  He has been feeling fairly well since last appointment.   He does not have additional concerns to discuss today.  Labs are due today. He needs refills.   I have reviewed his active problem list, medication list, allergies, notes from last encounter, and lab results for his appointment today.   No other concerns at this time.   Past Medical History:  Diagnosis Date   Age-related nuclear cataract of both eyes 01/21/2022   Anxiety    Arthritis    Chronic kidney disease, stage 3a (HCC) 08/16/2022   Aug 16, 2022 Entered By: Sharl Ma A Comment: b/l Cr ~2-2.2   Chronic pain syndrome    CKD (chronic kidney disease) stage 3, GFR 30-59 ml/min (HCC)    Depression    Diabetes mellitus without complication (HCC)    Type II   Episodic tension-type headache, not intractable 08/06/2022   Exposure to potentially hazardous substance 08/16/2022   Jul 05, 2022 Entered By: Derinda Late Comment: Entered automatically through TES Problem List documentation program   GERD (gastroesophageal reflux disease)    Headache    Migraine- headache   History of urinary stone 08/06/2022   Hyperlipidemia    Hypertension    Kidney stones    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, unspecified laterality 11/27/2019   Peptic ulcer    Sleep apnea    does not use cpap/ pt states he lost 60#   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) 07/17/2019   Status post laser cataract surgery of both eyes 05/10/2022   Unspecified glaucoma 08/06/2022    Past Surgical History:  Procedure Laterality Date   ANTERIOR CERVICAL DECOMP/DISCECTOMY FUSION  2009   CARPAL TUNNEL RELEASE Left 01/03/2018   Procedure: CARPAL TUNNEL RELEASE;   Surgeon: Kennedy Bucker, MD;  Location: [facility] ORS;  Service: Orthopedics;  Laterality: Left;   CARPOMETACARPAL (CMC) FUSION OF THUMB Left 08/10/2020   Procedure: CARPOMETACARPAL [facility]) FUSION OF THUMB;  Surgeon: Kennedy Bucker, MD;  Location: [facility] ORS;  Service: Orthopedics;  Laterality: Left;   COLONOSCOPY WITH PROPOFOL N/A 11/21/2017   Procedure: COLONOSCOPY WITH PROPOFOL;  Surgeon: Toledo, Boykin Nearing, MD;  Location: [facility] ENDOSCOPY;  Service: Gastroenterology;  Laterality: N/A;   CYSTOSCOPY     x3- 2 times for stone removal    ESOPHAGOGASTRODUODENOSCOPY N/A 10/14/2017   Procedure: ESOPHAGOGASTRODUODENOSCOPY (EGD);  Surgeon: Toney Reil, MD;  Location: [facility] ENDOSCOPY;  Service: Gastroenterology;  Laterality: N/A;   ESOPHAGOGASTRODUODENOSCOPY (EGD) WITH PROPOFOL N/A 10/25/2017   Procedure: ESOPHAGOGASTRODUODENOSCOPY (EGD) WITH PROPOFOL;  Surgeon: Toney Reil, MD;  Location: [facility] ENDOSCOPY;  Service: Gastroenterology;  Laterality: N/A;   JOINT REPLACEMENT     bil. knees , shoulder rt, and neck    REVERSE SHOULDER ARTHROPLASTY Right 12/01/2014   REVERSE SHOULDER ARTHROPLASTY Right 12/01/2014   Procedure: REVERSE SHOULDER ARTHROPLASTY;  Surgeon: Cammy Copa, MD;  Location: [facility] OR;  Service: Orthopedics;  Laterality: Right;   SHOULDER ARTHROSCOPY W/ ROTATOR CUFF REPAIR Right 06/10/2014   "failed"   SHOULDER OPEN ROTATOR CUFF REPAIR Left ~ 2008   TOTAL KNEE ARTHROPLASTY Bilateral 2005-2008   "right-left"   TRIGGER FINGER RELEASE Right 2001   TRIGGER FINGER RELEASE Left 08/10/2020  Procedure: RELEASE TRIGGER FINGER/A-1 PULLEY;  Surgeon: Kennedy Bucker, MD;  Location: [facility] ORS;  Service: Orthopedics;  Laterality: Left;   URETERAL STENT PLACEMENT      Social History   Socioeconomic History   Marital status: Married    Spouse name: Not on file   Number of children: Not on file   Years of education: Not on file   Highest education level: Not on file  Occupational History   Not on  file  Tobacco Use   Smoking status: Never   Smokeless tobacco: Never  Vaping Use   Vaping status: Never Used  Substance and Sexual Activity   Alcohol use: No   Drug use: No   Sexual activity: Not Currently  Other Topics Concern   Not on file  Social History Narrative   Lives at home with his wife. Independent at baseline.   Social Determinants of Health   Financial Resource Strain: Not on file  Food Insecurity: Not on file  Transportation Needs: Not on file  Physical Activity: Not on file  Stress: Not on file  Social Connections: Not on file  Intimate Partner Violence: Not on file    Family History  Problem Relation Age of Onset   Dementia Mother    Alcohol abuse Father     Allergies  Allergen Reactions   Ivp Dye [Iodinated Contrast Media] Anaphylaxis and Other (See Comments)    Breathing difficulty and chest pain   Quinine Other (See Comments), Shortness Of Breath and Rash    Other reaction(s): SHORTNESS OF BREATH  Other reaction(s): Other (See Comments)  Other reaction(s): SHORTNESS OF BREATH  Other reaction(s): SHORTNESS OF BREATH  Other reaction(s): SHORTNESS OF BREATH   Amlodipine Swelling    Other reaction(s): Edema of lower extremity   Aspirin Other (See Comments)    Stomach pain   Gabapentin     Other reaction(s): Dizziness   Glipizide Other (See Comments)    Other reaction(s): Chest pain  Other reaction(s): Other (See Comments)  Other reaction(s): Chest pain  Other reaction(s): Chest pain   Hydrochlorothiazide Other (See Comments)    Other reaction(s): Impotence   Ibuprofen Other (See Comments)    Stomach pain  Other Reaction(s): ITCHING,WATERING EYES   Ioxaglate Other (See Comments)    Breathing difficulty and chest pain   Latex Itching   Nsaids     Kidneys   Omeprazole Other (See Comments)    Other reaction(s): FAILED THERAPY  Other Reaction(s): FAILED THERAPY   Pregabalin Other (See Comments)    Other reaction(s): Dizziness  Other  reaction(s): Dizziness  Other reaction(s): Dizziness  Other reaction(s): Dizziness  Other reaction(s): Dizziness   Doxycycline Anxiety   Pioglitazone Diarrhea and Other (See Comments)    Other reaction(s): Diarrhea, Abdominal pain  Other Reaction(s): Diarrhea, Abdominal pain    Review of Systems  All other systems reviewed and are negative.      Objective:   BP 100/70   Pulse 87   Ht 6' (1.829 m)   Wt 168 lb 3.2 oz (76.3 kg)   SpO2 98%   BMI 22.81 kg/m   Vitals:   11/03/22 1330  BP: 100/70  Pulse: 87  Height: 6' (1.829 m)  Weight: 168 lb 3.2 oz (76.3 kg)  SpO2: 98%  BMI (Calculated): 22.81    Physical Exam Vitals and nursing note reviewed.  Constitutional:      Appearance: Normal appearance. He is normal weight.  Eyes:     Pupils: Pupils are equal,  round, and reactive to light.  Cardiovascular:     Rate and Rhythm: Normal rate and regular rhythm.     Pulses: Normal pulses.     Heart sounds: Normal heart sounds.  Pulmonary:     Effort: Pulmonary effort is normal.     Breath sounds: Normal breath sounds.  Neurological:     General: No focal deficit present.     Mental Status: He is alert and oriented to person, place, and time. Mental status is at baseline.  Psychiatric:        Mood and Affect: Mood normal.        Behavior: Behavior normal.        Thought Content: Thought content normal.        Judgment: Judgment normal.      Results for orders placed or performed in visit on 11/03/22  POCT CBG (Fasting - Glucose)  Result Value Ref Range   Glucose Fasting, POC 120 (A) 70 - 99 mg/dL    Recent Results (from the past 2160 hour(s))  POCT CBG (Fasting - Glucose)     Status: Abnormal   Collection Time: 11/03/22  1:36 PM  Result Value Ref Range   Glucose Fasting, POC 120 (A) 70 - 99 mg/dL       Assessment & Plan:   Problem List Items Addressed This Visit       Active Problems   Hyperlipidemia, mixed    Checking labs today.  Continue current  therapy for lipid control. Will modify as needed based on labwork results.       Relevant Orders   Lipid panel   Chronic kidney disease, stage 4 (severe) (HCC)    Patient is seen by Nephrology at the [facility], who manage this condition.  He is well controlled with current therapy.   Will defer to them for further changes to plan of care.       Type 2 diabetes mellitus with kidney complication, with long-term current use of insulin (HCC) - Primary    Checking labs today. Will call pt. With results  Continue current diabetes POC, as patient has been well controlled on current regimen.  Will adjust meds if needed based on labs.       Relevant Orders   POCT CBG (Fasting - Glucose) (Completed)   CMP14+EGFR   Hemoglobin A1c   CBC with Differential/Platelet   Gout, unspecified    Checking labs today. Will call with results and adjust meds as needed based on those.  Continue current therapy.      Relevant Orders   CMP14+EGFR   CBC with Differential/Platelet   B12 deficiency due to diet    Checking labs today.  Will continue supplements as needed.       Relevant Orders   CMP14+EGFR   Vitamin B12   CBC with Differential/Platelet   Vitamin D deficiency, unspecified    Checking labs today.  Will continue supplements as needed.        Relevant Orders   VITAMIN D 25 Hydroxy (Vit-D Deficiency, Fractures)   CMP14+EGFR   CBC with Differential/Platelet   Other Visit Diagnoses     Hypothyroidism (acquired)       Relevant Orders   CMP14+EGFR   TSH   CBC with Differential/Platelet       Return in about 4 months (around 03/05/2023) for F/U.   Total time spent: 30 minutes  Miki Kins, FNP  11/03/2022   This document may have been prepared by  Conservation officer, historic buildings and as such may include unintentional dictation errors.

## 2022-11-03 NOTE — Assessment & Plan Note (Signed)
Checking labs today.  Continue current therapy for lipid control. Will modify as needed based on labwork results.  

## 2022-11-03 NOTE — Assessment & Plan Note (Signed)
Checking labs today. Will call pt. With results  Continue current diabetes POC, as patient has been well controlled on current regimen.  Will adjust meds if needed based on labs.  

## 2022-11-03 NOTE — Assessment & Plan Note (Signed)
Checking labs today. Will call with results and adjust meds as needed based on those.  Continue current therapy.

## 2022-11-03 NOTE — Assessment & Plan Note (Signed)
Checking labs today.  Will continue supplements as needed.  

## 2022-11-04 LAB — VITAMIN D 25 HYDROXY (VIT D DEFICIENCY, FRACTURES): Vit D, 25-Hydroxy: 59.1 ng/mL (ref 30.0–100.0)

## 2022-11-04 LAB — LIPID PANEL
Chol/HDL Ratio: 3.6 ratio (ref 0.0–5.0)
Cholesterol, Total: 163 mg/dL (ref 100–199)
HDL: 45 mg/dL (ref >39)
LDL Chol Calc (NIH): 91 mg/dL (ref 0–99)
Triglycerides: 154 mg/dL — ABNORMAL HIGH (ref 0–149)
VLDL Cholesterol Cal: 27 mg/dL (ref 5–40)

## 2022-11-04 LAB — HEMOGLOBIN A1C
Est. average glucose Bld gHb Est-mCnc: 137 mg/dL
Hgb A1c MFr Bld: 6.4 % — ABNORMAL HIGH (ref 4.8–5.6)

## 2022-11-04 LAB — CMP14+EGFR
ALT: 12 IU/L (ref 0–44)
AST: 19 IU/L (ref 0–40)
Albumin: 4.2 g/dL (ref 3.8–4.8)
Alkaline Phosphatase: 131 IU/L — ABNORMAL HIGH (ref 44–121)
BUN/Creatinine Ratio: 11 (ref 10–24)
BUN: 28 mg/dL — ABNORMAL HIGH (ref 8–27)
Bilirubin Total: 0.3 mg/dL (ref 0.0–1.2)
CO2: 22 mmol/L (ref 20–29)
Calcium: 9.1 mg/dL (ref 8.6–10.2)
Chloride: 106 mmol/L (ref 96–106)
Creatinine, Ser: 2.48 mg/dL — ABNORMAL HIGH (ref 0.76–1.27)
Globulin, Total: 2.5 g/dL (ref 1.5–4.5)
Glucose: 102 mg/dL — ABNORMAL HIGH (ref 70–99)
Potassium: 4.4 mmol/L (ref 3.5–5.2)
Sodium: 140 mmol/L (ref 134–144)
Total Protein: 6.7 g/dL (ref 6.0–8.5)
eGFR: 26 mL/min/{1.73_m2} — ABNORMAL LOW (ref >59)

## 2022-11-04 LAB — TSH: TSH: 0.888 u[IU]/mL (ref 0.450–4.500)

## 2022-11-04 LAB — VITAMIN B12: Vitamin B-12: >2000 pg/mL — ABNORMAL HIGH (ref 232–1245)

## 2022-11-06 NOTE — Progress Notes (Signed)
Chief Complaint  Patient presents with   Diabetes    "I'm a Diabetic, here to get a nail trim."    SUBJECTIVE Patient with a history of diabetes mellitus presents to office today complaining of elongated, thickened nails that cause pain while ambulating in shoes.  Patient is unable to trim their own nails. Patient is here for further evaluation and treatment.  Past Medical History:  Diagnosis Date   Age-related nuclear cataract of both eyes 01/21/2022   Anxiety    Arthritis    Chronic kidney disease, stage 3a (HCC) 08/16/2022   Aug 16, 2022 Entered By: Sharl Ma A Comment: b/l Cr ~2-2.2   Chronic pain syndrome    CKD (chronic kidney disease) stage 3, GFR 30-59 ml/min (HCC)    Depression    Diabetes mellitus without complication (HCC)    Type II   Episodic tension-type headache, not intractable 08/06/2022   Exposure to potentially hazardous substance 08/16/2022   Jul 05, 2022 Entered By: Derinda Late Comment: Entered automatically through [facility] Problem List documentation program   GERD (gastroesophageal reflux disease)    Headache    Migraine- headache   History of urinary stone 08/06/2022   Hyperlipidemia    Hypertension    Kidney stones    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, unspecified laterality 11/27/2019   Peptic ulcer    Sleep apnea    does not use cpap/ pt states he lost 60#   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) 07/17/2019   Status post laser cataract surgery of both eyes 05/10/2022   Unspecified glaucoma 08/06/2022    Allergies  Allergen Reactions   Ivp Dye [Iodinated Contrast Media] Anaphylaxis and Other (See Comments)    Breathing difficulty and chest pain   Quinine Other (See Comments), Shortness Of Breath and Rash    Other reaction(s): SHORTNESS OF BREATH  Other reaction(s): Other (See Comments)  Other reaction(s): SHORTNESS OF BREATH  Other reaction(s): SHORTNESS OF BREATH  Other reaction(s):  SHORTNESS OF BREATH   Amlodipine Swelling    Other reaction(s): Edema of lower extremity   Aspirin Other (See Comments)    Stomach pain   Gabapentin     Other reaction(s): Dizziness   Glipizide Other (See Comments)    Other reaction(s): Chest pain  Other reaction(s): Other (See Comments)  Other reaction(s): Chest pain  Other reaction(s): Chest pain   Hydrochlorothiazide Other (See Comments)    Other reaction(s): Impotence   Ibuprofen Other (See Comments)    Stomach pain  Other Reaction(s): ITCHING,WATERING EYES   Ioxaglate Other (See Comments)    Breathing difficulty and chest pain   Latex Itching   Nsaids     Kidneys   Omeprazole Other (See Comments)    Other reaction(s): FAILED THERAPY  Other Reaction(s): FAILED THERAPY   Pregabalin Other (See Comments)    Other reaction(s): Dizziness  Other reaction(s): Dizziness  Other reaction(s): Dizziness  Other reaction(s): Dizziness  Other reaction(s): Dizziness   Doxycycline Anxiety   Pioglitazone Diarrhea and Other (See Comments)    Other reaction(s): Diarrhea, Abdominal pain  Other Reaction(s): Diarrhea, Abdominal pain     OBJECTIVE General Patient is awake, alert, and oriented x 3 and in no acute distress. Derm Skin is dry and supple bilateral. Negative open lesions or macerations. Remaining integument unremarkable. Nails are tender, long, thickened and dystrophic with subungual debris, consistent with onychomycosis, 1-5 bilateral. No signs of infection noted. Vasc  DP and PT pedal pulses  palpable bilaterally. Temperature gradient within normal limits.  Neuro Epicritic and protective threshold sensation diminished bilaterally.  Musculoskeletal Exam No symptomatic pedal deformities noted bilateral. Muscular strength within normal limits.  ASSESSMENT 1. Diabetes Mellitus w/ peripheral neuropathy 2.  Pain due to onychomycosis of toenails bilateral  PLAN OF CARE 1. Patient evaluated today. 2. Instructed to maintain  good pedal hygiene and foot care. Stressed importance of controlling blood sugar.  3. Mechanical debridement of nails 1-5 bilaterally performed using a nail nipper. Filed with dremel without incident.  4. Return to clinic in 3 mos.     Felecia Shelling, DPM [facility]  Dr. Felecia Shelling, DPM    [address] [place], [place] [address]                Office [phone]  Fax [phone]

## 2023-02-06 ENCOUNTER — Ambulatory Visit (INDEPENDENT_AMBULATORY_CARE_PROVIDER_SITE_OTHER): Payer: Medicare Other | Admitting: Podiatry

## 2023-02-06 DIAGNOSIS — L03115 Cellulitis of right lower limb: Secondary | ICD-10-CM | POA: Diagnosis not present

## 2023-02-06 DIAGNOSIS — R600 Localized edema: Secondary | ICD-10-CM | POA: Diagnosis not present

## 2023-02-06 MED ORDER — DOXYCYCLINE HYCLATE 100 MG PO TABS: 100.0000 mg | ORAL_TABLET | Freq: Two times a day (BID) | ORAL | 0 refills | Status: DC

## 2023-02-06 NOTE — Progress Notes (Signed)
No chief complaint on file.   HPI: 77 y.o. male presenting today for a new complaint of pain and tenderness associated to the right lower extremity.  Patient has had redness with swelling to the right leg intermittently over the last 2 months.  He actually went to the [facility] emergency department where he was evaluated and oral antibiotics were prescribed.  He finishes those antibiotics today.  Currently he has not had any follow up outpatient with a physician.  He has noticed drainage coming from the back of his leg with significant redness and swelling.  He says that a few months ago he was diagnosed with DVT of the right lower extremity and currently on Eliquis.  Past Medical History:  Diagnosis Date   Age-related nuclear cataract of both eyes 01/21/2022   Anxiety    Arthritis    Chronic kidney disease, stage 3a (HCC) 08/16/2022   Aug 16, 2022 Entered By: Sharl Ma A Comment: b/l Cr ~2-2.2   Chronic pain syndrome    CKD (chronic kidney disease) stage 3, GFR 30-59 ml/min (HCC)    Depression    Diabetes mellitus without complication (HCC)    Type II   Episodic tension-type headache, not intractable 08/06/2022   Exposure to potentially hazardous substance 08/16/2022   Jul 05, 2022 Entered By: Derinda Late Comment: Entered automatically through [facility] Problem List documentation program   GERD (gastroesophageal reflux disease)    Headache    Migraine- headache   History of urinary stone 08/06/2022   Hyperlipidemia    Hypertension    Kidney stones    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, unspecified laterality 11/27/2019   Peptic ulcer    Sleep apnea    does not use cpap/ pt states he lost 60#   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) 07/17/2019   Status post laser cataract surgery of both eyes 05/10/2022   Unspecified glaucoma 08/06/2022    Past Surgical History:  Procedure Laterality Date   ANTERIOR CERVICAL DECOMP/DISCECTOMY  FUSION  2009   CARPAL TUNNEL RELEASE Left 01/03/2018   Procedure: CARPAL TUNNEL RELEASE;  Surgeon: Kennedy Bucker, MD;  Location: [facility] ORS;  Service: Orthopedics;  Laterality: Left;   CARPOMETACARPAL (CMC) FUSION OF THUMB Left 08/10/2020   Procedure: CARPOMETACARPAL [facility]) FUSION OF THUMB;  Surgeon: Kennedy Bucker, MD;  Location: [facility] ORS;  Service: Orthopedics;  Laterality: Left;   COLONOSCOPY WITH PROPOFOL N/A 11/21/2017   Procedure: COLONOSCOPY WITH PROPOFOL;  Surgeon: Toledo, Boykin Nearing, MD;  Location: [facility] ENDOSCOPY;  Service: Gastroenterology;  Laterality: N/A;   CYSTOSCOPY     x3- 2 times for stone removal    ESOPHAGOGASTRODUODENOSCOPY N/A 10/14/2017   Procedure: ESOPHAGOGASTRODUODENOSCOPY (EGD);  Surgeon: Toney Reil, MD;  Location: [facility] END[facility]COPY;  Service: Gastroenterology;  Laterality: N/A;   ESOPHAGOGASTRODUODENOSCOPY (EGD) WITH PROPOFOL N/A 10/25/2017   Procedure: ESOPHAGOGASTRODUODENOSCOPY (EGD) WITH PROPOFOL;  Surgeon: Toney Reil, MD;  Location: [facility] ENDOSCOPY;  Service: Gastroenterology;  Laterality: N/A;   JOINT REPLACEMENT     bil. knees , shoulder rt, and neck    REVERSE SHOULDER ARTHROPLASTY Right 12/01/2014   REVERSE SHOULDER ARTHROPLASTY Right 12/01/2014   Procedure: REVERSE SHOULDER ARTHROPLASTY;  Surgeon: Cammy Copa, MD;  Location: [facility] OR;  Service: Orthopedics;  Laterality: Right;   SHOULDER ARTHROSCOPY W/ ROTATOR CUFF REPAIR Right 06/10/2014   "failed"   SHOULDER OPEN ROTATOR CUFF REPAIR Left ~ 2008   TOTAL KNEE ARTHROPLASTY Bilateral 2005-2008   "right-left"  TRIGGER FINGER RELEASE Right 2001   TRIGGER FINGER RELEASE Left 08/10/2020   Procedure: RELEASE TRIGGER FINGER/A-1 PULLEY;  Surgeon: Kennedy Bucker, MD;  Location: [facility] ORS;  Service: Orthopedics;  Laterality: Left;   URETERAL STENT PLACEMENT      Allergies  Allergen Reactions   Ivp Dye [Iodinated Contrast Media] Anaphylaxis and Other (See Comments)    Breathing difficulty and chest pain    Quinine Other (See Comments), Shortness Of Breath and Rash    Other reaction(s): SHORTNESS OF BREATH  Other reaction(s): Other (See Comments)  Other reaction(s): SHORTNESS OF BREATH  Other reaction(s): SHORTNESS OF BREATH  Other reaction(s): SHORTNESS OF BREATH   Amlodipine Swelling    Other reaction(s): Edema of lower extremity   Aspirin Other (See Comments)    Stomach pain   Gabapentin     Other reaction(s): Dizziness   Glipizide Other (See Comments)    Other reaction(s): Chest pain  Other reaction(s): Other (See Comments)  Other reaction(s): Chest pain  Other reaction(s): Chest pain   Hydrochlorothiazide Other (See Comments)    Other reaction(s): Impotence   Ibuprofen Other (See Comments)    Stomach pain  Other Reaction(s): ITCHING,WATERING EYES   Ioxaglate Other (See Comments)    Breathing difficulty and chest pain   Latex Itching   Nsaids     Kidneys   Omeprazole Other (See Comments)    Other reaction(s): FAILED THERAPY  Other Reaction(s): FAILED THERAPY   Pregabalin Other (See Comments)    Other reaction(s): Dizziness  Other reaction(s): Dizziness  Other reaction(s): Dizziness  Other reaction(s): Dizziness  Other reaction(s): Dizziness   Doxycycline Anxiety   Pioglitazone Diarrhea and Other (See Comments)    Other reaction(s): Diarrhea, Abdominal pain  Other Reaction(s): Diarrhea, Abdominal pain    RT leg 02/06/2023  Physical Exam: General: The patient is alert and oriented x3 in no acute distress.  Dermatology: 3 small open wounds noted to the posterior lateral aspect of the right leg.  Serous drainage noted.  Associated edema with erythema also noted throughout the right leg consistent with cellulitis  Vascular: Heavy edema noted to the right lower extremity with associated erythema consistent with cellulitis  Neurological: Grossly intact via light touch  Musculoskeletal Exam: No pedal deformities noted.  No crepitus to the leg.  Assessment/Plan of  Care: 1.  Cellulitis RLE 2.  H/o DVT RLE 3.  Ulcers RT leg  -Patient evaluated -Multilayer Unna boot compression wrap was applied to the right lower extremity to address the heavy edema to the right leg -Prescription for doxycycline 100 mg twice daily #20.  Patient does have allergic reaction with anxiety which was discussed but he is willing to take the medication -Return to clinic 1 week      Felecia Shelling, DPM [facility]  Dr. Felecia Shelling, DPM    [address] [place], [place] [address]                Office [phone]  Fax [phone]

## 2023-02-09 ENCOUNTER — Other Ambulatory Visit: Payer: Self-pay | Admitting: Family

## 2023-02-13 ENCOUNTER — Ambulatory Visit (INDEPENDENT_AMBULATORY_CARE_PROVIDER_SITE_OTHER): Payer: Medicare Other | Admitting: Podiatry

## 2023-02-13 ENCOUNTER — Encounter: Payer: Self-pay | Admitting: Podiatry

## 2023-02-13 VITALS — Ht 72.0 in | Wt 168.0 lb

## 2023-02-13 DIAGNOSIS — R600 Localized edema: Secondary | ICD-10-CM

## 2023-02-13 DIAGNOSIS — L03115 Cellulitis of right lower limb: Secondary | ICD-10-CM | POA: Diagnosis not present

## 2023-02-13 NOTE — Progress Notes (Signed)
Chief Complaint  Patient presents with   Wound Check     Patient is here for  F/UCellulitis RLE,  H/o DVT RLE, Ulcers RT leg     HPI: 77 y.o. male presenting today for follow-up evaluation of heavy edema with cellulitis RLE.  Patient has no significant improvement since last visit.  He is still taking the oral doxycycline as prescribed.  He left the [employer] on for about 3 days.    Brief history: Patient has had redness with swelling to the right leg intermittently over the last 2 months.  He actually went to the [facility] emergency department where he was evaluated and oral antibiotics were prescribed.  He finishes those antibiotics today.  Currently he has not had any follow up outpatient with a physician.  He has noticed drainage coming from the back of his leg with significant redness and swelling.  He says that a few months ago he was diagnosed with DVT of the right lower extremity and currently on Eliquis.  Past Medical History:  Diagnosis Date   Age-related nuclear cataract of both eyes 01/21/2022   Anxiety    Arthritis    Chronic kidney disease, stage 3a (HCC) 08/16/2022   Aug 16, 2022 Entered By: Sharl Ma A Comment: b/l Cr ~2-2.2   Chronic pain syndrome    CKD (chronic kidney disease) stage 3, GFR 30-59 ml/min (HCC)    Depression    Diabetes mellitus without complication (HCC)    Type II   Episodic tension-type headache, not intractable 08/06/2022   Exposure to potentially hazardous substance 08/16/2022   Jul 05, 2022 Entered By: Derinda Late Comment: Entered automatically through [facility] Problem List documentation program   GERD (gastroesophageal reflux disease)    Headache    Migraine- headache   History of urinary stone 08/06/2022   Hyperlipidemia    Hypertension    Kidney stones    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, unspecified laterality 11/27/2019   Peptic ulcer    Sleep apnea    does not use cpap/ pt states he lost 60#   Stage 3 chronic  kidney disease due to type 2 diabetes mellitus (HCC) 07/17/2019   Status post laser cataract surgery of both eyes 05/10/2022   Unspecified glaucoma 08/06/2022    Past Surgical History:  Procedure Laterality Date   ANTERIOR CERVICAL DECOMP/DISCECTOMY FUSION  2009   CARPAL TUNNEL RELEASE Left 01/03/2018   Procedure: CARPAL TUNNEL RELEASE;  Surgeon: Kennedy Bucker, MD;  Location: [facility] ORS;  Service: Orthopedics;  Laterality: Left;   CARPOMETACARPAL (CMC) FUSION OF THUMB Left 08/10/2020   Procedure: CARPOMETACARPAL [facility]) FUSION OF THUMB;  Surgeon: Kennedy Bucker, MD;  Location: [facility] ORS;  Service: Orthopedics;  Laterality: Left;   COLONOSCOPY WITH PROPOFOL N/A 11/21/2017   Procedure: COLONOSCOPY WITH PROPOFOL;  Surgeon: Toledo, Boykin Nearing, MD;  Location: [facility] ENDOSCOPY;  Service: Gastroenterology;  Laterality: N/A;   CYSTOSCOPY     x3- 2 times for stone removal    ESOPHAGOGASTRODUODENOSCOPY N/A 10/14/2017   Procedure: ESOPHAGOGASTRODUODENOSCOPY (EGD);  Surgeon: Toney Reil, MD;  Location: [facility] ENDOSCOPY;  Service: Gastroenterology;  Laterality: N/A;   ESOPHAGOGASTRODUODENOSCOPY (EGD) WITH PROPOFOL N/A 10/25/2017   Procedure: ESOPHAGOGASTRODUODENOSCOPY (EGD) WITH PROPOFOL;  Surgeon: Toney Reil, MD;  Location: [facility] ENDOSCOPY;  Service: Gastroenterology;  Laterality: N/A;   JOINT REPLACEMENT     bil. knees , shoulder rt, and neck    REVERSE SHOULDER ARTHROPLASTY Right 12/01/2014   REVERSE SHOULDER ARTHROPLASTY Right 12/01/2014   Procedure:  REVERSE SHOULDER ARTHROPLASTY;  Surgeon: Cammy Copa, MD;  Location: [facility] OR;  Service: Orthopedics;  Laterality: Right;   SHOULDER ARTHROSCOPY W/ ROTATOR CUFF REPAIR Right 06/10/2014   "failed"   SHOULDER OPEN ROTATOR CUFF REPAIR Left ~ 2008   TOTAL KNEE ARTHROPLASTY Bilateral 2005-2008   "right-left"   TRIGGER FINGER RELEASE Right 2001   TRIGGER FINGER RELEASE Left 08/10/2020   Procedure: RELEASE TRIGGER FINGER/A-1 PULLEY;  Surgeon: Kennedy Bucker, MD;  Location: [facility] ORS;  Service: Orthopedics;  Laterality: Left;   URETERAL STENT PLACEMENT      Allergies  Allergen Reactions   Ivp Dye [Iodinated Contrast Media] Anaphylaxis and Other (See Comments)    Breathing difficulty and chest pain   Quinine Other (See Comments), Shortness Of Breath and Rash    Other reaction(s): SHORTNESS OF BREATH  Other reaction(s): Other (See Comments)  Other reaction(s): SHORTNESS OF BREATH  Other reaction(s): SHORTNESS OF BREATH  Other reaction(s): SHORTNESS OF BREATH   Amlodipine Swelling    Other reaction(s): Edema of lower extremity   Aspirin Other (See Comments)    Stomach pain   Gabapentin     Other reaction(s): Dizziness   Glipizide Other (See Comments)    Other reaction(s): Chest pain  Other reaction(s): Other (See Comments)  Other reaction(s): Chest pain  Other reaction(s): Chest pain   Hydrochlorothiazide Other (See Comments)    Other reaction(s): Impotence   Ibuprofen Other (See Comments)    Stomach pain  Other Reaction(s): ITCHING,WATERING EYES   Ioxaglate Other (See Comments)    Breathing difficulty and chest pain   Latex Itching   Nsaids     Kidneys   Omeprazole Other (See Comments)    Other reaction(s): FAILED THERAPY  Other Reaction(s): FAILED THERAPY   Pregabalin Other (See Comments)    Other reaction(s): Dizziness  Other reaction(s): Dizziness  Other reaction(s): Dizziness  Other reaction(s): Dizziness  Other reaction(s): Dizziness   Doxycycline Anxiety   Pioglitazone Diarrhea and Other (See Comments)    Other reaction(s): Diarrhea, Abdominal pain  Other Reaction(s): Diarrhea, Abdominal pain    RT leg 02/06/2023  Physical Exam: General: The patient is alert and oriented x3 in no acute distress.  Dermatology: 3 small open wounds noted to the posterior lateral aspect of the right leg.  No serous drainage noted today.    Vascular: Heavy edema noted to the right lower extremity with associated  erythema consistent with cellulitis has improved significantly.  Significant reduction and resolution of the erythema and cellulitis  Neurological: Grossly intact via light touch  Musculoskeletal Exam: No pedal deformities noted.  No crepitus to the leg.  Assessment/Plan of Care: 1.  Cellulitis RLE 2.  H/o DVT RLE 3.  Ulcers RT leg  -Patient evaluated.  Significant improvement since last visit - Another multilayer Unna boot compression wrap was applied to the right lower extremity to address the heavy edema to the right leg - Continue oral doxycycline 100 mg twice daily #20 until completed -Return to clinic 3 weeks      Felecia Shelling, DPM [facility]  Dr. Felecia Shelling, DPM    [address] [place], [place] [address]  Office [phone]  Fax [phone]

## 2023-02-27 ENCOUNTER — Other Ambulatory Visit: Payer: Self-pay | Admitting: Family

## 2023-03-06 ENCOUNTER — Encounter: Payer: Self-pay | Admitting: Podiatry

## 2023-03-06 ENCOUNTER — Ambulatory Visit (INDEPENDENT_AMBULATORY_CARE_PROVIDER_SITE_OTHER): Payer: Medicare Other | Admitting: Podiatry

## 2023-03-06 DIAGNOSIS — L03115 Cellulitis of right lower limb: Secondary | ICD-10-CM

## 2023-03-06 DIAGNOSIS — R600 Localized edema: Secondary | ICD-10-CM | POA: Diagnosis not present

## 2023-03-06 NOTE — Progress Notes (Signed)
Chief Complaint  Patient presents with   Wound Check    "It's better, since those unna boots.  I still have concern about the redness around the ankle, whether or not the cellulitis is trying to come back."    HPI: 77 y.o. male presenting today for follow-up evaluation of heavy edema with cellulitis RLE.  Patient continues to notice improvement.  He has completed the oral doxycycline as prescribed.  He left the Unna boot on for about 1 week last application.  He continues to have some edema to the extremity.  Brief history: Patient has had redness with swelling to the right leg intermittently over the last 2 months.  He actually went to the [facility] emergency department where he was evaluated and oral antibiotics were prescribed.  He finishes those antibiotics today.  Currently he has not had any follow up outpatient with a physician.  He has noticed drainage coming from the back of his leg with significant redness and swelling.  He says that a few months ago he was diagnosed with DVT of the right lower extremity and currently on Eliquis.  Past Medical History:  Diagnosis Date   Age-related nuclear cataract of both eyes 01/21/2022   Anxiety    Arthritis    Chronic kidney disease, stage 3a (HCC) 08/16/2022   Aug 16, 2022 Entered By: Sharl Ma A Comment: b/l Cr ~2-2.2   Chronic pain syndrome    CKD (chronic kidney disease) stage 3, GFR 30-59 ml/min (HCC)    Depression    Diabetes mellitus without complication (HCC)    Type II   Episodic tension-type headache, not intractable 08/06/2022   Exposure to potentially hazardous substance 08/16/2022   Jul 05, 2022 Entered By: Derinda Late Comment: Entered automatically through [facility] Problem List documentation program   GERD (gastroesophageal reflux disease)    Headache    Migraine- headache   History of urinary stone 08/06/2022   Hyperlipidemia    Hypertension    Kidney stones    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,  unspecified laterality 11/27/2019   Peptic ulcer    Sleep apnea    does not use cpap/ pt states he lost 60#   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) 07/17/2019   Status post laser cataract surgery of both eyes 05/10/2022   Unspecified glaucoma 08/06/2022    Past Surgical History:  Procedure Laterality Date   ANTERIOR CERVICAL DECOMP/DISCECTOMY FUSION  2009   CARPAL TUNNEL RELEASE Left 01/03/2018   Procedure: CARPAL TUNNEL RELEASE;  Surgeon: Kennedy Bucker, MD;  Location: [facility] ORS;  Service: Orthopedics;  Laterality: Left;   CARPOMETACARPAL (CMC) FUSION OF THUMB Left 08/10/2020   Procedure: CARPOMETACARPAL [facility]) FUSION OF THUMB;  Surgeon: Kennedy Bucker, MD;  Location: [facility] ORS;  Service: Orthopedics;  Laterality: Left;   COLONOSCOPY WITH PROPOFOL N/A 11/21/2017   Procedure: COLONOSCOPY WITH PROPOFOL;  Surgeon: Toledo, Boykin Nearing, MD;  Location: [facility] ENDOSCOPY;  Service: Gastroenterology;  Laterality: N/A;   CYSTOSCOPY     x3- 2 times for stone removal    ESOPHAGOGASTRODUODENOSCOPY N/A 10/14/2017   Procedure: ESOPHAGOGASTRODUODENOSCOPY (EGD);  Surgeon: Toney Reil, MD;  Location: [facility] ENDOSCOPY;  Service: Gastroenterology;  Laterality: N/A;   ESOPHAGOGASTRODUODENOSCOPY (EGD) WITH PROPOFOL N/A 10/25/2017   Procedure: ESOPHAGOGASTRODUODENOSCOPY (EGD) WITH PROPOFOL;  Surgeon: Toney Reil, MD;  Location: [facility] ENDOSCOPY;  Service: Gastroenterology;  Laterality: N/A;   JOINT REPLACEMENT     bil. knees , shoulder rt, and neck  REVERSE SHOULDER ARTHROPLASTY Right 12/01/2014   REVERSE SHOULDER ARTHROPLASTY Right 12/01/2014   Procedure: REVERSE SHOULDER ARTHROPLASTY;  Surgeon: Cammy Copa, MD;  Location: [facility] OR;  Service: Orthopedics;  Laterality: Right;   SHOULDER ARTHROSCOPY W/ ROTATOR CUFF REPAIR Right 06/10/2014   "failed"   SHOULDER OPEN ROTATOR CUFF REPAIR Left ~ 2008   TOTAL KNEE ARTHROPLASTY Bilateral 2005-2008   "right-left"   TRIGGER FINGER  RELEASE Right 2001   TRIGGER FINGER RELEASE Left 08/10/2020   Procedure: RELEASE TRIGGER FINGER/A-1 PULLEY;  Surgeon: Kennedy Bucker, MD;  Location: [facility] ORS;  Service: Orthopedics;  Laterality: Left;   URETERAL STENT PLACEMENT      Allergies  Allergen Reactions   Ivp Dye [Iodinated Contrast Media] Anaphylaxis and Other (See Comments)    Breathing difficulty and chest pain   Quinine Other (See Comments), Shortness Of Breath and Rash    Other reaction(s): SHORTNESS OF BREATH  Other reaction(s): Other (See Comments)  Other reaction(s): SHORTNESS OF BREATH  Other reaction(s): SHORTNESS OF BREATH  Other reaction(s): SHORTNESS OF BREATH   Amlodipine Swelling    Other reaction(s): Edema of lower extremity   Aspirin Other (See Comments)    Stomach pain   Gabapentin     Other reaction(s): Dizziness   Glipizide Other (See Comments)    Other reaction(s): Chest pain  Other reaction(s): Other (See Comments)  Other reaction(s): Chest pain  Other reaction(s): Chest pain   Hydrochlorothiazide Other (See Comments)    Other reaction(s): Impotence   Ibuprofen Other (See Comments)    Stomach pain  Other Reaction(s): ITCHING,WATERING EYES   Ioxaglate Other (See Comments)    Breathing difficulty and chest pain   Latex Itching   Nsaids     Kidneys   Omeprazole Other (See Comments)    Other reaction(s): FAILED THERAPY  Other Reaction(s): FAILED THERAPY   Pregabalin Other (See Comments)    Other reaction(s): Dizziness  Other reaction(s): Dizziness  Other reaction(s): Dizziness  Other reaction(s): Dizziness  Other reaction(s): Dizziness   Doxycycline Anxiety   Pioglitazone Diarrhea and Other (See Comments)    Other reaction(s): Diarrhea, Abdominal pain  Other Reaction(s): Diarrhea, Abdominal pain    RT leg 02/06/2023  Physical Exam: General: The patient is alert and oriented x3 in no acute distress.  Dermatology: The ulcers to the lateral aspect of the right leg appear to be  significantly improved.  Scant serous drainage noted.  There is only 1 remaining ulcer that continues to be open and it is very superficial with good potential for healing.  The other 2 ulcers are completely healed  Vascular: Heavy edema noted to the right lower extremity with associated erythema consistent with cellulitis has improved significantly.  Significant reduction and resolution of the erythema and cellulitis  Neurological: Grossly intact via light touch  Musculoskeletal Exam: No pedal deformities noted.  No crepitus to the leg.  Assessment/Plan of Care: 1.  Cellulitis RLE 2.  H/o DVT RLE 3.  Ulcers RT leg  -Patient evaluated.  Continued significant improvement since last visit - Another multilayer Unna boot compression wrap was applied to the right lower extremity to address the heavy edema to the right leg.  Leave clean dry and intact for 1 week as tolerated -Return to clinic 3 weeks      Felecia Shelling, DPM [facility]  Dr. Felecia Shelling, DPM    [address]  [place], [place] [address]                Office [phone]  Fax [phone]

## 2023-03-09 ENCOUNTER — Ambulatory Visit: Payer: Medicare Other | Admitting: Family

## 2023-03-27 ENCOUNTER — Other Ambulatory Visit: Payer: Self-pay | Admitting: Family

## 2023-03-30 ENCOUNTER — Ambulatory Visit (INDEPENDENT_AMBULATORY_CARE_PROVIDER_SITE_OTHER): Payer: Medicare Other | Admitting: Podiatry

## 2023-03-30 ENCOUNTER — Encounter: Payer: Self-pay | Admitting: Podiatry

## 2023-03-30 DIAGNOSIS — R600 Localized edema: Secondary | ICD-10-CM | POA: Diagnosis not present

## 2023-03-30 NOTE — Progress Notes (Signed)
 Chief Complaint  Patient presents with   Foot Swelling    It is improving.    HPI: 78 y.o. male presenting today for follow-up evaluation of heavy edema with cellulitis RLE.  Patient has noted significant improvement and resolution of the wounds but he says that he continues to have some edema to the extremity.  Brief history: Patient has had redness with swelling to the right leg intermittently over the last 2 months.  He actually went to the [facility] emergency department where he was evaluated and oral antibiotics were prescribed.  He finishes those antibiotics today.  Currently he has not had any follow up outpatient with a physician.  He has noticed drainage coming from the back of his leg with significant redness and swelling.  He says that a few months ago he was diagnosed with DVT of the right lower extremity and currently on Eliquis.  Past Medical History:  Diagnosis Date   Age-related nuclear cataract of both eyes 01/21/2022   Anxiety    Arthritis    Chronic kidney disease, stage 3a (HCC) 08/16/2022   Aug 16, 2022 Entered By: RONNETTE MAXWELL A Comment: b/l Cr ~2-2.2   Chronic pain syndrome    CKD (chronic kidney disease) stage 3, GFR 30-59 ml/min (HCC)    Depression    Diabetes mellitus without complication (HCC)    Type II   Episodic tension-type headache, not intractable 08/06/2022   Exposure to potentially hazardous substance 08/16/2022   Jul 05, 2022 Entered By: ROSANA DRAGON Comment: Entered automatically through [facility] Problem List documentation program   GERD (gastroesophageal reflux disease)    Headache    Migraine- headache   History of urinary stone 08/06/2022   Hyperlipidemia    Hypertension    Kidney stones    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, unspecified laterality 11/27/2019   Peptic ulcer    Sleep apnea    does not use cpap/ pt states he lost 60#   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) 07/17/2019   Status  post laser cataract surgery of both eyes 05/10/2022   Unspecified glaucoma 08/06/2022    Past Surgical History:  Procedure Laterality Date   ANTERIOR CERVICAL DECOMP/DISCECTOMY FUSION  2009   CARPAL TUNNEL RELEASE Left 01/03/2018   Procedure: CARPAL TUNNEL RELEASE;  Surgeon: Kathlynn Sharper, MD;  Location: [facility] ORS;  Service: Orthopedics;  Laterality: Left;   CARPOMETACARPAL (CMC) FUSION OF THUMB Left 08/10/2020   Procedure: CARPOMETACARPAL [facility]) FUSION OF THUMB;  Surgeon: Kathlynn Sharper, MD;  Location: [facility] ORS;  Service: Orthopedics;  Laterality: Left;   COLONOSCOPY WITH PROPOFOL  N/A 11/21/2017   Procedure: COLONOSCOPY WITH PROPOFOL ;  Surgeon: Toledo, Ladell POUR, MD;  Location: [facility] ENDOSCOPY;  Service: Gastroenterology;  Laterality: N/A;   CYSTOSCOPY     x3- 2 times for stone removal    ESOPHAGOGASTRODUODENOSCOPY N/A 10/14/2017   Procedure: ESOPHAGOGASTRODUODENOSCOPY (EGD);  Surgeon: Unk Corinn Skiff, MD;  Location: [facility] ENDOSCOPY;  Service: Gastroenterology;  Laterality: N/A;   ESOPHAGOGASTRODUODENOSCOPY (EGD) WITH PROPOFOL  N/A 10/25/2017   Procedure: ESOPHAGOGASTRODUODENOSCOPY (EGD) WITH PROPOFOL ;  Surgeon: Unk Corinn Skiff, MD;  Location: [facility] ENDOSCOPY;  Service: Gastroenterology;  Laterality: N/A;   JOINT REPLACEMENT     bil. knees , shoulder rt, and neck    REVERSE SHOULDER ARTHROPLASTY Right 12/01/2014   REVERSE SHOULDER ARTHROPLASTY Right 12/01/2014   Procedure: REVERSE SHOULDER ARTHROPLASTY;  Surgeon: Glendia Cordella Hutchinson, MD;  Location: [facility] OR;  Service: Orthopedics;  Laterality: Right;  SHOULDER ARTHROSCOPY W/ ROTATOR CUFF REPAIR Right 06/10/2014   failed   SHOULDER OPEN ROTATOR CUFF REPAIR Left ~ 2008   TOTAL KNEE ARTHROPLASTY Bilateral 2005-2008   right-left   TRIGGER FINGER RELEASE Right 2001   TRIGGER FINGER RELEASE Left 08/10/2020   Procedure: RELEASE TRIGGER FINGER/A-1 PULLEY;  Surgeon: Kathlynn Sharper, MD;  Location: [facility] ORS;  Service: Orthopedics;  Laterality: Left;    URETERAL STENT PLACEMENT      Allergies  Allergen Reactions   Ivp Dye [Iodinated Contrast Media] Anaphylaxis and Other (See Comments)    Breathing difficulty and chest pain   Quinine Other (See Comments), Shortness Of Breath and Rash    Other reaction(s): SHORTNESS OF BREATH  Other reaction(s): Other (See Comments)  Other reaction(s): SHORTNESS OF BREATH  Other reaction(s): SHORTNESS OF BREATH  Other reaction(s): SHORTNESS OF BREATH   Amlodipine  Swelling    Other reaction(s): Edema of lower extremity   Aspirin Other (See Comments)    Stomach pain   Gabapentin     Other reaction(s): Dizziness   Glipizide Other (See Comments)    Other reaction(s): Chest pain  Other reaction(s): Other (See Comments)  Other reaction(s): Chest pain  Other reaction(s): Chest pain   Hydrochlorothiazide Other (See Comments)    Other reaction(s): Impotence   Ibuprofen Other (See Comments)    Stomach pain  Other Reaction(s): ITCHING,WATERING EYES   Ioxaglate Other (See Comments)    Breathing difficulty and chest pain   Latex Itching   Nsaids     Kidneys   Omeprazole  Other (See Comments)    Other reaction(s): FAILED THERAPY  Other Reaction(s): FAILED THERAPY   Pregabalin Other (See Comments)    Other reaction(s): Dizziness  Other reaction(s): Dizziness  Other reaction(s): Dizziness  Other reaction(s): Dizziness  Other reaction(s): Dizziness   Doxycycline  Anxiety   Pioglitazone Diarrhea and Other (See Comments)    Other reaction(s): Diarrhea, Abdominal pain  Other Reaction(s): Diarrhea, Abdominal pain    RT leg 02/06/2023  Physical Exam: General: The patient is alert and oriented x3 in no acute distress.  Dermatology: Ulcer is healed.  No open wounds noted today  Vascular: Improved edema noted to the right lower extremity with associated erythema consistent with cellulitis has improved significantly.  Significant reduction and resolution of the erythema and  cellulitis  Neurological: Grossly intact via light touch  Musculoskeletal Exam: No pedal deformities noted.  No crepitus to the leg.  Assessment/Plan of Care: 1.  Cellulitis RLE 2.  H/o DVT RLE 3.  Ulcers RT leg  -Patient evaluated.   -The wounds to the leg have completely resolved and healed -Patient has a pair of compression hose at home.  Wear daily -Continue wearing diabetic shoes and insoles -Return to clinic as needed      Thresa EMERSON Sar, DPM [facility]  Dr. Thresa EMERSON Sar, DPM    [address] [place], [place] [address]                Office [phone]  Fax [phone]

## 2023-04-03 ENCOUNTER — Ambulatory Visit (INDEPENDENT_AMBULATORY_CARE_PROVIDER_SITE_OTHER): Payer: Medicare Other | Admitting: Family

## 2023-04-03 ENCOUNTER — Encounter: Payer: Self-pay | Admitting: Family

## 2023-04-03 VITALS — BP 122/64 | HR 83 | Ht 72.0 in | Wt 176.6 lb

## 2023-04-03 DIAGNOSIS — M1000 Idiopathic gout, unspecified site: Secondary | ICD-10-CM

## 2023-04-03 DIAGNOSIS — E039 Hypothyroidism, unspecified: Secondary | ICD-10-CM

## 2023-04-03 DIAGNOSIS — E559 Vitamin D deficiency, unspecified: Secondary | ICD-10-CM | POA: Diagnosis not present

## 2023-04-03 DIAGNOSIS — E538 Deficiency of other specified B group vitamins: Secondary | ICD-10-CM

## 2023-04-03 DIAGNOSIS — Z794 Long term (current) use of insulin: Secondary | ICD-10-CM

## 2023-04-03 DIAGNOSIS — I10 Essential (primary) hypertension: Secondary | ICD-10-CM

## 2023-04-03 DIAGNOSIS — E782 Mixed hyperlipidemia: Secondary | ICD-10-CM

## 2023-04-03 DIAGNOSIS — E1122 Type 2 diabetes mellitus with diabetic chronic kidney disease: Secondary | ICD-10-CM

## 2023-04-03 DIAGNOSIS — N184 Chronic kidney disease, stage 4 (severe): Secondary | ICD-10-CM

## 2023-04-03 DIAGNOSIS — G629 Polyneuropathy, unspecified: Secondary | ICD-10-CM

## 2023-04-03 MED ORDER — CYANOCOBALAMIN 1000 MCG/ML IJ SOLN
1000.0000 ug | Freq: Once | INTRAMUSCULAR | Status: AC
  Administered 2023-04-03: 1000 ug via INTRAMUSCULAR

## 2023-04-03 NOTE — Assessment & Plan Note (Signed)
 Checking labs today.  Continue current therapy for lipid control. Will modify as needed based on labwork results.

## 2023-04-03 NOTE — Assessment & Plan Note (Signed)
 Checking labs today.  Will continue supplements as needed.

## 2023-04-03 NOTE — Progress Notes (Signed)
 Established Patient Office Visit  Subjective:  Patient ID: Xavier Hebert, male    DOB: Aug 13, 1945  Age: 78 y.o. MRN: [id number]  Chief Complaint  Patient presents with   Follow-up    4 month follow up    Patient is here today for his 4 months follow up.  He has been feeling fairly well since last appointment.   He does have additional concerns to discuss today.   Has had a new issue in the last two weeks, pinky and ring finger on right hand, pins and needles, has now spread to the hand.  Pins and needles goes from the fingers to the elbow, then has pain after the pins and needles for a while.  He says that it's about a 6/10.  A friend told him it sounded like neuropathy.   Labs are due today. He needs refills.   I have reviewed his active problem list, medication list, allergies, notes from last encounter, lab results for his appointment today.       No other concerns at this time.   Past Medical History:  Diagnosis Date   Age-related nuclear cataract of both eyes 01/21/2022   Anxiety    Arthritis    Chronic kidney disease, stage 3a (HCC) 08/16/2022   Aug 16, 2022 Entered By: RONNETTE MAXWELL A Comment: b/l Cr ~2-2.2   Chronic pain syndrome    CKD (chronic kidney disease) stage 3, GFR 30-59 ml/min (HCC)    Depression    Diabetes mellitus without complication (HCC)    Type II   Episodic tension-type headache, not intractable 08/06/2022   Exposure to potentially hazardous substance 08/16/2022   Jul 05, 2022 Entered By: ROSANA DRAGON Comment: Entered automatically through [facility] Problem List documentation program   GERD (gastroesophageal reflux disease)    Headache    Migraine- headache   History of urinary stone 08/06/2022   Hyperlipidemia    Hypertension    Kidney stones    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, unspecified laterality 11/27/2019   Peptic ulcer    Sleep apnea    does not use cpap/ pt states he lost 60#   Stage 3 chronic kidney  disease due to type 2 diabetes mellitus (HCC) 07/17/2019   Status post laser cataract surgery of both eyes 05/10/2022   Unspecified glaucoma 08/06/2022    Past Surgical History:  Procedure Laterality Date   ANTERIOR CERVICAL DECOMP/DISCECTOMY FUSION  2009   CARPAL TUNNEL RELEASE Left 01/03/2018   Procedure: CARPAL TUNNEL RELEASE;  Surgeon: Kathlynn Sharper, MD;  Location: [facility] ORS;  Service: Orthopedics;  Laterality: Left;   CARPOMETACARPAL (CMC) FUSION OF THUMB Left 08/10/2020   Procedure: CARPOMETACARPAL [facility]) FUSION OF THUMB;  Surgeon: Kathlynn Sharper, MD;  Location: [facility] ORS;  Service: Orthopedics;  Laterality: Left;   COLONOSCOPY WITH PROPOFOL  N/A 11/21/2017   Procedure: COLONOSCOPY WITH PROPOFOL ;  Surgeon: Toledo, Ladell POUR, MD;  Location: [facility] ENDOSCOPY;  Service: Gastroenterology;  Laterality: N/A;   CYSTOSCOPY     x3- 2 times for stone removal    ESOPHAGOGASTRODUODENOSCOPY N/A 10/14/2017   Procedure: ESOPHAGOGASTRODUODENOSCOPY (EGD);  Surgeon: Unk Corinn Skiff, MD;  Location: [facility] ENDOSCOPY;  Service: Gastroenterology;  Laterality: N/A;   ESOPHAGOGASTRODUODENOSCOPY (EGD) WITH PROPOFOL  N/A 10/25/2017   Procedure: ESOPHAGOGASTRODUODENOSCOPY (EGD) WITH PROPOFOL ;  Surgeon: Unk Corinn Skiff, MD;  Location: [facility] ENDOSCOPY;  Service: Gastroenterology;  Laterality: N/A;   JOINT REPLACEMENT     bil. knees , shoulder rt, and neck    REVERSE SHOULDER ARTHROPLASTY Right  12/01/2014   REVERSE SHOULDER ARTHROPLASTY Right 12/01/2014   Procedure: REVERSE SHOULDER ARTHROPLASTY;  Surgeon: Glendia Cordella Hutchinson, MD;  Location: [facility] OR;  Service: Orthopedics;  Laterality: Right;   SHOULDER ARTHROSCOPY W/ ROTATOR CUFF REPAIR Right 06/10/2014   failed   SHOULDER OPEN ROTATOR CUFF REPAIR Left ~ 2008   TOTAL KNEE ARTHROPLASTY Bilateral 2005-2008   right-left   TRIGGER FINGER RELEASE Right 2001   TRIGGER FINGER RELEASE Left 08/10/2020   Procedure: RELEASE TRIGGER FINGER/A-1 PULLEY;  Surgeon: Kathlynn Sharper, MD;   Location: [facility] ORS;  Service: Orthopedics;  Laterality: Left;   URETERAL STENT PLACEMENT      Social History   Socioeconomic History   Marital status: Married    Spouse name: Not on file   Number of children: Not on file   Years of education: Not on file   Highest education level: Not on file  Occupational History   Not on file  Tobacco Use   Smoking status: Never   Smokeless tobacco: Never  Vaping Use   Vaping status: Never Used  Substance and Sexual Activity   Alcohol use: Never   Drug use: No   Sexual activity: Not Currently  Other Topics Concern   Not on file  Social History Narrative   Lives at home with his wife. Independent at baseline.   Social Drivers of Corporate Investment Banker Strain: Not on file  Food Insecurity: Not on file  Transportation Needs: Not on file  Physical Activity: Not on file  Stress: Not on file  Social Connections: Not on file  Intimate Partner Violence: Not on file    Family History  Problem Relation Age of Onset   Dementia Mother    Alcohol abuse Father     Allergies  Allergen Reactions   Ivp Dye [Iodinated Contrast Media] Anaphylaxis and Other (See Comments)    Breathing difficulty and chest pain   Quinine Other (See Comments), Shortness Of Breath and Rash    Other reaction(s): SHORTNESS OF BREATH  Other reaction(s): Other (See Comments)  Other reaction(s): SHORTNESS OF BREATH  Other reaction(s): SHORTNESS OF BREATH  Other reaction(s): SHORTNESS OF BREATH   Amlodipine  Swelling    Other reaction(s): Edema of lower extremity   Aspirin Other (See Comments)    Stomach pain   Gabapentin     Other reaction(s): Dizziness   Glipizide Other (See Comments)    Other reaction(s): Chest pain  Other reaction(s): Other (See Comments)  Other reaction(s): Chest pain  Other reaction(s): Chest pain   Hydrochlorothiazide Other (See Comments)    Other reaction(s): Impotence   Ibuprofen Other (See Comments)    Stomach pain  Other  Reaction(s): ITCHING,WATERING EYES   Ioxaglate Other (See Comments)    Breathing difficulty and chest pain   Latex Itching   Nsaids     Kidneys   Omeprazole  Other (See Comments)    Other reaction(s): FAILED THERAPY  Other Reaction(s): FAILED THERAPY   Pregabalin Other (See Comments)    Other reaction(s): Dizziness  Other reaction(s): Dizziness  Other reaction(s): Dizziness  Other reaction(s): Dizziness  Other reaction(s): Dizziness   Doxycycline  Anxiety   Pioglitazone Diarrhea and Other (See Comments)    Other reaction(s): Diarrhea, Abdominal pain  Other Reaction(s): Diarrhea, Abdominal pain    Review of Systems  Neurological:  Positive for tingling.  All other systems reviewed and are negative.      Objective:   BP 122/64   Pulse 83   Ht 6' (1.829 m)  Wt 176 lb 9.6 oz (80.1 kg)   SpO2 95%   BMI 23.95 kg/m   Vitals:   04/03/23 1315  BP: 122/64  Pulse: 83  Height: 6' (1.829 m)  Weight: 176 lb 9.6 oz (80.1 kg)  SpO2: 95%  BMI (Calculated): 23.95    Physical Exam Vitals and nursing note reviewed.  Constitutional:      Appearance: Normal appearance. He is normal weight.  Eyes:     Pupils: Pupils are equal, round, and reactive to light.  Cardiovascular:     Rate and Rhythm: Normal rate and regular rhythm.     Pulses: Normal pulses.     Heart sounds: Normal heart sounds.  Pulmonary:     Effort: Pulmonary effort is normal.     Breath sounds: Normal breath sounds.  Neurological:     Mental Status: He is alert.     Sensory: Sensory deficit (bilateral lower extremities.) present.  Psychiatric:        Mood and Affect: Mood normal.        Behavior: Behavior normal.        Thought Content: Thought content normal.        Judgment: Judgment normal.      No results found for any visits on 04/03/23.  No results found for this or any previous visit (from the past 2160 hours).     Assessment & Plan:   Problem List Items Addressed This Visit        Endocrine   Type 2 diabetes mellitus with kidney complication, with long-term current use of insulin  (HCC) - Primary   Checking labs today. Will call pt. With results  Continue current diabetes POC, as patient has been well controlled on current regimen.  Will adjust meds if needed based on labs.        Relevant Orders   CMP14+EGFR   Hemoglobin A1c   CBC with Diff     Genitourinary   Chronic kidney disease, stage 4 (severe) (HCC)   Patient is seen by Nephrology, who manage this condition.  He is well controlled with current therapy.   Will defer to them for further changes to plan of care.       Relevant Orders   CMP14+EGFR   CBC with Diff     Other   Hyperlipidemia, mixed   Checking labs today.  Continue current therapy for lipid control. Will modify as needed based on labwork results.        Relevant Orders   Lipid panel   CMP14+EGFR   CBC with Diff   Gout, unspecified   Patient stable.  Well controlled with current therapy.   Continue current meds.        Relevant Orders   CMP14+EGFR   CBC with Diff   B12 deficiency due to diet   B12 injection given in office today.  Return for next injection per provider instructions.       Relevant Orders   CMP14+EGFR   Vitamin B12   CBC with Diff   Vitamin D  deficiency, unspecified   Checking labs today.  Will continue supplements as needed.        Relevant Orders   VITAMIN D  25 Hydroxy (Vit-D Deficiency, Fractures)   CMP14+EGFR   CBC with Diff   Other Visit Diagnoses       Hypothyroidism (acquired)       Relevant Orders   CMP14+EGFR   TSH   CBC with Diff     Primary  hypertension       Relevant Orders   CMP14+EGFR   CBC with Diff     Polyneuropathy       Checking labs today - will call with results.   Relevant Orders   CMP14+EGFR   Vitamin B12   CBC with Diff   Iron, TIBC and Ferritin Panel   Magnesium        Return in about 4 months (around 08/01/2023).   Total time spent: 20  minutes  ALAN CHRISTELLA ARRANT, FNP  04/03/2023   This document may have been prepared by [facility] Voice Recognition software and as such may include unintentional dictation errors.

## 2023-04-03 NOTE — Assessment & Plan Note (Signed)
 Patient is seen by Nephrology, who manage this condition.  He is well controlled with current therapy.   Will defer to them for further changes to plan of care.

## 2023-04-03 NOTE — Assessment & Plan Note (Signed)
 Patient stable.  Well controlled with current therapy.   Continue current meds.

## 2023-04-03 NOTE — Assessment & Plan Note (Signed)
 B12 injection given in office today.  Return for next injection per provider instructions.

## 2023-04-03 NOTE — Assessment & Plan Note (Signed)
 Checking labs today. Will call pt. With results  Continue current diabetes POC, as patient has been well controlled on current regimen.  Will adjust meds if needed based on labs.

## 2023-04-04 LAB — CBC WITH DIFFERENTIAL/PLATELET
Basophils Absolute: 0.1 10*3/uL (ref 0.0–0.2)
Basos: 1 %
EOS (ABSOLUTE): 0.4 10*3/uL (ref 0.0–0.4)
Eos: 7 %
Hematocrit: 43.9 % (ref 37.5–51.0)
Hemoglobin: 14.3 g/dL (ref 13.0–17.7)
Immature Grans (Abs): 0 10*3/uL (ref 0.0–0.1)
Immature Granulocytes: 0 %
Lymphocytes Absolute: 0.9 10*3/uL (ref 0.7–3.1)
Lymphs: 17 %
MCH: 31.6 pg (ref 26.6–33.0)
MCHC: 32.6 g/dL (ref 31.5–35.7)
MCV: 97 fL (ref 79–97)
Monocytes Absolute: 0.5 10*3/uL (ref 0.1–0.9)
Monocytes: 10 %
Neutrophils Absolute: 3.3 10*3/uL (ref 1.4–7.0)
Neutrophils: 65 %
Platelets: 146 10*3/uL — ABNORMAL LOW (ref 150–450)
RBC: 4.52 x10E6/uL (ref 4.14–5.80)
RDW: 12.9 % (ref 11.6–15.4)
WBC: 5.1 10*3/uL (ref 3.4–10.8)

## 2023-04-04 LAB — CMP14+EGFR
ALT: 16 [IU]/L (ref 0–44)
AST: 18 [IU]/L (ref 0–40)
Albumin: 4.4 g/dL (ref 3.8–4.8)
Alkaline Phosphatase: 100 [IU]/L (ref 44–121)
BUN/Creatinine Ratio: 12 (ref 10–24)
BUN: 28 mg/dL — ABNORMAL HIGH (ref 8–27)
Bilirubin Total: 0.3 mg/dL (ref 0.0–1.2)
CO2: 24 mmol/L (ref 20–29)
Calcium: 9 mg/dL (ref 8.6–10.2)
Chloride: 105 mmol/L (ref 96–106)
Creatinine, Ser: 2.32 mg/dL — ABNORMAL HIGH (ref 0.76–1.27)
Globulin, Total: 2.2 g/dL (ref 1.5–4.5)
Glucose: 107 mg/dL — ABNORMAL HIGH (ref 70–99)
Potassium: 5 mmol/L (ref 3.5–5.2)
Sodium: 143 mmol/L (ref 134–144)
Total Protein: 6.6 g/dL (ref 6.0–8.5)
eGFR: 29 mL/min/{1.73_m2} — ABNORMAL LOW (ref >59)

## 2023-04-04 LAB — MAGNESIUM: Magnesium: 2.2 mg/dL (ref 1.6–2.3)

## 2023-04-04 LAB — IRON,TIBC AND FERRITIN PANEL
Ferritin: 87 ng/mL (ref 30–400)
Iron Saturation: 39 % (ref 15–55)
Iron: 96 ug/dL (ref 38–169)
Total Iron Binding Capacity: 246 ug/dL — ABNORMAL LOW (ref 250–450)
UIBC: 150 ug/dL (ref 111–343)

## 2023-04-04 LAB — LIPID PANEL
Chol/HDL Ratio: 2.8 {ratio} (ref 0.0–5.0)
Cholesterol, Total: 149 mg/dL (ref 100–199)
HDL: 54 mg/dL (ref >39)
LDL Chol Calc (NIH): 76 mg/dL (ref 0–99)
Triglycerides: 106 mg/dL (ref 0–149)
VLDL Cholesterol Cal: 19 mg/dL (ref 5–40)

## 2023-04-04 LAB — VITAMIN D 25 HYDROXY (VIT D DEFICIENCY, FRACTURES): Vit D, 25-Hydroxy: 69.1 ng/mL (ref 30.0–100.0)

## 2023-04-04 LAB — HEMOGLOBIN A1C
Est. average glucose Bld gHb Est-mCnc: 131 mg/dL
Hgb A1c MFr Bld: 6.2 % — ABNORMAL HIGH (ref 4.8–5.6)

## 2023-04-04 LAB — VITAMIN B12: Vitamin B-12: 1277 pg/mL — ABNORMAL HIGH (ref 232–1245)

## 2023-04-04 LAB — TSH: TSH: 1.13 u[IU]/mL (ref 0.450–4.500)

## 2023-04-05 ENCOUNTER — Other Ambulatory Visit: Payer: Self-pay | Admitting: Family

## 2023-04-06 MED ORDER — TESTOSTERONE 20 % CREA: 1.0000 | TOPICAL_CREAM | Freq: Two times a day (BID) | 2 refills | Status: DC

## 2023-04-10 NOTE — Progress Notes (Signed)
Patient notified

## 2023-04-20 ENCOUNTER — Telehealth: Payer: Self-pay | Admitting: Family

## 2023-04-20 NOTE — Telephone Encounter (Signed)
Patient called in wanting to know if its okay for him to take a supplement called "Neurodrops by luv". It is a drop form. Please advise.

## 2023-04-30 ENCOUNTER — Ambulatory Visit (INDEPENDENT_AMBULATORY_CARE_PROVIDER_SITE_OTHER): Payer: Medicare Other | Admitting: Family

## 2023-04-30 DIAGNOSIS — E538 Deficiency of other specified B group vitamins: Secondary | ICD-10-CM

## 2023-04-30 MED ORDER — CYANOCOBALAMIN 1000 MCG/ML IJ SOLN
1000.0000 ug | Freq: Once | INTRAMUSCULAR | Status: AC
Start: 2023-04-30 — End: 2023-04-30
  Administered 2023-04-30: 1000 ug via INTRAMUSCULAR

## 2023-05-03 ENCOUNTER — Encounter: Payer: Self-pay | Admitting: Family

## 2023-05-03 NOTE — Progress Notes (Signed)
   CHIEF COMPLAINT  B12 Shot     REASON FOR VISIT  B12 Injection     ASSESSMENT  B12 Deficiency, Unspecified     PLAN  Diagnoses and all orders for this visit:  B12 deficiency due to diet -     cyanocobalamin (VITAMIN B12) injection 1,000 mcg     Pt. given B12 injection in clinic.  Return for next injection per provider instructions.   Total time spent: 5 minutes  Miki Kins, FNP  04/30/2023

## 2023-05-10 ENCOUNTER — Ambulatory Visit: Payer: Medicare Other | Admitting: Family

## 2023-05-11 ENCOUNTER — Ambulatory Visit (INDEPENDENT_AMBULATORY_CARE_PROVIDER_SITE_OTHER): Payer: Medicare Other | Admitting: Family

## 2023-05-11 ENCOUNTER — Encounter: Payer: Self-pay | Admitting: Family

## 2023-05-11 VITALS — BP 118/68 | HR 88 | Ht 72.0 in | Wt 179.2 lb

## 2023-05-11 DIAGNOSIS — R7303 Prediabetes: Secondary | ICD-10-CM | POA: Diagnosis not present

## 2023-05-11 DIAGNOSIS — R21 Rash and other nonspecific skin eruption: Secondary | ICD-10-CM | POA: Diagnosis not present

## 2023-05-11 DIAGNOSIS — N184 Chronic kidney disease, stage 4 (severe): Secondary | ICD-10-CM | POA: Diagnosis not present

## 2023-05-11 DIAGNOSIS — Z0130 Encounter for examination of blood pressure without abnormal findings: Secondary | ICD-10-CM | POA: Diagnosis not present

## 2023-05-13 ENCOUNTER — Encounter: Payer: Self-pay | Admitting: Family

## 2023-05-13 NOTE — Progress Notes (Signed)
 Acute Office Visit  Subjective:     Patient ID: Xavier Hebert, male    DOB: 09-04-1945, 78 y.o.   MRN: [id number]  Patient is in today for  Chief Complaint  Patient presents with   Acute Visit    Area on lower back itching, possible ringworm.    Rash This is a new problem. The current episode started 1 to 4 weeks ago. The affected locations include the back. The rash is characterized by redness. He was exposed to nothing. Past treatments include antihistamine and anti-itch cream. The treatment provided no relief. His past medical history is significant for eczema.     Review of Systems  Skin:  Positive for rash.  All other systems reviewed and are negative.       Objective:    BP 118/68   Pulse 88   Ht 6' (1.829 m)   Wt 179 lb 3.2 oz (81.3 kg)   SpO2 96%   BMI 24.30 kg/m   Physical Exam Vitals and nursing note reviewed.  Constitutional:      Appearance: Normal appearance. He is normal weight.  Eyes:     Pupils: Pupils are equal, round, and reactive to light.  Cardiovascular:     Rate and Rhythm: Normal rate and regular rhythm.     Pulses: Normal pulses.     Heart sounds: Normal heart sounds.  Pulmonary:     Effort: Pulmonary effort is normal.     Breath sounds: Normal breath sounds.  Skin:    Findings: Rash present. Rash is vesicular.       Neurological:     General: No focal deficit present.     Mental Status: He is alert.  Psychiatric:        Attention and Perception: Attention and perception normal.        Mood and Affect: Mood normal.        Speech: Speech normal.        Behavior: Behavior normal. Behavior is cooperative.        Thought Content: Thought content normal.        Cognition and Memory: Cognition and memory normal.        Judgment: Judgment normal.     No results found for any visits on 05/11/23.  Recent Results (from the past 2160 hours)  Lipid panel     Status: None   Collection Time: 04/03/23  2:06 PM  Result Value Ref Range    Cholesterol, Total 149 100 - 199 mg/dL   Triglycerides 409 0 - 149 mg/dL   HDL 54 >81 mg/dL   VLDL Cholesterol Cal 19 5 - 40 mg/dL   LDL Chol Calc (NIH) 76 0 - 99 mg/dL   Chol/HDL Ratio 2.8 0.0 - 5.0 ratio    Comment:                                   T. Chol/HDL Ratio                                             Men  Women                               1/2 Avg.Risk  3.4  3.3                                   Avg.Risk  5.0    4.4                                2X Avg.Risk  9.6    7.1                                3X Avg.Risk 23.4   11.0   VITAMIN D 25 Hydroxy (Vit-D Deficiency, Fractures)     Status: None   Collection Time: 04/03/23  2:06 PM  Result Value Ref Range   Vit D, 25-Hydroxy 69.1 30.0 - 100.0 ng/mL    Comment: Vitamin D deficiency has been defined by the Institute of Medicine and an Endocrine Society practice guideline as a level of serum 25-OH vitamin D less than 20 ng/mL (1,2). The Endocrine Society went on to further define vitamin D insufficiency as a level between 21 and 29 ng/mL (2). 1. IOM (Institute of Medicine). 2010. Dietary reference    intakes for calcium and D. [place]: The    [employer]. 2. Holick MF, Binkley [place], Bischoff-Ferrari HA, et al.    Evaluation, treatment, and prevention of vitamin D    deficiency: an Endocrine Society clinical practice    guideline. JCEM. 2011 Jul; 96(7):1911-30.   CMP14+EGFR     Status: Abnormal   Collection Time: 04/03/23  2:06 PM  Result Value Ref Range   Glucose 107 (H) 70 - 99 mg/dL   BUN 28 (H) 8 - 27 mg/dL   Creatinine, Ser 4.78 (H) 0.76 - 1.27 mg/dL   eGFR 29 (L) >29 FA/OZH/0.86   BUN/Creatinine Ratio 12 10 - 24   Sodium 143 134 - 144 mmol/L   Potassium 5.0 3.5 - 5.2 mmol/L   Chloride 105 96 - 106 mmol/L   CO2 24 20 - 29 mmol/L   Calcium 9.0 8.6 - 10.2 mg/dL   Total Protein 6.6 6.0 - 8.5 g/dL   Albumin 4.4 3.8 - 4.8 g/dL   Globulin, Total 2.2 1.5 - 4.5 g/dL   Bilirubin Total 0.3 0.0 -  1.2 mg/dL   Alkaline Phosphatase 100 44 - 121 IU/L   AST 18 0 - 40 IU/L   ALT 16 0 - 44 IU/L  TSH     Status: None   Collection Time: 04/03/23  2:06 PM  Result Value Ref Range   TSH 1.130 0.450 - 4.500 uIU/mL  Hemoglobin A1c     Status: Abnormal   Collection Time: 04/03/23  2:06 PM  Result Value Ref Range   Hgb A1c MFr Bld 6.2 (H) 4.8 - 5.6 %    Comment:          Prediabetes: 5.7 - 6.4          Diabetes: >6.4          Glycemic control for adults with diabetes: <7.0    Est. average glucose Bld gHb Est-mCnc 131 mg/dL  Vitamin V78     Status: Abnormal   Collection Time: 04/03/23  2:06 PM  Result Value Ref Range   Vitamin B-12 1,277 (H) 232 - 1,245 pg/mL  CBC with Diff     Status: Abnormal   Collection  Time: 04/03/23  2:06 PM  Result Value Ref Range   WBC 5.1 3.4 - 10.8 x10E3/uL   RBC 4.52 4.14 - 5.80 x10E6/uL   Hemoglobin 14.3 13.0 - 17.7 g/dL   Hematocrit 81.1 91.4 - 51.0 %   MCV 97 79 - 97 fL   MCH 31.6 26.6 - 33.0 pg   MCHC 32.6 31.5 - 35.7 g/dL   RDW 78.2 95.6 - 21.3 %   Platelets 146 (L) 150 - 450 x10E3/uL   Neutrophils 65 Not Estab. %   Lymphs 17 Not Estab. %   Monocytes 10 Not Estab. %   Eos 7 Not Estab. %   Basos 1 Not Estab. %   Neutrophils Absolute 3.3 1.4 - 7.0 x10E3/uL   Lymphocytes Absolute 0.9 0.7 - 3.1 x10E3/uL   Monocytes Absolute 0.5 0.1 - 0.9 x10E3/uL   EOS (ABSOLUTE) 0.4 0.0 - 0.4 x10E3/uL   Basophils Absolute 0.1 0.0 - 0.2 x10E3/uL   Immature Granulocytes 0 Not Estab. %   Immature Grans (Abs) 0.0 0.0 - 0.1 x10E3/uL  Iron, TIBC and Ferritin Panel     Status: Abnormal   Collection Time: 04/03/23  2:06 PM  Result Value Ref Range   Total Iron Binding Capacity 246 (L) 250 - 450 ug/dL   UIBC 086 578 - 469 ug/dL   Iron 96 38 - 629 ug/dL   Iron Saturation 39 15 - 55 %   Ferritin 87 30 - 400 ng/mL  Magnesium     Status: None   Collection Time: 04/03/23  2:06 PM  Result Value Ref Range   Magnesium 2.2 1.6 - 2.3 mg/dL    Allergies as of 08/15/4130        Reactions   Ivp Dye [iodinated Contrast Media] Anaphylaxis, Other (See Comments)   Breathing difficulty and chest pain   Quinine Other (See Comments), Shortness Of Breath, Rash   Other reaction(s): SHORTNESS OF BREATH Other reaction(s): Other (See Comments)  Other reaction(s): SHORTNESS OF BREATH  Other reaction(s): SHORTNESS OF BREATH  Other reaction(s): SHORTNESS OF BREATH   Amlodipine Swelling   Other reaction(s): Edema of lower extremity   Aspirin Other (See Comments)   Stomach pain   Gabapentin    Other reaction(s): Dizziness   Glipizide Other (See Comments)   Other reaction(s): Chest pain Other reaction(s): Other (See Comments)  Other reaction(s): Chest pain  Other reaction(s): Chest pain   Hydrochlorothiazide Other (See Comments)   Other reaction(s): Impotence   Ibuprofen Other (See Comments)   Stomach pain Other Reaction(s): ITCHING,WATERING EYES   Ioxaglate Other (See Comments)   Breathing difficulty and chest pain   Latex Itching   Nsaids    Kidneys   Omeprazole Other (See Comments)   Other reaction(s): FAILED THERAPY Other Reaction(s): FAILED THERAPY   Pregabalin Other (See Comments)   Other reaction(s): Dizziness Other reaction(s): Dizziness  Other reaction(s): Dizziness  Other reaction(s): Dizziness  Other reaction(s): Dizziness   Doxycycline Anxiety   Pioglitazone Diarrhea, Other (See Comments)   Other reaction(s): Diarrhea, Abdominal pain Other Reaction(s): Diarrhea, Abdominal pain        Medication List        Accurate as of May 11, 2023 11:59 PM. If you have any questions, ask your nurse or doctor.          allopurinol 100 MG tablet Commonly known as: ZYLOPRIM Take 100 mg by mouth daily.   butalbital-acetaminophen-caffeine 50-325-40 MG tablet Commonly known as: FIORICET Take 1-2 tablets by mouth See admin instructions.  Take 2 tablets by mouth at start of headache and take 1 additional tablet by mouth after 2 hours if needed - max  3 tablets daily   cholecalciferol 25 MCG (1000 UNIT) tablet Commonly known as: VITAMIN D3 Take 3,000 Units by mouth daily.   clobetasol cream 0.05 % Commonly known as: TEMOVATE Apply 1 application topically daily as needed (eczema). Apply to affected area(s) 2 to 3 times weekly as needed   Coconut Oil 1000 MG Caps Take 2,000 mg by mouth 2 (two) times daily.   colchicine 0.6 MG tablet TAKE 1 TABLET BY MOUTH DAILY   doxycycline 100 MG tablet Commonly known as: VIBRA-TABS Take 1 tablet (100 mg total) by mouth 2 (two) times daily.   ELIQUIS PO Take 5 mg by mouth 2 (two) times daily.   empagliflozin 25 MG Tabs tablet Commonly known as: JARDIANCE Take 12.5 mg by mouth daily.   flunisolide 25 MCG/ACT (0.025%) Soln Commonly known as: NASALIDE PLACE 1 SPRAY INTO EACH NOSTRIL TWICE DAILY   hydrOXYzine 50 MG tablet Commonly known as: ATARAX TAKE 1 TABLET BY MOUTH EVERY 4 TO 6 HOURS AS NEEDED   ketoconazole 2 % shampoo Commonly known as: NIZORAL Apply 1 application topically once a week.   L-Tryptophan 500 MG Caps Take 500 mg by mouth at bedtime.   Lantus SoloStar 100 UNIT/ML Solostar Pen Generic drug: insulin glargine Inject 25 Units into the skin at bedtime.   lidocaine 5 % Commonly known as: LIDODERM Place 1 patch onto the skin every 12 (twelve) hours. Remove & Discard patch within 12 hours or as directed by MD   lisinopril 40 MG tablet Commonly known as: ZESTRIL Take 40 mg by mouth daily.   magnesium oxide 400 MG tablet Commonly known as: MAG-OX Take 400 mg by mouth daily.   methocarbamol 750 MG tablet Commonly known as: ROBAXIN TAKE 1 TABLET BY MOUTH 4 TIMES DAILY   multivitamin tablet Take 1 tablet by mouth daily.   mupirocin ointment 2 % Commonly known as: Bactroban Apply 1 application topically 2 (two) times daily.   Nucynta ER 50 MG 12 hr tablet Generic drug: tapentadol Take 50 mg by mouth at bedtime.   Nucynta ER 100 MG 12 hr tablet Generic  drug: tapentadol Take 200 mg by mouth at bedtime.   Osteo Bi-Flex Adv Triple St Tabs Take 1 tablet by mouth in the morning and at bedtime.   OVER THE COUNTER MEDICATION Take 2 capsules by mouth in the morning and at bedtime. BIO Probiotic   oxyCODONE 15 MG immediate release tablet Commonly known as: ROXICODONE Take 15 mg by mouth every 6 (six) hours.   pantoprazole 40 MG tablet Commonly known as: PROTONIX Take 40 mg by mouth 2 (two) times daily.   predniSONE 10 MG tablet Commonly known as: DELTASONE Take 2 tablets (20 mg total) by mouth daily with breakfast.   PREGNENOLONE PO Take 25 ng by mouth daily.   rosuvastatin 20 MG tablet Commonly known as: CRESTOR Take 10 mg by mouth at bedtime.   Saw Palmetto 450 MG Caps Take 900 mg by mouth in the morning and at bedtime.   Semaglutide (1 MG/DOSE) 4 MG/3ML Sopn Inject 1 mg into the skin once a week.   sildenafil 100 MG tablet Commonly known as: VIAGRA Take 100 mg by mouth daily as needed (ED).   silver sulfADIAZINE 1 % cream Commonly known as: Silvadene Apply 1 application topically daily. What changed:  when to take this reasons to  take this   tamsulosin 0.4 MG Caps capsule Commonly known as: FLOMAX Take 0.4 mg by mouth.   Testosterone 20 % Crea Apply 1 Application topically 2 (two) times daily.            Assessment & Plan:   Problem List Items Addressed This Visit       Genitourinary   Chronic kidney disease, stage 4 (severe) (HCC)   Patient is seen by Nephrology, who manage this condition.  He is well controlled with current therapy.   Will defer to them for further changes to plan of care.       Other Visit Diagnoses       Rash and nonspecific skin eruption    -  Primary   given pt. 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  Suggested hydrocortisone mixed with antifungal (to provide coverage for ringworm if needed).        Return as previously scheduled.  Total time spent: 20  minutes  Miki Kins, FNP  05/11/2023   This document may have been prepared by [facility] Voice Recognition software and as such may include unintentional dictation errors.

## 2023-05-13 NOTE — Assessment & Plan Note (Signed)
 Patient is seen by Nephrology, who manage this condition.  He is well controlled with current therapy.   Will defer to them for further changes to plan of care.

## 2023-06-08 ENCOUNTER — Ambulatory Visit

## 2023-06-12 ENCOUNTER — Encounter: Payer: Self-pay | Admitting: Family

## 2023-06-12 ENCOUNTER — Ambulatory Visit (INDEPENDENT_AMBULATORY_CARE_PROVIDER_SITE_OTHER): Admitting: Family

## 2023-06-12 DIAGNOSIS — E538 Deficiency of other specified B group vitamins: Secondary | ICD-10-CM

## 2023-06-12 MED ORDER — CYANOCOBALAMIN 1000 MCG/ML IJ SOLN
1000.0000 ug | Freq: Once | INTRAMUSCULAR | Status: AC
  Administered 2023-06-12: 1000 ug via INTRAMUSCULAR

## 2023-06-12 NOTE — Progress Notes (Signed)
   CHIEF COMPLAINT  B12 Shot     REASON FOR VISIT  B12 Injection     ASSESSMENT  B12 Deficiency, Unspecified     PLAN  Diagnoses and all orders for this visit:  B12 deficiency due to diet -     cyanocobalamin (VITAMIN B12) injection 1,000 mcg     Pt. given B12 injection in clinic.  Return for next injection per provider instructions.   Total time spent: 5 minutes  Miki Kins, FNP  06/12/2023

## 2023-06-14 ENCOUNTER — Encounter: Payer: Self-pay | Admitting: Physician Assistant

## 2023-06-14 DIAGNOSIS — Z0189 Encounter for other specified special examinations: Secondary | ICD-10-CM

## 2023-06-14 DIAGNOSIS — R3989 Other symptoms and signs involving the genitourinary system: Secondary | ICD-10-CM

## 2023-06-15 ENCOUNTER — Encounter: Payer: Self-pay | Admitting: Physician Assistant

## 2023-06-15 DIAGNOSIS — R3989 Other symptoms and signs involving the genitourinary system: Secondary | ICD-10-CM

## 2023-06-25 ENCOUNTER — Other Ambulatory Visit: Payer: Self-pay | Admitting: Physician Assistant

## 2023-06-25 DIAGNOSIS — R3129 Other microscopic hematuria: Secondary | ICD-10-CM

## 2023-06-25 DIAGNOSIS — R3989 Other symptoms and signs involving the genitourinary system: Secondary | ICD-10-CM

## 2023-06-29 ENCOUNTER — Encounter: Attending: Physician Assistant | Admitting: Physician Assistant

## 2023-06-29 DIAGNOSIS — E11622 Type 2 diabetes mellitus with other skin ulcer: Secondary | ICD-10-CM | POA: Diagnosis present

## 2023-06-29 DIAGNOSIS — L97812 Non-pressure chronic ulcer of other part of right lower leg with fat layer exposed: Secondary | ICD-10-CM | POA: Insufficient documentation

## 2023-06-29 DIAGNOSIS — I2510 Atherosclerotic heart disease of native coronary artery without angina pectoris: Secondary | ICD-10-CM | POA: Insufficient documentation

## 2023-06-29 DIAGNOSIS — L97822 Non-pressure chronic ulcer of other part of left lower leg with fat layer exposed: Secondary | ICD-10-CM | POA: Insufficient documentation

## 2023-06-29 DIAGNOSIS — Z7901 Long term (current) use of anticoagulants: Secondary | ICD-10-CM | POA: Insufficient documentation

## 2023-06-29 DIAGNOSIS — N184 Chronic kidney disease, stage 4 (severe): Secondary | ICD-10-CM | POA: Insufficient documentation

## 2023-06-29 DIAGNOSIS — I129 Hypertensive chronic kidney disease with stage 1 through stage 4 chronic kidney disease, or unspecified chronic kidney disease: Secondary | ICD-10-CM | POA: Insufficient documentation

## 2023-06-29 DIAGNOSIS — I87333 Chronic venous hypertension (idiopathic) with ulcer and inflammation of bilateral lower extremity: Secondary | ICD-10-CM | POA: Insufficient documentation

## 2023-06-29 DIAGNOSIS — Z86718 Personal history of other venous thrombosis and embolism: Secondary | ICD-10-CM | POA: Insufficient documentation

## 2023-06-29 DIAGNOSIS — L03115 Cellulitis of right lower limb: Secondary | ICD-10-CM | POA: Insufficient documentation

## 2023-07-02 ENCOUNTER — Encounter

## 2023-07-02 DIAGNOSIS — E11622 Type 2 diabetes mellitus with other skin ulcer: Secondary | ICD-10-CM | POA: Diagnosis not present

## 2023-07-03 LAB — MICROALBUMIN / CREATININE URINE RATIO: Microalb Creat Ratio: 259

## 2023-07-04 ENCOUNTER — Encounter: Admitting: Physician Assistant

## 2023-07-04 ENCOUNTER — Other Ambulatory Visit: Payer: Self-pay | Admitting: Orthopedic Surgery

## 2023-07-04 DIAGNOSIS — E11622 Type 2 diabetes mellitus with other skin ulcer: Secondary | ICD-10-CM | POA: Diagnosis not present

## 2023-07-06 ENCOUNTER — Encounter

## 2023-07-06 DIAGNOSIS — E11622 Type 2 diabetes mellitus with other skin ulcer: Secondary | ICD-10-CM | POA: Diagnosis not present

## 2023-07-09 ENCOUNTER — Encounter: Payer: Self-pay | Admitting: Orthopedic Surgery

## 2023-07-10 ENCOUNTER — Encounter: Payer: Self-pay | Admitting: Orthopedic Surgery

## 2023-07-10 NOTE — Anesthesia Preprocedure Evaluation (Addendum)
 Anesthesia Evaluation  Patient identified by MRN, date of birth, ID band Patient awake    Reviewed: Allergy & Precautions, H&P , NPO status , Patient's Chart, lab work & pertinent test results  Airway Mallampati: II  TM Distance: >3 FB Neck ROM: Full    Dental no notable dental hx. (+) Partial Upper   Pulmonary neg pulmonary ROS, sleep apnea  States no longer has sleep apnea since lost a lot of weight.  Breathing well.   Pulmonary exam normal breath sounds clear to auscultation       Cardiovascular hypertension, + CAD  Normal cardiovascular exam Rhythm:Regular Rate:Normal     Neuro/Psych  Headaches PSYCHIATRIC DISORDERS Anxiety Depression     Neuromuscular disease negative neurological ROS  negative psych ROS   GI/Hepatic negative GI ROS, Neg liver ROS, hiatal hernia, PUD,GERD  ,,  Endo/Other  diabetes    Renal/GU Renal diseasenegative Renal ROS  negative genitourinary   Musculoskeletal negative musculoskeletal ROS (+) Arthritis ,    Abdominal   Peds negative pediatric ROS (+)  Hematology negative hematology ROS (+)   Anesthesia Other Findings   Hypertension  Headache Diabetes mellitus without complication Anxiety Depression  GERD (gastroesophageal reflux disease) Arthritis  Peptic ulcer Chronic pain syndrome  Kidney stones Hyperlipidemia  CKD (chronic kidney disease) stage 3, GFR 30-59 ml/min  Sleep apnea  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 2 diabetes mellitus  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  Note from Dr. Mozell Arias that patient is on eliquis due to DVT, and will continue Eliquis. Supraclavicular block is non-compressible, so will not place supraclavicular block due to risk of bleed. May consider a superficial block, will discuss w/surgeon   Reproductive/Obstetrics negative OB ROS                             Anesthesia Physical Anesthesia Plan  ASA: 3  Anesthesia Plan: General ETT   Post-op Pain Management: Dilaudid  IV and Fentanyl  IV   Induction: Intravenous  PONV Risk Score and Plan:   Airway Management Planned: Oral ETT  Additional Equipment:   Intra-op Plan:   Post-operative Plan: Extubation in OR  Informed Consent: I have reviewed the patients History and Physical, chart, labs and discussed the procedure including the risks, benefits and alternatives for the proposed anesthesia with the patient or authorized representative who has indicated his/her understanding and acceptance.     Dental Advisory Given  Plan Discussed with: Anesthesiologist, CRNA and Surgeon  Anesthesia Plan Comments: (Patient consented for risks of anesthesia including but not limited to:  - adverse reactions to medications - damage to eyes, teeth, lips or other oral mucosa - nerve damage due to positioning  - sore throat or hoarseness - Damage to heart, brain, nerves, lungs, other parts of body or loss of life  Patient voiced understanding and assent.)        Anesthesia Quick Evaluation

## 2023-07-11 ENCOUNTER — Encounter: Admitting: Physician Assistant

## 2023-07-11 DIAGNOSIS — E11622 Type 2 diabetes mellitus with other skin ulcer: Secondary | ICD-10-CM | POA: Diagnosis not present

## 2023-07-13 ENCOUNTER — Ambulatory Visit

## 2023-07-16 ENCOUNTER — Encounter: Admitting: Physician Assistant

## 2023-07-16 DIAGNOSIS — E11622 Type 2 diabetes mellitus with other skin ulcer: Secondary | ICD-10-CM | POA: Diagnosis not present

## 2023-07-17 ENCOUNTER — Other Ambulatory Visit: Payer: Self-pay

## 2023-07-17 ENCOUNTER — Ambulatory Visit: Payer: Self-pay | Admitting: Anesthesiology

## 2023-07-17 ENCOUNTER — Encounter: Payer: Self-pay | Admitting: Orthopedic Surgery

## 2023-07-17 ENCOUNTER — Ambulatory Visit
Admission: RE | Admit: 2023-07-17 | Discharge: 2023-07-17 | Disposition: A | Discharge status: Home / Self Care | Attending: Orthopedic Surgery | Admitting: Orthopedic Surgery

## 2023-07-17 ENCOUNTER — Encounter
Admission: RE | Disposition: A | Discharge status: Home / Self Care | Payer: Self-pay | Source: Home / Self Care | Attending: Orthopedic Surgery

## 2023-07-17 DIAGNOSIS — Z79890 Hormone replacement therapy (postmenopausal): Secondary | ICD-10-CM | POA: Insufficient documentation

## 2023-07-17 DIAGNOSIS — M1990 Unspecified osteoarthritis, unspecified site: Secondary | ICD-10-CM | POA: Insufficient documentation

## 2023-07-17 DIAGNOSIS — K219 Gastro-esophageal reflux disease without esophagitis: Secondary | ICD-10-CM | POA: Diagnosis not present

## 2023-07-17 DIAGNOSIS — N183 Chronic kidney disease, stage 3 unspecified: Secondary | ICD-10-CM | POA: Insufficient documentation

## 2023-07-17 DIAGNOSIS — I2510 Atherosclerotic heart disease of native coronary artery without angina pectoris: Secondary | ICD-10-CM | POA: Diagnosis not present

## 2023-07-17 DIAGNOSIS — I129 Hypertensive chronic kidney disease with stage 1 through stage 4 chronic kidney disease, or unspecified chronic kidney disease: Secondary | ICD-10-CM | POA: Insufficient documentation

## 2023-07-17 DIAGNOSIS — E1129 Type 2 diabetes mellitus with other diabetic kidney complication: Secondary | ICD-10-CM

## 2023-07-17 DIAGNOSIS — R519 Headache, unspecified: Secondary | ICD-10-CM | POA: Insufficient documentation

## 2023-07-17 DIAGNOSIS — M109 Gout, unspecified: Secondary | ICD-10-CM | POA: Diagnosis not present

## 2023-07-17 DIAGNOSIS — E1141 Type 2 diabetes mellitus with diabetic mononeuropathy: Secondary | ICD-10-CM | POA: Insufficient documentation

## 2023-07-17 DIAGNOSIS — G5601 Carpal tunnel syndrome, right upper limb: Secondary | ICD-10-CM | POA: Insufficient documentation

## 2023-07-17 DIAGNOSIS — Z79899 Other long term (current) drug therapy: Secondary | ICD-10-CM | POA: Diagnosis not present

## 2023-07-17 DIAGNOSIS — Z7901 Long term (current) use of anticoagulants: Secondary | ICD-10-CM | POA: Diagnosis not present

## 2023-07-17 DIAGNOSIS — K449 Diaphragmatic hernia without obstruction or gangrene: Secondary | ICD-10-CM | POA: Insufficient documentation

## 2023-07-17 DIAGNOSIS — K279 Peptic ulcer, site unspecified, unspecified as acute or chronic, without hemorrhage or perforation: Secondary | ICD-10-CM | POA: Diagnosis not present

## 2023-07-17 DIAGNOSIS — Z794 Long term (current) use of insulin: Secondary | ICD-10-CM | POA: Diagnosis not present

## 2023-07-17 DIAGNOSIS — M25541 Pain in joints of right hand: Secondary | ICD-10-CM | POA: Diagnosis present

## 2023-07-17 DIAGNOSIS — E782 Mixed hyperlipidemia: Secondary | ICD-10-CM | POA: Insufficient documentation

## 2023-07-17 DIAGNOSIS — F419 Anxiety disorder, unspecified: Secondary | ICD-10-CM | POA: Insufficient documentation

## 2023-07-17 DIAGNOSIS — E1122 Type 2 diabetes mellitus with diabetic chronic kidney disease: Secondary | ICD-10-CM | POA: Insufficient documentation

## 2023-07-17 DIAGNOSIS — Z86718 Personal history of other venous thrombosis and embolism: Secondary | ICD-10-CM | POA: Diagnosis not present

## 2023-07-17 DIAGNOSIS — Z7984 Long term (current) use of oral hypoglycemic drugs: Secondary | ICD-10-CM | POA: Insufficient documentation

## 2023-07-17 DIAGNOSIS — G5621 Lesion of ulnar nerve, right upper limb: Secondary | ICD-10-CM | POA: Diagnosis present

## 2023-07-17 DIAGNOSIS — F32A Depression, unspecified: Secondary | ICD-10-CM | POA: Insufficient documentation

## 2023-07-17 HISTORY — DX: Torticollis: M43.6

## 2023-07-17 HISTORY — DX: Diaphragmatic hernia without obstruction or gangrene: K44.9

## 2023-07-17 HISTORY — DX: Presence of external hearing-aid: Z97.4

## 2023-07-17 HISTORY — PX: CARPAL TUNNEL RELEASE: SHX101

## 2023-07-17 HISTORY — PX: ULNAR TUNNEL RELEASE: SHX820

## 2023-07-17 HISTORY — DX: Acute embolism and thrombosis of unspecified deep veins of unspecified lower extremity: I82.409

## 2023-07-17 LAB — GLUCOSE, CAPILLARY: Glucose-Capillary: 108 mg/dL — ABNORMAL HIGH (ref 70–99)

## 2023-07-17 SURGERY — CARPAL TUNNEL RELEASE
Anesthesia: General | Site: Hand | Laterality: Right

## 2023-07-17 MED ORDER — DEXAMETHASONE SODIUM PHOSPHATE 4 MG/ML IJ SOLN
INTRAMUSCULAR | Status: AC
  Filled 2023-07-17: qty 1

## 2023-07-17 MED ORDER — CEFAZOLIN SODIUM-DEXTROSE 2-4 GM/100ML-% IV SOLN
2.0000 g | INTRAVENOUS | Status: AC
  Administered 2023-07-17: 2 g via INTRAVENOUS

## 2023-07-17 MED ORDER — CEFAZOLIN SODIUM-DEXTROSE 2-3 GM-%(50ML) IV SOLR
INTRAVENOUS | Status: AC
  Filled 2023-07-17: qty 50

## 2023-07-17 MED ORDER — LIDOCAINE HCL (CARDIAC) PF 100 MG/5ML IV SOSY
PREFILLED_SYRINGE | INTRAVENOUS | Status: DC | PRN
  Administered 2023-07-17: 100 mg via INTRAVENOUS

## 2023-07-17 MED ORDER — SUCCINYLCHOLINE CHLORIDE 200 MG/10ML IV SOSY
PREFILLED_SYRINGE | INTRAVENOUS | Status: DC | PRN
  Administered 2023-07-17: 140 mg via INTRAVENOUS

## 2023-07-17 MED ORDER — FENTANYL CITRATE (PF) 100 MCG/2ML IJ SOLN
INTRAMUSCULAR | Status: AC
Start: 2023-07-17 — End: ?
  Filled 2023-07-17: qty 2

## 2023-07-17 MED ORDER — BUPIVACAINE HCL 0.5 % IJ SOLN
INTRAMUSCULAR | Status: DC | PRN
  Administered 2023-07-17 (×2): 10 mL

## 2023-07-17 MED ORDER — DEXAMETHASONE SODIUM PHOSPHATE 4 MG/ML IJ SOLN
INTRAMUSCULAR | Status: DC | PRN
  Administered 2023-07-17: 4 mg via INTRAVENOUS

## 2023-07-17 MED ORDER — HYDROMORPHONE HCL 1 MG/ML IJ SOLN
INTRAMUSCULAR | Status: AC
  Filled 2023-07-17: qty 0.5

## 2023-07-17 MED ORDER — ONDANSETRON HCL 4 MG/2ML IJ SOLN
INTRAMUSCULAR | Status: DC | PRN
  Administered 2023-07-17: 4 mg via INTRAVENOUS

## 2023-07-17 MED ORDER — ONDANSETRON HCL 4 MG/2ML IJ SOLN
INTRAMUSCULAR | Status: AC
  Filled 2023-07-17: qty 2

## 2023-07-17 MED ORDER — OXYCODONE HCL 5 MG PO TABS: 5.0000 mg | ORAL_TABLET | Freq: Four times a day (QID) | ORAL | 0 refills | Status: DC | PRN

## 2023-07-17 MED ORDER — 0.9 % SODIUM CHLORIDE (POUR BTL) OPTIME: TOPICAL | Status: DC | PRN

## 2023-07-17 MED ORDER — SODIUM CHLORIDE 0.9 % IV SOLN: INTRAVENOUS | Status: DC

## 2023-07-17 MED ORDER — PROPOFOL 10 MG/ML IV BOLUS
INTRAVENOUS | Status: DC | PRN
  Administered 2023-07-17: 200 mg via INTRAVENOUS

## 2023-07-17 MED ORDER — FENTANYL CITRATE (PF) 100 MCG/2ML IJ SOLN
INTRAMUSCULAR | Status: DC | PRN
  Administered 2023-07-17: 50 ug via INTRAVENOUS

## 2023-07-17 MED ORDER — FAMOTIDINE 200 MG/20ML IV SOLN
INTRAVENOUS | Status: AC
Start: 2023-07-17 — End: ?
  Filled 2023-07-17: qty 1

## 2023-07-17 MED ORDER — PROPOFOL 10 MG/ML IV BOLUS
INTRAVENOUS | Status: AC
  Filled 2023-07-17: qty 20

## 2023-07-17 MED ORDER — PHENYLEPHRINE HCL (PRESSORS) 10 MG/ML IV SOLN
INTRAVENOUS | Status: DC | PRN
  Administered 2023-07-17 (×4): 80 ug via INTRAVENOUS

## 2023-07-17 MED ORDER — PHENYLEPHRINE 80 MCG/ML (10ML) SYRINGE FOR IV PUSH (FOR BLOOD PRESSURE SUPPORT)
PREFILLED_SYRINGE | INTRAVENOUS | Status: AC
  Filled 2023-07-17: qty 10

## 2023-07-17 MED ORDER — HYDROMORPHONE HCL 1 MG/ML IJ SOLN
INTRAMUSCULAR | Status: DC | PRN
  Administered 2023-07-17: .5 mg via INTRAVENOUS

## 2023-07-17 SURGICAL SUPPLY — 16 items
BNDG ELASTIC 3X5.8 VLCR NS LF (GAUZE/BANDAGES/DRESSINGS) IMPLANT
BNDG ELASTIC 4X5.8 VLCR NS LF (GAUZE/BANDAGES/DRESSINGS) ×2 IMPLANT
CHLORAPREP W/TINT 26 (MISCELLANEOUS) ×2 IMPLANT
COVER LIGHT HANDLE UNIVERSAL (MISCELLANEOUS) ×4 IMPLANT
GAUZE SPONGE 4X4 12PLY STRL (GAUZE/BANDAGES/DRESSINGS) ×2 IMPLANT
GAUZE XEROFORM 1X8 LF (GAUZE/BANDAGES/DRESSINGS) ×2 IMPLANT
GLOVE SURG SYN 9.0 PF PI (GLOVE) ×2 IMPLANT
GOWN STRL REIN 2XL XLG LVL4 (GOWN DISPOSABLE) ×2 IMPLANT
GOWN STRL REUS W/ TWL LRG LVL3 (GOWN DISPOSABLE) ×2 IMPLANT
KIT TURNOVER KIT A (KITS) ×2 IMPLANT
NS IRRIG 500ML POUR BTL (IV SOLUTION) ×2 IMPLANT
PACK EXTREMITY ARMC (MISCELLANEOUS) ×2 IMPLANT
PAD CAST 4YDX4 CTTN HI CHSV (CAST SUPPLIES) ×2 IMPLANT
PADDING CAST BLEND 3X4 STRL (MISCELLANEOUS) IMPLANT
SUT VIC AB 3-0 SH 27X BRD (SUTURE) IMPLANT
SUTURE EHLN 3-0 FS-10 30 BLK (SUTURE) IMPLANT

## 2023-07-17 NOTE — H&P (Signed)
 Chief Complaint Patient presents with Right Hand - Numbness, Pain  Subjective  Xavier Hebert is a 78 y.o. male who presents for Numbness and Pain of the Right Hand HPI History of Present Illness Xavier Hebert is a 78 year old male who presents for a history and physical examination in preparation for upcoming carpal tunnel and cubital tunnel releases.  He experiences symptoms of carpal tunnel syndrome in the right hand, including constant numbness in the thumb and fingers. Additionally, he has 'electricity' sensations down the arm, indicative of cubital tunnel syndrome. He has previously undergone a similar procedure on the same arm.  He is managing sores on his leg, which are being treated with Uniboots following an allergic reaction to cotton fiber wraps. He is taking doxycycline  100 mg twice daily for this condition.  He is on Eliquis for a history of blood clots. He has experienced bruising in the past but has not had any TIAs or mini-strokes.  He is under the care of a pain clinic and coordinates any pain medication needs through him to avoid contract violations.  He served in the [employer], and [employer], and had a 31-year career with the post [employer], retiring in 2004.  No finger abduction weakness.  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 Gastroesophageal reflux disease with hiatal hernia Generalized anxiety disorder Osteoarthritis 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, severe stage Age-related nuclear cataract of both eyes Primary open angle glaucoma (POAG) of right eye, mild stage Preoperative evaluation to rule out surgical contraindication Cataract Status post laser cataract surgery of both eyes Anxiety Acute embolism and thrombosis of unspecified deep veins of left lower extremity (CMS/HHS-HCC) Deep vein thrombosis (DVT) (CMS/HHS-HCC) Depression, unspecified Diverticulosis of colon without diverticulitis Gout, unspecified Hypogonadism in male Unspecified glaucoma Injury of ulnar nerve at upper arm level, right arm, initial encounter  Outpatient Medications Prior to Visit Medication Sig Dispense Refill allopurinol (ZYLOPRIM) 100 MG tablet Take 100 mg by mouth once daily ammonium lactate (LAC-HYDRIN) 12 % lotion Apply topically as directed apixaban (ELIQUIS) 5 mg tablet Take 5 mg by mouth 2 (two) times daily BD ULTRA-FINE NANO PEN NEEDLE 32 gauge x 5/32" Ndle butalbital -acetaminophen -caffeine  (FIORICET ) 50-325-40 mg tablet Take 1 tablet by mouth every 4 (four) hours as needed for Pain. cholecalciferol (VITAMIN D3) 1000 unit tablet Take 2 tablets by mouth once daily clobetasol (TEMOVATE) 0.05 % ointment Apply topically 2 (two) times daily. coconut oil 1,000 mg Cap Take 1,000 mg by mouth once daily colchicine  (COLCRYS ) 0.6 mg tablet Take 0.6 mg by mouth once daily as needed doxycycline  (VIBRA -TABS) 100 MG tablet Take 100 mg by mouth 2 (two) times daily empagliflozin (JARDIANCE) 25 mg tablet Take 12.5 mg by mouth once daily flash glucose sensor (FREESTYLE LIBRE 14 DAY SENSOR) kit Use 1 kit every 14 (fourteen) days 2 kit 5 flunisolide (NASALIDE) 25 mcg (0.025 %) nasal spray fluticasone  propionate (FLONASE ) 50 mcg/actuation nasal spray Place 2 sprays into both nostrils once daily glucosam/chon-msm1/C/mang/bosw (OSTEO BI-FLEX TRIPLE STRENGTH ORAL) Take by  mouth hydrocortisone 2.5 % cream Apply topically once daily.  hydrOXYzine (ATARAX) 50 MG tablet Take 50 mg by mouth 4 (four) times daily as needed ketoconazole (NIZORAL) 2 % shampoo latanoprost (XALATAN) 0.005 % ophthalmic solution Place 1 drop into the left eye at bedtime 2.5 mL 12 latanoprost (XALATAN) 0.005 % ophthalmic solution PLACE 1 DROP INTO BOTH EYES AT BEDTIME 7.5 mL 4 lidocaine  (LIDODERM ) 5 % patch Place 1 patch onto the skin daily Apply patch to the most painful area for up to 12 hours in a 24 hour period. lisinopril  (PRINIVIL ,ZESTRIL ) 40 MG tablet Take 40 mg by mouth once daily magnesium oxide (MAG-OX) 400 mg (241.3 mg magnesium) tablet Take 400 mg by mouth once daily methocarbamol  (ROBAXIN ) 750 MG tablet Take 750 mg by mouth 4 (four) times daily MULTIVITAMIN ORAL Take 1 tablet by mouth once daily. mupirocin  (BACTROBAN ) 2 % ointment naloxone (NARCAN) 4 mg/actuation nasal spray Place into one nostril oxyCODONE  (ROXICODONE ) 15 MG immediate release tablet Take 15 mg by mouth every 6 (six) hours as needed for Pain. pantoprazole  (PROTONIX ) 20 MG DR tablet Take 20 mg by mouth once daily rosuvastatin  (CRESTOR ) 10 MG tablet Take 1 tablet (10 mg total) by mouth once daily 30 tablet 11 saw palmetto 80 MG capsule Take 80 mg by mouth once daily. semaglutide (OZEMPIC) 0.25 mg or 0.5 mg(2 mg/1.5 mL) pen injector Inject subcutaneously as directed sennosides-docusate (SENOKOT-S) 8.6-50 mg tablet Take 2 tablets by mouth 2 (two) times daily as needed sildenafil, antihypertensive, (REVATIO) 20 mg tablet Take 20 mg by mouth 3 (three) times daily tamsulosin (FLOMAX) 0.4 mg capsule Take 0.4 mg by mouth once daily testosterone  30 mg/actuation (1.5 mL) SlPm 1.5 mLs 2 (two) times daily TRUEPLUS PEN NEEDLE 31 gauge x 5/16" needle tryptophan 500 mg Cap Take 500 mg by mouth daily NUCYNTA ER 100 mg Tb12 Take 1 tablet by mouth once daily (Patient not taking: Reported on 07/04/2023)  No facility-administered  medications prior to visit.    Objective  Vitals: 07/04/23 1459 BP: 104/72 Weight: 81.2 kg (179 lb) Height: 182.9 cm (6') PainSc: 4 PainLoc: Hand  Body mass index is 24.28 kg/m.  Home Vitals:   Physical Exam Physical Exam CHEST: Clear to auscultation bilaterally, no wheezes, rhonchi, or crackles. CARDIOVASCULAR: Normal heart rate and rhythm, S1 and S2 normal without murmurs. MUSCULOSKELETAL: No finger abduction weakness.  Constitutional: alert, in NAD, and communicates well Results    Assessment/Plan:  Assessment & Plan Right carpal tunnel syndrome He is scheduled for right carpal tunnel release surgery on July 10, 2023, due to severe median nerve compression causing constant numbness in the thumb and fingers. The surgery involves a small incision to release the nerve, expected to alleviate symptoms, particularly nocturnal pain, with sensory improvement over time. Most patients experience significant improvement within a month, though full recovery can take longer. Administer IV antibiotics preoperatively.  Right cubital tunnel syndrome He is scheduled for right cubital tunnel release surgery on July 10, 2023, due to ulnar nerve compression causing electrical sensations down the arm. The surgical approach involves an incision behind the elbow to unroof the nerve, which should relieve symptoms.  Blood clot He is on Apixaban (Eliquis) to prevent thromboembolic events. He is advised to continue the anticoagulant without interruption before surgery to prevent serious complications such as pulmonary embolism, despite increased risk of bruising and potential hematoma. Previous cases have shown that stopping blood thinners can lead to TIAs and mini strokes. Wrap the arm with H wrap postoperatively to minimize bleeding.  Wound care for leg sores He is  receiving treatment for leg sores with Uniboot due to an allergic reaction to previous dressings and is on Doxycycline  for this  condition. No anticipated issues with proceeding with the scheduled surgery. Continue current wound care regimen with Uniboot and Doxycycline  100 mg oral bid. Diagnoses and all orders for this visit:  Right carpal tunnel syndrome  Cubital tunnel syndrome on right  This visit was coded based on medical decision making (MDM).    Future Appointments  Date/Time Provider Department Center Visit Type 07/12/2023 2:15 PM Victory Gravel, MD [facility] C POST-OP 07/25/2023 2:15 PM Rojelio Clement, PA [facility] C POST-OP 08/20/2023 1:00 PM Salena Craven, MD [facility] [address] [facility] [place] RETURN VISIT    There are no Patient Instructions on file for this visit.  An after visit summary was provided for the patient either in written format (printed) or through MyChart.  This note has been created using automated tools and reviewed for accuracy by [facility].  Electronically signed by Victory Gravel, MD at 07/05/2023 9:13 AM EDT  Back to top of Progress Notes  Reviewed  H+P. No changes noted.

## 2023-07-17 NOTE — Op Note (Signed)
 07/17/2023  12:43 PM  PATIENT:  Xavier Hebert  78 y.o. male  PRE-OPERATIVE DIAGNOSIS:  Right carpal tunnel syndrome G56.01 Cubital tunnel syndrome on right G56.21 Pain in joints of right hand M25.541  POST-OPERATIVE DIAGNOSIS:  Right carpal tunnel syndrome  Cubital tunnel syndrome on right  Pain in joints of right hand  PROCEDURE:  Procedure(s): CARPAL TUNNEL RELEASE (Right) RELEASE, CUBITAL TUNNEL (Right)  SURGEON: Terance Felt, MD  ASSISTANTS: none  ANESTHESIA:   general  EBL:  Total I/O In: 100 [IV Piggyback:100] Out: 2 [Blood:2]  BLOOD ADMINISTERED:none  DRAINS: none   LOCAL MEDICATIONS USED:  MARCAINE     and Amount: 20  ml  SPECIMEN:  No Specimen  DISPOSITION OF SPECIMEN:  N/A  COUNTS:  YES  TOURNIQUET:   Total Tourniquet Time Documented: Upper Arm (Right) - 23 minutes Total: Upper Arm (Right) - 23 minutes   IMPLANTS: none  DICTATION: .Dragon Dictation after general anesthesia was obtained and the right arm was prepped and draped in usual sterile manner with a tourniquet applied to the upper arm.  After patient identification and timeout procedures were completed tourniquet was raised to 250 mmHg.  The cubital tunnel was addressed first with a incision between the medial epicondyle and olecranon with skin subcutaneous tissue spread preserving cutaneous nerves as much as possible.  The cubital tunnel was opened and released carried out to the muscle distally proximally the nerve was noted to be compressed at the level of the medial epicondyle going more proximally there was no compression above this area to the level of the intermuscular septum.  The wound was irrigated and 10 cc Marcaine  injected.  The wound was closed with simple interrupted 3-0 Vicryl followed by 4-0 nylon in a simple erupted manner.  Attention was then turned to the carpal tunnel was a 2 cm incision made in line with the ring metacarpal starting at the distal wrist flexion crease subcutaneous  tissue spread and vascular hemostat placed underneath the ligament to protect the underlying structure with release carried out distally and proximally there was moderate compression in the midportion of the carpal tunnel under the ligament.  The wound was then infiltrated with 10 cc of Marcaine  for postop analgesia and the wound closed with simple operative 4-0 nylon.  Xeroform 4 x 4 web roll and Ace wrap is applied to the tourniquet let down.  PLAN OF CARE: Discharge to home after PACU  PATIENT DISPOSITION:  PACU - hemodynamically stable.

## 2023-07-17 NOTE — Discharge Instructions (Signed)
 Loosen Ace wrap when you get home and if your fingers swell but leave underlying cotton roll alone Work on finger motion all you can put on your sling at home for comfort for the elbow Keep arm elevated is much as possible Pain medicine as directed Call office if you are having problems [phone]

## 2023-07-17 NOTE — Transfer of Care (Signed)
 Immediate Anesthesia Transfer of Care Note  Patient: Xavier Hebert  Procedure(s) Performed: CARPAL TUNNEL RELEASE (Right: Hand) RELEASE, CUBITAL TUNNEL (Right: Arm Upper)  Patient Location: PACU  Anesthesia Type: General ETT  Level of Consciousness: awake, alert  and patient cooperative  Airway and Oxygen Therapy: Patient Spontanous Breathing and Patient connected to supplemental oxygen  Post-op Assessment: Post-op Vital signs reviewed, Patient's Cardiovascular Status Stable, Respiratory Function Stable, Patent Airway and No signs of Nausea or vomiting  Post-op Vital Signs: Reviewed and stable  Complications: No notable events documented.

## 2023-07-17 NOTE — Anesthesia Postprocedure Evaluation (Signed)
 Anesthesia Post Note  Patient: Xavier Hebert  Procedure(s) Performed: CARPAL TUNNEL RELEASE (Right: Hand) RELEASE, CUBITAL TUNNEL (Right: Arm Upper)  Patient location during evaluation: PACU Anesthesia Type: General Level of consciousness: awake and alert Pain management: pain level controlled Vital Signs Assessment: post-procedure vital signs reviewed and stable Respiratory status: spontaneous breathing, nonlabored ventilation, respiratory function stable and patient connected to nasal cannula oxygen Cardiovascular status: blood pressure returned to baseline and stable Postop Assessment: no apparent nausea or vomiting Anesthetic complications: no   No notable events documented.   Last Vitals:  Vitals:   07/17/23 1257 07/17/23 1300  BP:  118/68  Pulse: 87 82  Resp: 19 17  Temp:  36.5 C  SpO2: 96% 97%    Last Pain:  Vitals:   07/17/23 1300  TempSrc:   PainSc: 0-No pain                 Bayan Kushnir C Tremeka Helbling

## 2023-07-17 NOTE — Addendum Note (Signed)
 Addendum  created 07/17/23 1336 by Juanda Noon, CRNA   Flowsheet accepted, Flowsheet data copied forward, Intraprocedure Flowsheets edited

## 2023-07-17 NOTE — Anesthesia Procedure Notes (Signed)
 Procedure Name: Intubation Date/Time: 07/17/2023 12:00 PM  Performed by: Juanda Noon, CRNAPre-anesthesia Checklist: Patient identified, Patient being monitored, Timeout performed, Emergency Drugs available and Suction available Patient Re-evaluated:Patient Re-evaluated prior to induction Oxygen Delivery Method: Circle system utilized Preoxygenation: Pre-oxygenation with 100% oxygen Induction Type: IV induction Ventilation: Mask ventilation without difficulty Laryngoscope Size: Mac, McGrath and 4 Grade View: Grade I Tube type: Oral Tube size: 7.0 mm Number of attempts: 1 Airway Equipment and Method: Stylet Placement Confirmation: ETT inserted through vocal cords under direct vision, positive ETCO2 and breath sounds checked- equal and bilateral Secured at: 23 cm Tube secured with: Tape Dental Injury: Teeth and Oropharynx as per pre-operative assessment

## 2023-07-20 ENCOUNTER — Encounter: Payer: Self-pay | Admitting: Podiatry

## 2023-07-20 ENCOUNTER — Ambulatory Visit (INDEPENDENT_AMBULATORY_CARE_PROVIDER_SITE_OTHER): Admitting: Podiatry

## 2023-07-20 VITALS — Ht 72.0 in | Wt 162.0 lb

## 2023-07-20 DIAGNOSIS — R600 Localized edema: Secondary | ICD-10-CM

## 2023-07-20 DIAGNOSIS — L97912 Non-pressure chronic ulcer of unspecified part of right lower leg with fat layer exposed: Secondary | ICD-10-CM | POA: Diagnosis not present

## 2023-07-20 DIAGNOSIS — E119 Type 2 diabetes mellitus without complications: Secondary | ICD-10-CM

## 2023-07-20 MED ORDER — CEPHALEXIN 500 MG PO CAPS
500.0000 mg | ORAL_CAPSULE | Freq: Three times a day (TID) | ORAL | 0 refills | Status: DC
Start: 2023-07-20 — End: 2023-08-17

## 2023-07-20 NOTE — Progress Notes (Signed)
 Chief Complaint  Patient presents with   Wound Check    Pt is here due to wounds on bilateral ankle/legs, pt states they have been there for a while, was seeing Dr Luster Salters for same issue  but started going to wound center, would like Dr Luster Salters to resume care.    HPI: 78 y.o. male presenting today for evaluation of ulcers to the right leg.  Patient has been treated routinely for the past month at the [facility] wound care center.  Patient was apparently unsatisfied with the care that he received and presenting here for follow-up treatment  Past Medical History:  Diagnosis Date   Age-related nuclear cataract of both eyes 01/21/2022   Anxiety    Arthritis    Chronic kidney disease, stage 3a (HCC) 08/16/2022   Aug 16, 2022 Entered By: Ellie Gutta A Comment: b/l Cr ~2-2.2   Chronic pain syndrome    CKD (chronic kidney disease) stage 3, GFR 30-59 ml/min (HCC)    Depression    Diabetes mellitus without complication (HCC)    Type II   DVT (deep venous thrombosis) (HCC)    Left lower leg   Episodic tension-type headache, not intractable 08/06/2022   Exposure to potentially hazardous substance 08/16/2022   Jul 05, 2022 Entered By: Lourdes Roy Comment: Entered automatically through [facility] Problem List documentation program   GERD (gastroesophageal reflux disease)    Headache    Migraine- headache   Hiatal hernia    History of urinary stone 08/06/2022   Hyperlipidemia    Hypertension    Kidney stones    Neck stiffness    limited up and down motion   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, unspecified laterality 11/27/2019   Peptic ulcer    Sleep apnea    does not use cpap/ pt states he lost 60#   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) 07/17/2019   Status post laser cataract surgery of both eyes 05/10/2022   Unspecified glaucoma 08/06/2022   Wears hearing aid in both ears    Has, does not wear    Past Surgical History:  Procedure Laterality  Date   ANTERIOR CERVICAL DECOMP/DISCECTOMY FUSION  2009   CARPAL TUNNEL RELEASE Left 01/03/2018   Procedure: CARPAL TUNNEL RELEASE;  Surgeon: Molli Angelucci, MD;  Location: [facility] ORS;  Service: Orthopedics;  Laterality: Left;   CARPAL TUNNEL RELEASE Right 07/17/2023   Procedure: CARPAL TUNNEL RELEASE;  Surgeon: Molli Angelucci, MD;  Location: [facility];  Service: Orthopedics;  Laterality: Right;   CARPOMETACARPAL ([facility]) FUSION OF THUMB Left 08/10/2020   Procedure: CARPOMETACARPAL [facility]) FUSION OF THUMB;  Surgeon: Molli Angelucci, MD;  Location: [facility] ORS;  Service: Orthopedics;  Laterality: Left;   COLONOSCOPY WITH PROPOFOL  N/A 11/21/2017   Procedure: COLONOSCOPY WITH PROPOFOL ;  Surgeon: Toledo, Alphonsus Jeans, MD;  Location: [facility] END[facility]COPY;  Service: Gastroenterology;  Laterality: N/A;   CYSTOSCOPY     x3- 2 times for stone removal    ESOPHAGOGASTRODUODENOSCOPY N/A 10/14/2017   Procedure: ESOPHAGOGASTRODUODENOSCOPY (EGD);  Surgeon: Selena Daily, MD;  Location: [facility] END[facility];  Service: Gastroenterology;  Laterality: N/A;   ESOPHAGOGASTRODUODENOSCOPY (EGD) WITH PROPOFOL  N/A 10/25/2017   Procedure: ESOPHAGOGASTRODUODENOSCOPY (EGD) WITH PROPOFOL ;  Surgeon: Selena Daily, MD;  Location: [facility] END[facility]COPY;  Service: Gastroenterology;  Laterality: N/A;   JOINT REPLACEMENT     bil. knees , shoulder rt, and neck    REVERSE SHOULDER ARTHROPLASTY Right 12/01/2014   REVERSE SHOULDER ARTHROPLASTY Right 12/01/2014  Procedure: REVERSE SHOULDER ARTHROPLASTY;  Surgeon: Jasmine Mesi, MD;  Location: [facility] OR;  Service: Orthopedics;  Laterality: Right;   SHOULDER ARTHROSCOPY W/ ROTATOR CUFF REPAIR Right 06/10/2014   "failed"   SHOULDER OPEN ROTATOR CUFF REPAIR Left ~ 2008   TOTAL KNEE ARTHROPLASTY Bilateral 2005-2008   "right-left"   TRIGGER FINGER RELEASE Right 2001   TRIGGER FINGER RELEASE Left 08/10/2020   Procedure: RELEASE TRIGGER FINGER/A-1 PULLEY;  Surgeon: Molli Angelucci, MD;  Location: [facility] OR[facility];   Service: Orthopedics;  Laterality: Left;   ULNAR TUNNEL RELEASE Right 07/17/2023   Procedure: RELEASE, CUBITAL TUNNEL;  Surgeon: Molli Angelucci, MD;  Location: [facility];  Service: Orthopedics;  Laterality: Right;   URETERAL STENT PLACEMENT      Allergies  Allergen Reactions   Ivp Dye [Iodinated Contrast Media] Anaphylaxis and Other (See Comments)    Breathing difficulty and chest pain   Quinine Shortness Of Breath, Rash and Other (See Comments)    Other reaction(s): SHORTNESS OF BREATH   Amlodipine  Swelling    Other reaction(s): Edema of lower extremity   Aspirin Other (See Comments)    Stomach pain   Gabapentin     Other reaction(s): Dizziness   Glipizide Other (See Comments)    Other reaction(s): Chest pain   Hydrochlorothiazide Other (See Comments)    Other reaction(s): Impotence   Ibuprofen Other (See Comments)    Stomach pain  Other Reaction(s): ITCHING,WATERING EYES   Ioxaglate Other (See Comments)    Breathing difficulty and chest pain   Latex Itching    Gloves   Metformin And Related Diarrhea   Nsaids     Kidneys   Omeprazole  Other (See Comments)    Other reaction(s): FAILED THERAPY   Pregabalin Other (See Comments)    Other reaction(s): Dizziness   Doxycycline  Anxiety   Pioglitazone Diarrhea and Other (See Comments)    Other reaction(s): Diarrhea, Abdominal pain   Tape Rash    Some bandages and wraps have started causing rashes.  Unsure if it is the adhesive or the bandage material    07/20/2023  Physical Exam: General: The patient is alert and oriented x3 in no acute distress.  Dermatology: Multiple superficial open wounds noted to the right leg with a solitary ulcer to the anterolateral aspect of the left leg.  Granular wound base noted to the wounds with good potential for healing.  No obvious indication of infection.  There is some scant serosanguineous drainage noted.  No purulence.  Vascular: Edema noted right lower extremity.  Neurological:  Grossly intact via light touch  Musculoskeletal Exam: No pedal deformities noted.  No crepitus to the leg.  Assessment/Plan of Care: 1.  Edema right lower extremity 2.  H/o DVT RLE 3.  Ulcer right leg and left anterior leg  -Patient evaluated.  Comprehensive diabetic foot exam performed today -Multilayer Unna boot compression wrap was applied to the right lower extremity to address the edema to the right leg.  Patient actually had good results with Unna boot application prior -Medically necessary excisional debridement including subcutaneous tissue was performed today using a tissue nipper.  Excisional debridement of the necrotic nonviable tissue down to healthier bleeding viable tissue was performed with postdebridement measurement same as pre- - Doxycycline  was prescribed by me last time that the patient had an acute flareup of cellulitis however he states that since that time he has been prescribed doxycycline  and had an allergic reaction to the medication. -Prescription for Keflex 500 mg 3 times daily  x 10 days -Hydrofera Blue provided for the patient to apply to the left anterior leg with a Band-Aid -Return to clinic 1 week      Dot Gazella, DPM [facility]  Dr. Dot Gazella, DPM    [address] [place], [place] [address]                Office [phone]  Fax [phone]

## 2023-07-24 ENCOUNTER — Ambulatory Visit: Admitting: Physician Assistant

## 2023-07-27 ENCOUNTER — Encounter: Payer: Self-pay | Admitting: Podiatry

## 2023-07-27 ENCOUNTER — Ambulatory Visit (INDEPENDENT_AMBULATORY_CARE_PROVIDER_SITE_OTHER): Admitting: Podiatry

## 2023-07-27 DIAGNOSIS — L97912 Non-pressure chronic ulcer of unspecified part of right lower leg with fat layer exposed: Secondary | ICD-10-CM | POA: Diagnosis not present

## 2023-07-27 DIAGNOSIS — R600 Localized edema: Secondary | ICD-10-CM

## 2023-07-27 DIAGNOSIS — L03115 Cellulitis of right lower limb: Secondary | ICD-10-CM

## 2023-07-27 NOTE — Progress Notes (Signed)
 Chief Complaint  Patient presents with   Diabetic Ulcer    "Check and see how it's doing."    HPI: 77 y.o. male presenting today for follow-up evaluation of ulcers to the right lower extremity.  Unna boot dressings applied last visit.  They have been clean dry and intact since his last visit  Past Medical History:  Diagnosis Date   Age-related nuclear cataract of both eyes 01/21/2022   Anxiety    Arthritis    Chronic kidney disease, stage 3a (HCC) 08/16/2022   Aug 16, 2022 Entered By: Ellie Gutta A Comment: b/l Cr ~2-2.2   Chronic pain syndrome    CKD (chronic kidney disease) stage 3, GFR 30-59 ml/min (HCC)    Depression    Diabetes mellitus without complication (HCC)    Type II   DVT (deep venous thrombosis) (HCC)    Left lower leg   Episodic tension-type headache, not intractable 08/06/2022   Exposure to potentially hazardous substance 08/16/2022   Jul 05, 2022 Entered By: Lourdes Roy Comment: Entered automatically through [facility] Problem List documentation program   GERD (gastroesophageal reflux disease)    Headache    Migraine- headache   Hiatal hernia    History of urinary stone 08/06/2022   Hyperlipidemia    Hypertension    Kidney stones    Neck stiffness    limited up and down motion   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, unspecified laterality 11/27/2019   Peptic ulcer    Sleep apnea    does not use cpap/ pt states he lost 60#   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) 07/17/2019   Status post laser cataract surgery of both eyes 05/10/2022   Unspecified glaucoma 08/06/2022   Wears hearing aid in both ears    Has, does not wear    Past Surgical History:  Procedure Laterality Date   ANTERIOR CERVICAL DECOMP/DISCECTOMY FUSION  2009   CARPAL TUNNEL RELEASE Left 01/03/2018   Procedure: CARPAL TUNNEL RELEASE;  Surgeon: Molli Angelucci, MD;  Location: [facility] ORS;  Service: Orthopedics;  Laterality: Left;   CARPAL  TUNNEL RELEASE Right 07/17/2023   Procedure: CARPAL TUNNEL RELEASE;  Surgeon: Molli Angelucci, MD;  Location: [facility];  Service: Orthopedics;  Laterality: Right;   CARPOMETACARPAL (CMC) FUSION OF THUMB Left 08/10/2020   Procedure: CARPOMETACARPAL [facility]) FUSION OF THUMB;  Surgeon: Molli Angelucci, MD;  Location: [facility] ORS;  Service: Orthopedics;  Laterality: Left;   COLONOSCOPY WITH PROPOFOL  N/A 11/21/2017   Procedure: COLONOSCOPY WITH PROPOFOL ;  Surgeon: Toledo, Alphonsus Jeans, MD;  Location: [facility] ENDOSCOPY;  Service: Gastroenterology;  Laterality: N/A;   CYSTOSCOPY     x3- 2 times for stone removal    ESOPHAGOGASTRODUODENOSCOPY N/A 10/14/2017   Procedure: ESOPHAGOGASTRODUODENOSCOPY (EGD);  Surgeon: Selena Daily, MD;  Location: [facility] ENDOSCOPY;  Service: Gastroenterology;  Laterality: N/A;   ESOPHAGOGASTRODUODENOSCOPY (EGD) WITH PROPOFOL  N/A 10/25/2017   Procedure: ESOPHAGOGASTRODUODENOSCOPY (EGD) WITH PROPOFOL ;  Surgeon: Selena Daily, MD;  Location: [facility] ENDOSCOPY;  Service: Gastroenterology;  Laterality: N/A;   JOINT REPLACEMENT     bil. knees , shoulder rt, and neck    REVERSE SHOULDER ARTHROPLASTY Right 12/01/2014   REVERSE SHOULDER ARTHROPLASTY Right 12/01/2014   Procedure: REVERSE SHOULDER ARTHROPLASTY;  Surgeon: Jasmine Mesi, MD;  Location: [facility] OR;  Service: Orthopedics;  Laterality: Right;   SHOULDER ARTHROSCOPY W/ ROTATOR CUFF REPAIR Right 06/10/2014   "failed"   SHOULDER OPEN ROTATOR CUFF REPAIR Left ~ 2008  TOTAL KNEE ARTHROPLASTY Bilateral 2005-2008   "right-left"   TRIGGER FINGER RELEASE Right 2001   TRIGGER FINGER RELEASE Left 08/10/2020   Procedure: RELEASE TRIGGER FINGER/A-1 PULLEY;  Surgeon: Molli Angelucci, MD;  Location: [facility] ORS;  Service: Orthopedics;  Laterality: Left;   ULNAR TUNNEL RELEASE Right 07/17/2023   Procedure: RELEASE, CUBITAL TUNNEL;  Surgeon: Molli Angelucci, MD;  Location: [facility];  Service: Orthopedics;  Laterality: Right;   URETERAL  STENT PLACEMENT      Allergies  Allergen Reactions   Ivp Dye [Iodinated Contrast Media] Anaphylaxis and Other (See Comments)    Breathing difficulty and chest pain   Quinine Shortness Of Breath, Rash and Other (See Comments)    Other reaction(s): SHORTNESS OF BREATH   Amlodipine  Swelling    Other reaction(s): Edema of lower extremity   Aspirin Other (See Comments)    Stomach pain   Gabapentin     Other reaction(s): Dizziness   Glipizide Other (See Comments)    Other reaction(s): Chest pain   Hydrochlorothiazide Other (See Comments)    Other reaction(s): Impotence   Ibuprofen Other (See Comments)    Stomach pain  Other Reaction(s): ITCHING,WATERING EYES   Ioxaglate Other (See Comments)    Breathing difficulty and chest pain   Latex Itching    Gloves   Metformin And Related Diarrhea   Nsaids     Kidneys   Omeprazole  Other (See Comments)    Other reaction(s): FAILED THERAPY   Pregabalin Other (See Comments)    Other reaction(s): Dizziness   Doxycycline  Anxiety   Pioglitazone Diarrhea and Other (See Comments)    Other reaction(s): Diarrhea, Abdominal pain   Tape Rash    Some bandages and wraps have started causing rashes.  Unsure if it is the adhesive or the bandage material    07/20/2023  Physical Exam: General: The patient is alert and oriented x3 in no acute distress.  Dermatology: Multiple superficial open wounds noted to the right leg with a solitary ulcer to the anterolateral aspect of the left leg which has healed.  Granular wound base noted to the wounds with good potential for healing.  No obvious indication of infection.  There is some scant serosanguineous drainage noted.  No purulence.  Vascular: Edema noted right lower extremity.  Neurological: Grossly intact via light touch  Musculoskeletal Exam: No pedal deformities noted.  No crepitus to the leg.  Assessment/Plan of Care: 1.  Edema right lower extremity 2.  H/o DVT RLE 3.  Ulcer right leg and left  anterior leg 4.  Cellulitis right leg; resolved  -Patient evaluated.  Overall significant improvement of the edema as well as the wounds to the leg.  Continue the same regimen -Multilayer Unna boot compression wrap was applied to the right lower extremity to address the edema to the right leg.  Patient actually had good results with Unna boot application prior -Medically necessary excisional debridement including subcutaneous tissue was performed today using a tissue nipper.  Excisional debridement of the necrotic nonviable tissue down to healthier bleeding viable tissue was performed with postdebridement measurement same as pre- -patient states that he did not take the keflex  500 mg 3 times daily x 10 days -Return to clinic 1 week      Dot Gazella, DPM [facility]  Dr. Dot Gazella, DPM    [address]  [place], [place] [address]                Office [phone]  Fax [phone]

## 2023-08-01 ENCOUNTER — Ambulatory Visit: Payer: Medicare Other | Admitting: Family

## 2023-08-03 ENCOUNTER — Ambulatory Visit: Admitting: Family

## 2023-08-03 ENCOUNTER — Ambulatory Visit (INDEPENDENT_AMBULATORY_CARE_PROVIDER_SITE_OTHER): Admitting: Podiatry

## 2023-08-03 DIAGNOSIS — L97912 Non-pressure chronic ulcer of unspecified part of right lower leg with fat layer exposed: Secondary | ICD-10-CM | POA: Diagnosis not present

## 2023-08-03 DIAGNOSIS — R600 Localized edema: Secondary | ICD-10-CM | POA: Diagnosis not present

## 2023-08-03 NOTE — Progress Notes (Signed)
 Chief Complaint  Patient presents with   Diabetic Ulcer    "I think it's doing really good.  Some of the places have healed.  I've got a little discoloration.  I was having a problem with my leg swelling.  The unna boot rubbed a little spot."    HPI: 78 y.o. male presenting today for follow-up evaluation of ulcers to the right lower extremity.  Unna boot dressings applied last visit.  They have been clean dry and intact since his last visit  Past Medical History:  Diagnosis Date   Age-related nuclear cataract of both eyes 01/21/2022   Anxiety    Arthritis    Chronic kidney disease, stage 3a (HCC) 08/16/2022   Aug 16, 2022 Entered By: Ellie Gutta A Comment: b/l Cr ~2-2.2   Chronic pain syndrome    CKD (chronic kidney disease) stage 3, GFR 30-59 ml/min (HCC)    Depression    Diabetes mellitus without complication (HCC)    Type II   DVT (deep venous thrombosis) (HCC)    Left lower leg   Episodic tension-type headache, not intractable 08/06/2022   Exposure to potentially hazardous substance 08/16/2022   Jul 05, 2022 Entered By: Lourdes Roy Comment: Entered automatically through [facility] Problem List documentation program   GERD (gastroesophageal reflux disease)    Headache    Migraine- headache   Hiatal hernia    History of urinary stone 08/06/2022   Hyperlipidemia    Hypertension    Kidney stones    Neck stiffness    limited up and down motion   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, unspecified laterality 11/27/2019   Peptic ulcer    Sleep apnea    does not use cpap/ pt states he lost 60#   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) 07/17/2019   Status post laser cataract surgery of both eyes 05/10/2022   Unspecified glaucoma 08/06/2022   Wears hearing aid in both ears    Has, does not wear    Past Surgical History:  Procedure Laterality Date   ANTERIOR CERVICAL DECOMP/DISCECTOMY FUSION  2009   CARPAL TUNNEL RELEASE Left  01/03/2018   Procedure: CARPAL TUNNEL RELEASE;  Surgeon: Molli Angelucci, MD;  Location: [facility];  Service: Orthopedics;  Laterality: Left;   CARPAL TUNNEL RELEASE Right 07/17/2023   Procedure: CARPAL TUNNEL RELEASE;  Surgeon: Molli Angelucci, MD;  Location: [facility];  Service: Orthopedics;  Laterality: Right;   CARPOMETACARPAL (CMC) FUSION OF THUMB Left 08/10/2020   Procedure: CARPOMETACARPAL [facility]) FUSION OF THUMB;  Surgeon: Molli Angelucci, MD;  Location: [facility] ORS;  Service: Orthopedics;  Laterality: Left;   COLONOSCOPY WITH PROPOFOL  N/A 11/21/2017   Procedure: COLONOSCOPY WITH PROPOFOL ;  Surgeon: Toledo, Alphonsus Jeans, MD;  Location: [facility] ENDOSCOPY;  Service: Gastroenterology;  Laterality: N/A;   CYSTOSCOPY     x3- 2 times for stone removal    ESOPHAGOGASTRODUODENOSCOPY N/A 10/14/2017   Procedure: ESOPHAGOGASTRODUODENOSCOPY (EGD);  Surgeon: Selena Daily, MD;  Location: [facility] ENDOSCOPY;  Service: Gastroenterology;  Laterality: N/A;   ESOPHAGOGASTRODUODENOSCOPY (EGD) WITH PROPOFOL  N/A 10/25/2017   Procedure: ESOPHAGOGASTRODUODENOSCOPY (EGD) WITH PROPOFOL ;  Surgeon: Selena Daily, MD;  Location: [facility] ENDOSCOPY;  Service: Gastroenterology;  Laterality: N/A;   JOINT REPLACEMENT     bil. knees , shoulder rt, and neck    REVERSE SHOULDER ARTHROPLASTY Right 12/01/2014   REVERSE SHOULDER ARTHROPLASTY Right 12/01/2014   Procedure: REVERSE SHOULDER ARTHROPLASTY;  Surgeon: Jasmine Mesi, MD;  Location: [facility]  OR;  Service: Orthopedics;  Laterality: Right;   SHOULDER ARTHROSCOPY W/ ROTATOR CUFF REPAIR Right 06/10/2014   "failed"   SHOULDER OPEN ROTATOR CUFF REPAIR Left ~ 2008   TOTAL KNEE ARTHROPLASTY Bilateral 2005-2008   "right-left"   TRIGGER FINGER RELEASE Right 2001   TRIGGER FINGER RELEASE Left 08/10/2020   Procedure: RELEASE TRIGGER FINGER/A-1 PULLEY;  Surgeon: Molli Angelucci, MD;  Location: [facility] ORS;  Service: Orthopedics;  Laterality: Left;   ULNAR TUNNEL RELEASE Right 07/17/2023    Procedure: RELEASE, CUBITAL TUNNEL;  Surgeon: Molli Angelucci, MD;  Location: [facility]ERY [facility];  Service: Orthopedics;  Laterality: Right;   URETERAL STENT PLACEMENT      Allergies  Allergen Reactions   Ivp Dye [Iodinated Contrast Media] Anaphylaxis and Other (See Comments)    Breathing difficulty and chest pain   Quinine Shortness Of Breath, Rash and Other (See Comments)    Other reaction(s): SHORTNESS OF BREATH   Amlodipine  Swelling    Other reaction(s): Edema of lower extremity   Aspirin Other (See Comments)    Stomach pain   Gabapentin     Other reaction(s): Dizziness   Glipizide Other (See Comments)    Other reaction(s): Chest pain   Hydrochlorothiazide Other (See Comments)    Other reaction(s): Impotence   Ibuprofen Other (See Comments)    Stomach pain  Other Reaction(s): ITCHING,WATERING EYES   Ioxaglate Other (See Comments)    Breathing difficulty and chest pain   Latex Itching    Gloves   Metformin And Related Diarrhea   Nsaids     Kidneys   Omeprazole  Other (See Comments)    Other reaction(s): FAILED THERAPY   Pregabalin Other (See Comments)    Other reaction(s): Dizziness   Doxycycline  Anxiety   Pioglitazone Diarrhea and Other (See Comments)    Other reaction(s): Diarrhea, Abdominal pain   Tape Rash    Some bandages and wraps have started causing rashes.  Unsure if it is the adhesive or the bandage material    07/20/2023  Physical Exam: General: The patient is alert and oriented x3 in no acute distress.  Dermatology: Multiple superficial open wounds noted to the right leg with a solitary ulcer to the anterolateral aspect of the left leg which has healed.  Granular wound base noted to the wounds with good potential for healing.  No obvious indication of infection.  There is some scant serosanguineous drainage noted.  No purulence.  Vascular: Edema noted right lower extremity.  Neurological: Grossly intact via light touch  Musculoskeletal Exam: No pedal  deformities noted.  No crepitus to the leg.  Assessment/Plan of Care: 1.  Edema right lower extremity 2.  H/o DVT RLE 3.  Ulcer right leg and left anterior leg 4.  Cellulitis right leg  -Patient evaluated.   -Medically necessary excisional debridement including subcutaneous tissue was performed today using a tissue nipper.  Excisional debridement of the necrotic nonviable tissue down to healthier bleeding viable tissue was performed with postdebridement measurement same as pre- -patient states that he did not take the keflex  500 mg 3 times daily x 10 days.  He still has the prescription.  He will begin taking today due to the mild edema and erythema to the leg -Hydrofera Blue applied with multilayer compression dressing. -Return to clinic 3 weeks      Dot Gazella, DPM [facility]  Dr. Dot Gazella, DPM    [address]  [place], [place] [address]                Office [phone]  Fax [phone]

## 2023-08-07 ENCOUNTER — Ambulatory Visit: Admitting: Family

## 2023-08-07 VITALS — BP 110/62 | HR 85 | Ht 72.0 in | Wt 169.0 lb

## 2023-08-07 DIAGNOSIS — E1122 Type 2 diabetes mellitus with diabetic chronic kidney disease: Secondary | ICD-10-CM | POA: Diagnosis not present

## 2023-08-07 DIAGNOSIS — Z794 Long term (current) use of insulin: Secondary | ICD-10-CM | POA: Diagnosis not present

## 2023-08-07 DIAGNOSIS — E538 Deficiency of other specified B group vitamins: Secondary | ICD-10-CM | POA: Diagnosis not present

## 2023-08-07 DIAGNOSIS — N184 Chronic kidney disease, stage 4 (severe): Secondary | ICD-10-CM | POA: Diagnosis not present

## 2023-08-07 DIAGNOSIS — Z0130 Encounter for examination of blood pressure without abnormal findings: Secondary | ICD-10-CM

## 2023-08-07 LAB — POCT UA - MICROALBUMIN
Albumin/Creatinine Ratio, Urine, POC: >300
Creatinine, POC: 100 mg/dL
Microalbumin Ur, POC: 150 mg/L

## 2023-08-07 MED ORDER — CYANOCOBALAMIN 1000 MCG/ML IJ SOLN
1000.0000 ug | Freq: Once | INTRAMUSCULAR | Status: AC
  Administered 2023-08-07: 1000 ug via INTRAMUSCULAR

## 2023-08-17 ENCOUNTER — Ambulatory Visit: Admitting: Podiatry

## 2023-08-17 ENCOUNTER — Encounter: Payer: Self-pay | Admitting: Podiatry

## 2023-08-17 VITALS — Ht 72.0 in | Wt 169.0 lb

## 2023-08-17 DIAGNOSIS — R600 Localized edema: Secondary | ICD-10-CM

## 2023-08-17 DIAGNOSIS — L03115 Cellulitis of right lower limb: Secondary | ICD-10-CM | POA: Diagnosis not present

## 2023-08-17 MED ORDER — CEPHALEXIN 500 MG PO CAPS: 500.0000 mg | ORAL_CAPSULE | Freq: Three times a day (TID) | ORAL | 0 refills | Status: DC

## 2023-08-17 NOTE — Progress Notes (Signed)
 Chief Complaint  Patient presents with   Wound Check    Pt is here to f/u on right foot/leg due to edema/ wound he states the wound has improved tremendously.    HPI: 78 y.o. male presenting today for follow-up evaluation of ulcers to the right lower extremity with cellulitis.  Overall he has noticed significant improvement.  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)    Type II   DVT (deep venous thrombosis) (HCC)    Left lower leg   Episodic tension-type headache, not intractable 08/06/2022   Exposure to potentially hazardous substance 08/16/2022   Jul 05, 2022 Entered By: Lourdes Roy Comment: Entered automatically through [facility] Problem List documentation program   GERD (gastroesophageal reflux disease)    Headache    Migraine- headache   Hiatal hernia    History of urinary stone 08/06/2022   Hyperlipidemia    Hypertension    Kidney stones    Neck stiffness    limited up and down motion   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, unspecified laterality 11/27/2019   Peptic ulcer    Sleep apnea    does not use cpap/ pt states he lost 60#   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) 07/17/2019   Status post laser cataract surgery of both eyes 05/10/2022   Unspecified glaucoma 08/06/2022   Wears hearing aid in both ears    Has, does not wear    Past Surgical History:  Procedure Laterality Date   ANTERIOR CERVICAL DECOMP/DISCECTOMY FUSION  2009   CARPAL TUNNEL RELEASE Left 01/03/2018   Procedure: CARPAL TUNNEL RELEASE;  Surgeon: Molli Angelucci, MD;   Location: [facility] ORS;  Service: Orthopedics;  Laterality: Left;   CARPAL TUNNEL RELEASE Right 07/17/2023   Procedure: CARPAL TUNNEL RELEASE;  Surgeon: Molli Angelucci, MD;  Location: [facility];  Service: Orthopedics;  Laterality: Right;   CARPOMETACARPAL (CMC) FUSION OF THUMB Left 08/10/2020   Procedure: CARPOMETACARPAL [facility]) FUSION OF THUMB;  Surgeon: Molli Angelucci, MD;  Location: [facility] ORS;  Service: Orthopedics;  Laterality: Left;   COLONOSCOPY WITH PROPOFOL  N/A 11/21/2017   Procedure: COLONOSCOPY WITH PROPOFOL ;  Surgeon: Toledo, Alphonsus Jeans, MD;  Location: [facility] ENDOSCOPY;  Service: Gastroenterology;  Laterality: N/A;   CYSTOSCOPY     x3- 2 times for stone removal    ESOPHAGOGASTRODUODENOSCOPY N/A 10/14/2017   Procedure: ESOPHAGOGASTRODUODENOSCOPY (EGD);  Surgeon: Selena Daily, MD;  Location: [facility] ENDOSCOPY;  Service: Gastroenterology;  Laterality: N/A;   ESOPHAGOGASTRODUODENOSCOPY (EGD) WITH PROPOFOL  N/A 10/25/2017   Procedure: ESOPHAGOGASTRODUODENOSCOPY (EGD) WITH PROPOFOL ;  Surgeon: Selena Daily, MD;  Location: [facility] ENDOSCOPY;  Service: Gastroenterology;  Laterality: N/A;   JOINT REPLACEMENT     bil. knees , shoulder rt, and neck    REVERSE SHOULDER ARTHROPLASTY Right 12/01/2014   REVERSE SHOULDER ARTHROPLASTY Right 12/01/2014   Procedure: REVERSE SHOULDER ARTHROPLASTY;  Surgeon: Jasmine Mesi, MD;  Location: [facility] OR;  Service: Orthopedics;  Laterality: Right;   SHOULDER ARTHROSCOPY W/ ROTATOR CUFF REPAIR Right 06/10/2014   "failed"  SHOULDER OPEN ROTATOR CUFF REPAIR Left ~ 2008   TOTAL KNEE ARTHROPLASTY Bilateral 2005-2008   "right-left"   TRIGGER FINGER RELEASE Right 2001   TRIGGER FINGER RELEASE Left 08/10/2020   Procedure: RELEASE TRIGGER FINGER/A-1 PULLEY;  Surgeon: Molli Angelucci, MD;  Location: [facility] ORS;  Service: Orthopedics;  Laterality: Left;   ULNAR TUNNEL RELEASE Right 07/17/2023   Procedure: RELEASE, CUBITAL TUNNEL;  Surgeon: Molli Angelucci, MD;  Location: [facility]  SURGERY [facility];  Service: Orthopedics;  Laterality: Right;   URETERAL STENT PLACEMENT      Allergies  Allergen Reactions   Ivp Dye [Iodinated Contrast Media] Anaphylaxis and Other (See Comments)    Breathing difficulty and chest pain   Quinine Shortness Of Breath, Rash and Other (See Comments)    Other reaction(s): SHORTNESS OF BREATH   Amlodipine  Swelling    Other reaction(s): Edema of lower extremity   Aspirin Other (See Comments)    Stomach pain   Gabapentin     Other reaction(s): Dizziness   Glipizide Other (See Comments)    Other reaction(s): Chest pain   Hydrochlorothiazide Other (See Comments)    Other reaction(s): Impotence   Ibuprofen Other (See Comments)    Stomach pain  Other Reaction(s): ITCHING,WATERING EYES   Ioxaglate Other (See Comments)    Breathing difficulty and chest pain   Latex Itching    Gloves   Metformin And Related Diarrhea   Nsaids     Kidneys   Omeprazole  Other (See Comments)    Other reaction(s): FAILED THERAPY   Pregabalin Other (See Comments)    Other reaction(s): Dizziness   Doxycycline  Anxiety   Pioglitazone Diarrhea and Other (See Comments)    Other reaction(s): Diarrhea, Abdominal pain   Tape Rash    Some bandages and wraps have started causing rashes.  Unsure if it is the adhesive or the bandage material    07/20/2023   08/17/2023  Physical Exam: General: The patient is alert and oriented x3 in no acute distress.  Dermatology: The wounds to the anterior aspect of the leg seem to have healed.  There continues to be diffuse erythema and edema with superficial skin breakdown and serous drainage which continues.  Vascular: Edema noted right lower extremity.  Neurological: Grossly intact via light touch  Musculoskeletal Exam: No pedal deformities noted.  No crepitus to the leg.  Assessment/Plan of Care: 1.  Edema right lower extremity 2.  H/o DVT RLE 3.  Ulcer right leg and left anterior leg 4.  Cellulitis right leg  -Patient  evaluated.  Overall improvement since last week -continue Keflex  500 mg 3 times daily x 10 days.  Refill provided - Multi Unna boot compression wrap applied.  Leave clean dry and intact x 5 days.  After 5 days he may remove the wraps and resume dressing changes at home.  Patient has supplies at home -Return to clinic 10 days      Dot Gazella, DPM [facility]  Dr. Dot Gazella, DPM    [address] [place], [place] [address]                Office [phone]  Fax [phone]

## 2023-08-27 ENCOUNTER — Other Ambulatory Visit: Payer: Self-pay | Admitting: Family

## 2023-08-28 ENCOUNTER — Ambulatory Visit (INDEPENDENT_AMBULATORY_CARE_PROVIDER_SITE_OTHER): Admitting: Podiatry

## 2023-08-28 ENCOUNTER — Encounter: Payer: Self-pay | Admitting: Podiatry

## 2023-08-28 VITALS — Ht 72.0 in | Wt 169.0 lb

## 2023-08-28 DIAGNOSIS — R600 Localized edema: Secondary | ICD-10-CM

## 2023-08-28 NOTE — Progress Notes (Signed)
 Chief Complaint  Patient presents with   Wound Check    Pt is here to f/u on right leg due to edema, pt states area is healing well.    HPI: 78 y.o. male presenting today for follow-up evaluation of ulcers to the right lower extremity with cellulitis.   ulcers are healed.  Significant reduction of the edema and erythema to the leg.  He completed the oral Keflex  that was prescribed.  Past Medical History:  Diagnosis Date   Age-related nuclear cataract of both eyes 01/21/2022   Anxiety    Arthritis    Chronic kidney disease, stage 3a (HCC) 08/16/2022   Aug 16, 2022 Entered By: Ellie Gutta A Comment: b/l Cr ~2-2.2   Chronic pain syndrome    CKD (chronic kidney disease) stage 3, GFR 30-59 ml/min (HCC)    Depression    Diabetes mellitus without complication (HCC)    Type II   DVT (deep venous thrombosis) (HCC)    Left lower leg   Episodic tension-type headache, not intractable 08/06/2022   Exposure to potentially hazardous substance 08/16/2022   Jul 05, 2022 Entered By: Lourdes Roy Comment: Entered automatically through [facility] Problem List documentation program   GERD (gastroesophageal reflux disease)    Headache    Migraine- headache   Hiatal hernia    History of urinary stone 08/06/2022   Hyperlipidemia    Hypertension    Kidney stones    Neck stiffness    limited up and down motion   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, unspecified laterality 11/27/2019   Peptic ulcer    Sleep apnea    does not use cpap/ pt states he lost 60#   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) 07/17/2019   Status post laser cataract surgery of both eyes 05/10/2022   Unspecified glaucoma 08/06/2022   Wears hearing aid in both ears    Has, does not wear    Past Surgical History:  Procedure Laterality Date   ANTERIOR CERVICAL DECOMP/DISCECTOMY FUSION  2009   CARPAL TUNNEL RELEASE Left 01/03/2018   Procedure: CARPAL TUNNEL RELEASE;  Surgeon:  Molli Angelucci, MD;  Location: [facility] ORS;  Service: Orthopedics;  Laterality: Left;   CARPAL TUNNEL RELEASE Right 07/17/2023   Procedure: CARPAL TUNNEL RELEASE;  Surgeon: Molli Angelucci, MD;  Location: [facility];  Service: Orthopedics;  Laterality: Right;   CARPOMETACARPAL (CMC) FUSION OF THUMB Left 08/10/2020   Procedure: CARPOMETACARPAL [facility]) FUSION OF THUMB;  Surgeon: Molli Angelucci, MD;  Location: [facility] ORS;  Service: Orthopedics;  Laterality: Left;   COLONOSCOPY WITH PROPOFOL  N/A 11/21/2017   Procedure: COLONOSCOPY WITH PROPOFOL ;  Surgeon: Toledo, Alphonsus Jeans, MD;  Location: [facility] ENDOSCOPY;  Service: Gastroenterology;  Laterality: N/A;   CYSTOSCOPY     x3- 2 times for stone removal    ESOPHAGOGASTRODUODENOSCOPY N/A 10/14/2017   Procedure: ESOPHAGOGASTRODUODENOSCOPY (EGD);  Surgeon: Selena Daily, MD;  Location: [facility] ENDOSCOPY;  Service: Gastroenterology;  Laterality: N/A;   ESOPHAGOGASTRODUODENOSCOPY (EGD) WITH PROPOFOL  N/A 10/25/2017   Procedure: ESOPHAGOGASTRODUODENOSCOPY (EGD) WITH PROPOFOL ;  Surgeon: Selena Daily, MD;  Location: [facility] ENDOSCOPY;  Service: Gastroenterology;  Laterality: N/A;   JOINT REPLACEMENT     bil. knees , shoulder rt, and neck    REVERSE SHOULDER ARTHROPLASTY Right 12/01/2014   REVERSE SHOULDER ARTHROPLASTY Right 12/01/2014   Procedure: REVERSE SHOULDER ARTHROPLASTY;  Surgeon: Jasmine Mesi, MD;  Location: [facility] OR;  Service: Orthopedics;  Laterality: Right;   SHOULDER ARTHROSCOPY W/  ROTATOR CUFF REPAIR Right 06/10/2014   "failed"   SHOULDER OPEN ROTATOR CUFF REPAIR Left ~ 2008   TOTAL KNEE ARTHROPLASTY Bilateral 2005-2008   "right-left"   TRIGGER FINGER RELEASE Right 2001   TRIGGER FINGER RELEASE Left 08/10/2020   Procedure: RELEASE TRIGGER FINGER/A-1 PULLEY;  Surgeon: Molli Angelucci, MD;  Location: [facility] ORS;  Service: Orthopedics;  Laterality: Left;   ULNAR TUNNEL RELEASE Right 07/17/2023   Procedure: RELEASE, CUBITAL TUNNEL;  Surgeon: Molli Angelucci,  MD;  Location: [facility];  Service: Orthopedics;  Laterality: Right;   URETERAL STENT PLACEMENT      Allergies  Allergen Reactions   Ivp Dye [Iodinated Contrast Media] Anaphylaxis and Other (See Comments)    Breathing difficulty and chest pain   Quinine Shortness Of Breath, Rash and Other (See Comments)    Other reaction(s): SHORTNESS OF BREATH   Amlodipine  Swelling    Other reaction(s): Edema of lower extremity   Aspirin Other (See Comments)    Stomach pain   Gabapentin     Other reaction(s): Dizziness   Glipizide Other (See Comments)    Other reaction(s): Chest pain   Hydrochlorothiazide Other (See Comments)    Other reaction(s): Impotence   Ibuprofen Other (See Comments)    Stomach pain  Other Reaction(s): ITCHING,WATERING EYES   Ioxaglate Other (See Comments)    Breathing difficulty and chest pain   Latex Itching    Gloves   Metformin And Related Diarrhea   Nsaids     Kidneys   Omeprazole  Other (See Comments)    Other reaction(s): FAILED THERAPY   Pregabalin Other (See Comments)    Other reaction(s): Dizziness   Doxycycline  Anxiety   Pioglitazone Diarrhea and Other (See Comments)    Other reaction(s): Diarrhea, Abdominal pain   Tape Rash    Some bandages and wraps have started causing rashes.  Unsure if it is the adhesive or the bandage material    07/20/2023   08/17/2023   08/28/2023  Physical Exam: General: The patient is alert and oriented x3 in no acute distress.  Dermatology: Ulcer is healed.  No open wounds noted.  No drainage.  Significant reduction of the erythema and edema.  Vascular: Mild edema noted right lower extremity.  Neurological: Grossly intact via light touch  Musculoskeletal Exam: No pedal deformities noted.  No crepitus to the leg.  Assessment/Plan of Care: 1.  Edema right lower extremity 2.  H/o DVT RLE 3.  Ulcer right leg and left anterior leg 4.  Cellulitis right leg  -Patient evaluated.   -Completed the oral Keflex   500 mg TID x 10 days as prescribed without complication -Multilayer Unna boot compression wrap was applied to the right lower extremity.  Leave intact for 5-7 days.  After that he can remove the dressing and resume daily compression socks that he has at home -Recommend Aquaphor or Eucerin ointment to the legs daily -Return to clinic 6 weeks     Dot Gazella, DPM [facility]  Dr. Dot Gazella, DPM    [address] [place], [place] [address]                Office [phone]  Fax [phone]

## 2023-10-01 ENCOUNTER — Other Ambulatory Visit: Payer: Self-pay | Admitting: Family

## 2023-10-09 ENCOUNTER — Ambulatory Visit (INDEPENDENT_AMBULATORY_CARE_PROVIDER_SITE_OTHER): Admitting: Podiatry

## 2023-10-09 VITALS — Ht 72.0 in | Wt 169.0 lb

## 2023-10-09 DIAGNOSIS — M79674 Pain in right toe(s): Secondary | ICD-10-CM

## 2023-10-09 DIAGNOSIS — M79675 Pain in left toe(s): Secondary | ICD-10-CM | POA: Diagnosis not present

## 2023-10-09 DIAGNOSIS — L03115 Cellulitis of right lower limb: Secondary | ICD-10-CM

## 2023-10-09 DIAGNOSIS — B351 Tinea unguium: Secondary | ICD-10-CM | POA: Diagnosis not present

## 2023-10-09 NOTE — Progress Notes (Signed)
 Chief Complaint  Patient presents with   Wound Check    Pt is here to check on the wound on lower right leg, he states it looks great and better then before he would like to have his toenails trimmed as well.    HPI: 78 y.o. male presenting today for follow-up evaluation of ulcers to the right lower extremity with cellulitis.   ulcers are healed.  Overall he is doing very well and very satisfied with the results  Patient also requesting nail debridement today.  He says that his toenails are thick and elongated and he is unable to trim his own nails.  He has pain in close toed shoes.  Past Medical History:  Diagnosis Date   Age-related nuclear cataract of both eyes 01/21/2022   Anxiety    Arthritis    Chronic kidney disease, stage 3a (HCC) 08/16/2022   Aug 16, 2022 Entered By: RONNETTE MAXWELL A Comment: b/l Cr ~2-2.2   Chronic pain syndrome    CKD (chronic kidney disease) stage 3, GFR 30-59 ml/min (HCC)    Depression    Diabetes mellitus without complication (HCC)    Type II   DVT (deep venous thrombosis) (HCC)    Left lower leg   Episodic tension-type headache, not intractable 08/06/2022   Exposure to potentially hazardous substance 08/16/2022   Jul 05, 2022 Entered By: ROSANA DRAGON Comment: Entered automatically through [facility] Problem List documentation program   GERD (gastroesophageal reflux disease)    Headache    Migraine- headache   Hiatal hernia    History of urinary stone 08/06/2022   Hyperlipidemia    Hypertension    Kidney stones    Neck stiffness    limited up and down motion   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, unspecified laterality 11/27/2019   Peptic ulcer    Sleep apnea    does not use cpap/ pt states he lost 60#   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) 07/17/2019   Status post laser cataract surgery of both eyes 05/10/2022   Unspecified glaucoma 08/06/2022   Wears hearing aid in both ears    Has, does not  wear    Past Surgical History:  Procedure Laterality Date   ANTERIOR CERVICAL DECOMP/DISCECTOMY FUSION  2009   CARPAL TUNNEL RELEASE Left 01/03/2018   Procedure: CARPAL TUNNEL RELEASE;  Surgeon: Kathlynn Sharper, MD;  Location: [facility] ORS;  Service: Orthopedics;  Laterality: Left;   CARPAL TUNNEL RELEASE Right 07/17/2023   Procedure: CARPAL TUNNEL RELEASE;  Surgeon: Kathlynn Sharper, MD;  Location: [facility];  Service: Orthopedics;  Laterality: Right;   CARPOMETACARPAL (CMC) FUSION OF THUMB Left 08/10/2020   Procedure: CARPOMETACARPAL [facility]) FUSION OF THUMB;  Surgeon: Kathlynn Sharper, MD;  Location: [facility] ORS;  Service: Orthopedics;  Laterality: Left;   COLONOSCOPY WITH PROPOFOL  N/A 11/21/2017   Procedure: COLONOSCOPY WITH PROPOFOL ;  Surgeon: Toledo, Ladell POUR, MD;  Location: [facility] ENDOSCOPY;  Service: Gastroenterology;  Laterality: N/A;   CYSTOSCOPY     x3- 2 times for stone removal    ESOPHAGOGASTRODUODENOSCOPY N/A 10/14/2017   Procedure: ESOPHAGOGASTRODUODENOSCOPY (EGD);  Surgeon: Unk Corinn Skiff, MD;  Location: [facility] ENDOSCOPY;  Service: Gastroenterology;  Laterality: N/A;   ESOPHAGOGASTRODUODENOSCOPY (EGD) WITH PROPOFOL  N/A 10/25/2017   Procedure: ESOPHAGOGASTRODUODENOSCOPY (EGD) WITH PROPOFOL ;  Surgeon: Unk Corinn Skiff, MD;  Location: [facility] ENDOSCOPY;  Service: Gastroenterology;  Laterality: N/A;   JOINT REPLACEMENT     bil. knees , shoulder rt, and neck  REVERSE SHOULDER ARTHROPLASTY Right 12/01/2014   REVERSE SHOULDER ARTHROPLASTY Right 12/01/2014   Procedure: REVERSE SHOULDER ARTHROPLASTY;  Surgeon: Glendia Cordella Hutchinson, MD;  Location: [facility] OR;  Service: Orthopedics;  Laterality: Right;   SHOULDER ARTHROSCOPY W/ ROTATOR CUFF REPAIR Right 06/10/2014   failed   SHOULDER OPEN ROTATOR CUFF REPAIR Left ~ 2008   TOTAL KNEE ARTHROPLASTY Bilateral 2005-2008   right-left   TRIGGER FINGER RELEASE Right 2001   TRIGGER FINGER RELEASE Left 08/10/2020   Procedure: RELEASE TRIGGER FINGER/A-1  PULLEY;  Surgeon: Kathlynn Sharper, MD;  Location: [facility] ORS;  Service: Orthopedics;  Laterality: Left;   ULNAR TUNNEL RELEASE Right 07/17/2023   Procedure: RELEASE, CUBITAL TUNNEL;  Surgeon: Kathlynn Sharper, MD;  Location: [facility];  Service: Orthopedics;  Laterality: Right;   URETERAL STENT PLACEMENT      Allergies  Allergen Reactions   Ivp Dye [Iodinated Contrast Media] Anaphylaxis and Other (See Comments)    Breathing difficulty and chest pain   Quinine Shortness Of Breath, Rash and Other (See Comments)    Other reaction(s): SHORTNESS OF BREATH   Amlodipine  Swelling    Other reaction(s): Edema of lower extremity   Aspirin Other (See Comments)    Stomach pain   Gabapentin     Other reaction(s): Dizziness   Glipizide Other (See Comments)    Other reaction(s): Chest pain   Hydrochlorothiazide Other (See Comments)    Other reaction(s): Impotence   Ibuprofen Other (See Comments)    Stomach pain  Other Reaction(s): ITCHING,WATERING EYES   Ioxaglate Other (See Comments)    Breathing difficulty and chest pain   Latex Itching    Gloves   Metformin And Related Diarrhea   Nsaids     Kidneys   Omeprazole  Other (See Comments)    Other reaction(s): FAILED THERAPY   Pregabalin Other (See Comments)    Other reaction(s): Dizziness   Doxycycline  Anxiety   Pioglitazone Diarrhea and Other (See Comments)    Other reaction(s): Diarrhea, Abdominal pain   Tape Rash    Some bandages and wraps have started causing rashes.  Unsure if it is the adhesive or the bandage material    07/20/2023   08/17/2023   08/28/2023  Physical Exam: General: The patient is alert and oriented x3 in no acute distress.  Dermatology: Ulcer is healed.  No open wounds noted.  No drainage.  Significant reduction of the erythema and edema.  Vascular: Mild edema noted right lower extremity.  Neurological: Grossly intact via light touch  Musculoskeletal Exam: No pedal deformities noted.  No crepitus to the  leg.  Assessment/Plan of Care: 1.  Edema right lower extremity 2.  H/o DVT RLE 3.  Ulcer right leg and left anterior leg 4.  Cellulitis right leg  -Patient evaluated.   - The wounds to the leg have completely healed.  There continues to be some mild to moderate edema compared to the contralateral limb but complete resolution of the ulcers and cellulitis.   -Continue Aquaphor or Eucerin ointment to the legs daily -Continue daily compression stockings -Mechanical debridement of nails 1-5 bilateral was performed using a nail nipper without incident or bleeding -Return to clinic PRN     Thresa EMERSON Sar, DPM [facility]  Dr. Thresa EMERSON Sar, DPM    [address]  [place], [place] [address]                Office [phone]  Fax [phone]

## 2023-10-21 ENCOUNTER — Encounter: Payer: Self-pay | Admitting: Family

## 2023-10-21 NOTE — Assessment & Plan Note (Signed)
 Continue current diabetes POC, as patient has been well controlled on current regimen.  Will adjust meds if needed based on labs.

## 2023-10-21 NOTE — Progress Notes (Signed)
 Established Patient Office Visit  Subjective:  Patient ID: Xavier Hebert, male    DOB: 03/28/1945  Age: 78 y.o. MRN: [id number]  Chief Complaint  Patient presents with   Follow-up    4 month follow up    Patient is here today for his 4 months follow up.  He has been feeling fairly well since last appointment.   He does not have additional concerns to discuss today.  Labs are not due today.  He needs refills.   I have reviewed his active problem list, medication list, allergies, notes from last encounter, lab results for his appointment today.      No other concerns at this time.   Past Medical History:  Diagnosis Date   Age-related nuclear cataract of both eyes 01/21/2022   Anxiety    Arthritis    Chronic kidney disease, stage 3a (HCC) 08/16/2022   Aug 16, 2022 Entered By: RONNETTE MAXWELL A Comment: b/l Cr ~2-2.2   Chronic pain syndrome    CKD (chronic kidney disease) stage 3, GFR 30-59 ml/min (HCC)    Depression    Diabetes mellitus without complication (HCC)    Type II   DVT (deep venous thrombosis) (HCC)    Left lower leg   Episodic tension-type headache, not intractable 08/06/2022   Exposure to potentially hazardous substance 08/16/2022   Jul 05, 2022 Entered By: ROSANA DRAGON Comment: Entered automatically through [facility]ES Problem List documentation program   GERD (gastroesophageal reflux disease)    Headache    Migraine- headache   Hiatal hernia    History of urinary stone 08/06/2022   Hyperlipidemia    Hypertension    Kidney stones    Neck stiffness    limited up and down motion   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, unspecified laterality 11/27/2019   Peptic ulcer    Sleep apnea    does not use cpap/ pt states he lost 60#   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) 07/17/2019   Status post laser cataract surgery of both eyes 05/10/2022   Unspecified glaucoma 08/06/2022   Wears hearing aid in both ears     Has, does not wear    Past Surgical History:  Procedure Laterality Date   ANTERIOR CERVICAL DECOMP/DISCECTOMY FUSION  2009   CARPAL TUNNEL RELEASE Left 01/03/2018   Procedure: CARPAL TUNNEL RELEASE;  Surgeon: Kathlynn Sharper, MD;  Location: [facility] ORS;  Service: Orthopedics;  Laterality: Left;   CARPAL TUNNEL RELEASE Right 07/17/2023   Procedure: CARPAL TUNNEL RELEASE;  Surgeon: Kathlynn Sharper, MD;  Location: [facility];  Service: Orthopedics;  Laterality: Right;   CARPOMETACARPAL (CMC) FUSION OF THUMB Left 08/10/2020   Procedure: CARPOMETACARPAL [facility]) FUSION OF THUMB;  Surgeon: Kathlynn Sharper, MD;  Location: [facility] ORS;  Service: Orthopedics;  Laterality: Left;   COLONOSCOPY WITH PROPOFOL  N/A 11/21/2017   Procedure: COLONOSCOPY WITH PROPOFOL ;  Surgeon: Toledo, Ladell POUR, MD;  Location: [facility] ENDOSCOPY;  Service: Gastroenterology;  Laterality: N/A;   CYSTOSCOPY     x3- 2 times for stone removal    ESOPHAGOGASTRODUODENOSCOPY N/A 10/14/2017   Procedure: ESOPHAGOGASTRODUODENOSCOPY (EGD);  Surgeon: Unk Corinn Skiff, MD;  Location[facility] ENDOSCOPY;  Service: Gastroenterology;  Laterality: N/A;   ESOPHAGOGASTRODUODENOSCOPY (EGD) WITH PROPOFOL  N/A 10/25/2017   Procedure: ESOPHAGOGASTRODUODENOSCOPY (EGD) WITH PROPOFOL ;  Surgeon: Unk Corinn Skiff, MD;  Location: [facility] ENDOSCOPY;  Service: Gastroenterology;  Laterality: N/A;   JOINT REPLACEMENT     bil. knees , shoulder rt, and  neck    REVERSE SHOULDER ARTHROPLASTY Right 12/01/2014   REVERSE SHOULDER ARTHROPLASTY Right 12/01/2014   Procedure: REVERSE SHOULDER ARTHROPLASTY;  Surgeon: Glendia Cordella Hutchinson, MD;  Location: [facility] OR;  Service: Orthopedics;  Laterality: Right;   SHOULDER ARTHROSCOPY W/ ROTATOR CUFF REPAIR Right 06/10/2014   failed   SHOULDER OPEN ROTATOR CUFF REPAIR Left ~ 2008   TOTAL KNEE ARTHROPLASTY Bilateral 2005-2008   right-left   TRIGGER FINGER RELEASE Right 2001   TRIGGER FINGER RELEASE Left 08/10/2020   Procedure: RELEASE TRIGGER  FINGER/A-1 PULLEY;  Surgeon: Kathlynn Sharper, MD;  Location: [facility] ORS;  Service: Orthopedics;  Laterality: Left;   ULNAR TUNNEL RELEASE Right 07/17/2023   Procedure: RELEASE, CUBITAL TUNNEL;  Surgeon: Kathlynn Sharper, MD;  Location: [facility]ERY [facility];  Service: Orthopedics;  Laterality: Right;   URETERAL STENT PLACEMENT      Social History   Socioeconomic History   Marital status: Married    Spouse name: Not on file   Number of children: Not on file   Years of education: Not on file   Highest education level: Not on file  Occupational History   Not on file  Tobacco Use   Smoking status: Never   Smokeless tobacco: Never  Vaping Use   Vaping status: Never Used  Substance and Sexual Activity   Alcohol use: Never   Drug use: No   Sexual activity: Not Currently  Other Topics Concern   Not on file  Social History Narrative   Lives at home with his wife. Independent at baseline.   Social Drivers of Corporate investment banker Strain: Low Risk  (05/31/2023)   Received from [facility]   Overall Financial Resource Strain (CARDIA)    Difficulty of Paying Living Expenses: Not hard at all  Food Insecurity: No Food Insecurity (05/31/2023)   Received from [facility]   Hunger Vital Sign    Within the past 12 months, you worried that your food would run out before you got the money to buy more.: Never true    Within the past 12 months, the food you bought just didn't last and you didn't have money to get more.: Never true  Transportation Needs: No Transportation Needs (05/31/2023)   Received from [facility]    In the past 12 months, has lack of transportation kept you from medical appointments or from getting medications?: No    Lack of Transportation (Non-Medical): No  Physical Activity: Not on file  Stress: Not on file  Social Connections: Not on file  Intimate Partner Violence: Not on file    Family  History  Problem Relation Age of Onset   Dementia Mother    Alcohol abuse Father     Allergies  Allergen Reactions   Ivp Dye [Iodinated Contrast Media] Anaphylaxis and Other (See Comments)    Breathing difficulty and chest pain   Quinine Shortness Of Breath, Rash and Other (See Comments)    Other reaction(s): SHORTNESS OF BREATH   Amlodipine  Swelling    Other reaction(s): Edema of lower extremity   Aspirin Other (See Comments)    Stomach pain   Gabapentin     Other reaction(s): Dizziness   Glipizide Other (See Comments)    Other reaction(s): Chest pain   Hydrochlorothiazide Other (See Comments)    Other reaction(s): Impotence   Ibuprofen Other (See Comments)    Stomach pain  Other Reaction(s): ITCHING,WATERING EYES   Ioxaglate Other (  See Comments)    Breathing difficulty and chest pain   Latex Itching    Gloves   Metformin And Related Diarrhea   Nsaids     Kidneys   Omeprazole  Other (See Comments)    Other reaction(s): FAILED THERAPY   Pregabalin Other (See Comments)    Other reaction(s): Dizziness   Doxycycline  Anxiety   Pioglitazone Diarrhea and Other (See Comments)    Other reaction(s): Diarrhea, Abdominal pain   Tape Rash    Some bandages and wraps have started causing rashes.  Unsure if it is the adhesive or the bandage material    Review of Systems  All other systems reviewed and are negative.      Objective:   BP 110/62   Pulse 85   Ht 6' (1.829 m)   Wt 169 lb (76.7 kg)   SpO2 96%   BMI 22.92 kg/m   Vitals:   08/07/23 1410  BP: 110/62  Pulse: 85  Height: 6' (1.829 m)  Weight: 169 lb (76.7 kg)  SpO2: 96%  BMI (Calculated): 22.92    Physical Exam Vitals and nursing note reviewed.  Constitutional:      Appearance: Normal appearance. He is normal weight.  Eyes:     Pupils: Pupils are equal, round, and reactive to light.  Cardiovascular:     Rate and Rhythm: Normal rate and regular rhythm.     Pulses: Normal pulses.     Heart sounds:  Normal heart sounds.  Pulmonary:     Effort: Pulmonary effort is normal.     Breath sounds: Normal breath sounds.  Neurological:     General: No focal deficit present.     Mental Status: He is alert and oriented to person, place, and time.  Psychiatric:        Mood and Affect: Mood normal.        Behavior: Behavior normal.        Thought Content: Thought content normal.        Judgment: Judgment normal.      Results for orders placed or performed in visit on 08/07/23  Microalbumin / creatinine urine ratio  Result Value Ref Range   Microalb Creat Ratio 259   POCT UA - Microalbumin  Result Value Ref Range   Microalbumin Ur, POC 150 mg/L   Creatinine, POC 100 mg/dL   Albumin /Creatinine Ratio, Urine, POC >300     Recent Results (from the past 2160 hours)  POCT UA - Microalbumin     Status: Abnormal   Collection Time: 08/07/23  3:42 PM  Result Value Ref Range   Microalbumin Ur, POC 150 mg/L   Creatinine, POC 100 mg/dL   Albumin /Creatinine Ratio, Urine, POC >300        Assessment & Plan Chronic kidney disease, stage 4 (severe) (HCC) Patient is seen by nephrology, who manage this condition.  He is well controlled with current therapy.   Will defer to them for further changes to plan of care.  Type 2 diabetes mellitus with stage 4 chronic kidney disease, with long-term current use of insulin  (HCC) Continue current diabetes POC, as patient has been well controlled on current regimen.  Will adjust meds if needed based on labs.   B12 deficiency due to diet B12 injection given in office today.  Return for next injection per provider instructions.     Return in about 4 months (around 12/08/2023).   Total time spent: 20 minutes  ALAN CHRISTELLA ARRANT, FNP  08/07/2023  This document may have been prepared by [employer] Voice Recognition software and as such may include unintentional dictation errors.

## 2023-10-21 NOTE — Assessment & Plan Note (Signed)
 B12 injection given in office today.  Return for next injection per provider instructions.

## 2023-10-21 NOTE — Assessment & Plan Note (Signed)
 Patient is seen by nephrology, who manage this condition.  He is well controlled with current therapy.   Will defer to them for further changes to plan of care.

## 2023-10-25 ENCOUNTER — Other Ambulatory Visit: Payer: Self-pay

## 2023-10-25 ENCOUNTER — Observation Stay
Admission: EM | Admit: 2023-10-25 | Discharge: 2023-10-27 | Disposition: A | Discharge status: Home / Self Care | Attending: Emergency Medicine | Admitting: Emergency Medicine

## 2023-10-25 ENCOUNTER — Emergency Department

## 2023-10-25 DIAGNOSIS — R791 Abnormal coagulation profile: Secondary | ICD-10-CM | POA: Diagnosis not present

## 2023-10-25 DIAGNOSIS — D631 Anemia in chronic kidney disease: Secondary | ICD-10-CM | POA: Insufficient documentation

## 2023-10-25 DIAGNOSIS — Z86718 Personal history of other venous thrombosis and embolism: Secondary | ICD-10-CM | POA: Insufficient documentation

## 2023-10-25 DIAGNOSIS — R55 Syncope and collapse: Secondary | ICD-10-CM | POA: Insufficient documentation

## 2023-10-25 DIAGNOSIS — K922 Gastrointestinal hemorrhage, unspecified: Secondary | ICD-10-CM | POA: Diagnosis not present

## 2023-10-25 DIAGNOSIS — I471 Supraventricular tachycardia, unspecified: Secondary | ICD-10-CM | POA: Insufficient documentation

## 2023-10-25 DIAGNOSIS — D649 Anemia, unspecified: Principal | ICD-10-CM | POA: Diagnosis present

## 2023-10-25 DIAGNOSIS — I129 Hypertensive chronic kidney disease with stage 1 through stage 4 chronic kidney disease, or unspecified chronic kidney disease: Secondary | ICD-10-CM | POA: Insufficient documentation

## 2023-10-25 DIAGNOSIS — E1122 Type 2 diabetes mellitus with diabetic chronic kidney disease: Secondary | ICD-10-CM | POA: Diagnosis not present

## 2023-10-25 DIAGNOSIS — R7989 Other specified abnormal findings of blood chemistry: Secondary | ICD-10-CM | POA: Insufficient documentation

## 2023-10-25 DIAGNOSIS — N184 Chronic kidney disease, stage 4 (severe): Secondary | ICD-10-CM | POA: Insufficient documentation

## 2023-10-25 DIAGNOSIS — R079 Chest pain, unspecified: Secondary | ICD-10-CM

## 2023-10-25 DIAGNOSIS — D61818 Other pancytopenia: Secondary | ICD-10-CM | POA: Diagnosis not present

## 2023-10-25 DIAGNOSIS — F17220 Nicotine dependence, chewing tobacco, uncomplicated: Secondary | ICD-10-CM | POA: Insufficient documentation

## 2023-10-25 DIAGNOSIS — Z91040 Latex allergy status: Secondary | ICD-10-CM | POA: Insufficient documentation

## 2023-10-25 MED ORDER — SODIUM CHLORIDE 0.9 % IV BOLUS (SEPSIS)
1000.0000 mL | Freq: Once | INTRAVENOUS | Status: AC
  Administered 2023-10-26: 1000 mL via INTRAVENOUS

## 2023-10-25 NOTE — ED Provider Notes (Signed)
 [facility] Provider Note    Event Date/Time   First MD Initiated Contact with Patient 10/25/23 2301     (approximate)   History   Chest Pain   HPI  TURNER BAILLIE is a 78 y.o. male with history of chronic kidney disease, chronic pain, hypertension, hyperlipidemia, DVT on Eliquis who presents to the emergency department with dizziness.  Patient states he felt very lightheaded and felt like he was going to pass out.  Try to check his blood pressure at home and his blood pressure cuff would not measure his blood pressure.  With EMS patient was in SVT with a heart rate in the 170s.  He converted to a sinus rhythm after vagal maneuvers.  No prior history of arrhythmia.  He states he was having chest pressure which is now resolved.  No current shortness of breath.  No recent vomiting, diarrhea, bloody stools or melena.   History provided by patient, wife, daughter.      Past Medical History:  Diagnosis Date   Age-related nuclear cataract of both eyes 01/21/2022   Anxiety    Arthritis    Chronic kidney disease, stage 3a (HCC) 08/16/2022   Aug 16, 2022 Entered By: RONNETTE MAXWELL A Comment: b/l Cr ~2-2.2   Chronic pain syndrome    CKD (chronic kidney disease) stage 3, GFR 30-59 ml/min (HCC)    Depression    Diabetes mellitus without complication (HCC)    Type II   DVT (deep venous thrombosis) (HCC)    Left lower leg   Episodic tension-type headache, not intractable 08/06/2022   Exposure to potentially hazardous substance 08/16/2022   Jul 05, 2022 Entered By: ROSANA DRAGON Comment: Entered automatically through [facility] Problem List documentation program   GERD (gastroesophageal reflux disease)    Headache    Migraine- headache   Hiatal hernia    History of urinary stone 08/06/2022   Hyperlipidemia    Hypertension    Kidney stones    Neck stiffness    limited up and down motion   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, unspecified  laterality 11/27/2019   Peptic ulcer    Sleep apnea    does not use cpap/ pt states he lost 60#   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) 07/17/2019   Status post laser cataract surgery of both eyes 05/10/2022   Unspecified glaucoma 08/06/2022   Wears hearing aid in both ears    Has, does not wear    Past Surgical History:  Procedure Laterality Date   ANTERIOR CERVICAL DECOMP/DISCECTOMY FUSION  2009   CARPAL TUNNEL RELEASE Left 01/03/2018   Procedure: CARPAL TUNNEL RELEASE;  Surgeon: Kathlynn Sharper, MD;  Location: [facility];  Service: Orthopedics;  Laterality: Left;   CARPAL TUNNEL RELEASE Right 07/17/2023   Procedure: CARPAL TUNNEL RELEASE;  Surgeon: Kathlynn Sharper, MD;  Location: [facility];  Service: Orthopedics;  Laterality: Right;   CARPOMETACARPAL (CMC) FUSION OF THUMB Left 08/10/2020   Procedure: CARPOMETACARPAL [facility]) FUSION OF THUMB;  Surgeon: Kathlynn Sharper, MD;  Location: [facility];  Service: Orthopedics;  Laterality: Left;   COLONOSCOPY WITH PROPOFOL  N/A 11/21/2017   Procedure: COLONOSCOPY WITH PROPOFOL ;  Surgeon: Toledo, Ladell POUR, MD;  Location: [facility] ENDOSCOPY;  Service: Gastroenterology;  Laterality: N/A;   CYSTOSCOPY     x3- 2 times for stone removal    ESOPHAGOGASTRODUODENOSCOPY N/A 10/14/2017   Procedure: ESOPHAGOGASTRODUODENOSCOPY (EGD);  Surgeon: Unk Corinn Skiff, MD;  Location: [facility]  ENDOSCOPY;  Service: Gastroenterology;  Laterality: N/A;   ESOPHAGOGASTRODUODENOSCOPY (EGD) WITH PROPOFOL  N/A 10/25/2017   Procedure: ESOPHAGOGASTRODUODENOSCOPY (EGD) WITH PROPOFOL ;  Surgeon: Unk Corinn Skiff, MD;  Location: [facility];  Service: Gastroenterology;  Laterality: N/A;   JOINT REPLACEMENT     bil. knees , shoulder rt, and neck    REVERSE SHOULDER ARTHROPLASTY Right 12/01/2014   REVERSE SHOULDER ARTHROPLASTY Right 12/01/2014   Procedure: REVERSE SHOULDER ARTHROPLASTY;  Surgeon: Glendia Cordella Hutchinson, MD;  Location: [facility];  Service: Orthopedics;   Laterality: Right;   SHOULDER ARTHROSCOPY W/ ROTATOR CUFF REPAIR Right 06/10/2014   failed   SHOULDER OPEN ROTATOR CUFF REPAIR Left ~ 2008   TOTAL KNEE ARTHROPLASTY Bilateral 2005-2008   right-left   TRIGGER FINGER RELEASE Right 2001   TRIGGER FINGER RELEASE Left 08/10/2020   Procedure: RELEASE TRIGGER FINGER/A-1 PULLEY;  Surgeon: Kathlynn Sharper, MD;  Location: [facility];  Service: Orthopedics;  Laterality: Left;   ULNAR TUNNEL RELEASE Right 07/17/2023   Procedure: RELEASE, CUBITAL TUNNEL;  Surgeon: Kathlynn Sharper, MD;  Location: [facility];  Service: Orthopedics;  Laterality: Right;   URETERAL STENT PLACEMENT      MEDICATIONS:  Prior to Admission medications   Medication Sig Start Date End Date Taking? Authorizing Provider  acetaminophen  (TYLENOL ) 325 MG tablet Take 650 mg by mouth every 6 (six) hours as needed for mild pain (pain score 1-3).    [provider]  allopurinol  (ZYLOPRIM ) 100 MG tablet Take 100 mg by mouth daily.  07/11/17   [provider]  Apixaban (ELIQUIS PO) Take 5 mg by mouth 2 (two) times daily.    [provider]  butalbital -acetaminophen -caffeine  (FIORICET , ESGIC ) 50-325-40 MG per tablet Take 1-2 tablets by mouth See admin instructions. Take 2 tablets by mouth at start of headache and take 1 additional tablet by mouth after 2 hours if needed - max 3 tablets daily    [provider]  cephALEXin  (KEFLEX ) 500 MG capsule Take 1 capsule (500 mg total) by mouth 3 (three) times daily. 08/17/23   Janit Thresa HERO, DPM  cholecalciferol  (VITAMIN D ) 1000 units tablet Take 5,000 Units by mouth daily.    [provider]  clobetasol cream (TEMOVATE) 0.05 % Apply 1 application topically daily as needed (eczema). Apply to affected area(s) 2 to 3 times weekly as needed    [provider]  Coconut Oil 1000 MG CAPS Take 2,000 mg by mouth 2 (two) times daily.    [provider]  colchicine  0.6 MG tablet TAKE 1 TABLET BY  MOUTH DAILY 11/16/21   Janit Thresa HERO, DPM  Docusate Sodium  (COLACE PO) Take by mouth 4 (four) times daily.    [provider]  doxycycline  (VIBRA -TABS) 100 MG tablet Take 1 tablet (100 mg total) by mouth 2 (two) times daily. 02/06/23   Janit Thresa HERO, DPM  empagliflozin  (JARDIANCE ) 25 MG TABS tablet Take 12.5 mg by mouth daily.    [provider]  flunisolide (NASALIDE) 25 MCG/ACT (0.025%) SOLN PLACE 1 SPRAY INTO EACH NOSTRIL TWICE DAILY 02/10/23   Shirley, Amanda M, FNP  hydrOXYzine  (ATARAX ) 50 MG tablet TAKE 1 TABLET BY MOUTH EVERY 4 TO 6 HOURS AS NEEDED 10/03/23   Orlean Alan HERO, FNP  ketoconazole (NIZORAL) 2 % shampoo Apply 1 application topically once a week.  11/10/14   [provider]  L-Tryptophan  500 MG CAPS Take 500 mg by mouth at bedtime.    [provider]  latanoprost  (XALATAN ) 0.005 % ophthalmic solution  Place 1 drop into both eyes at bedtime.    [provider]  lidocaine  (LIDODERM ) 5 % Place 1 patch onto the skin every 12 (twelve) hours. Remove & Discard patch within 12 hours or as directed by MD 05/19/22   Orlean Alan HERO, FNP  lisinopril  (PRINIVIL ,ZESTRIL ) 40 MG tablet Take 40 mg by mouth daily.    [provider]  magnesium  oxide (MAG-OX) 400 MG tablet Take 400 mg by mouth daily.    [provider]  methocarbamol  (ROBAXIN ) 750 MG tablet TAKE 1 TABLET BY MOUTH 4 TIMES DAILY 08/27/23   Orlean Alan HERO, FNP  Misc Natural Products (OSTEO BI-FLEX ADV TRIPLE ST) TABS Take 1 tablet by mouth in the morning and at bedtime.    [provider]  Multiple Vitamin (MULTIVITAMIN) tablet Take 1 tablet by mouth daily.    [provider]  naloxone  (NARCAN ) nasal spray 4 mg/0.1 mL Place 1 spray into the nose.    [provider]  oxyCODONE  (ROXICODONE ) 15 MG immediate release tablet Take 15 mg by mouth every 6 (six) hours. Takes 5 times daily (per MD)    [provider]  oxyCODONE  (ROXICODONE ) 5 MG  immediate release tablet Take 1-2 tablets (5-10 mg total) by mouth every 6 (six) hours as needed for moderate pain (pain score 4-6). 07/17/23 07/16/24  Kathlynn Sharper, MD  pantoprazole  (PROTONIX ) 40 MG tablet Take 40 mg by mouth 2 (two) times daily.     [provider]  PAPAV-PHENTOLAMINE-ALPROSTADIL IC 7 mLs by Intracavernosal route as needed. Super Trimix -injection    [provider]  predniSONE  (DELTASONE ) 10 MG tablet Take 2 tablets (20 mg total) by mouth daily with breakfast. 08/16/22   Orlean Alan HERO, FNP  rosuvastatin  (CRESTOR ) 20 MG tablet Take 10 mg by mouth at bedtime.     [provider]  Saw Palmetto  450 MG CAPS Take 900 mg by mouth in the morning and at bedtime.    [provider]  Semaglutide, 1 MG/DOSE, 4 MG/3ML SOPN Inject 1 mg into the skin once a week. 05/29/22   [provider]  sennosides-docusate sodium  (SENOKOT-S) 8.6-50 MG tablet Take 1 tablet by mouth 4 (four) times daily.    [provider]  sildenafil (VIAGRA) 100 MG tablet Take 100 mg by mouth daily as needed (ED).    [provider]  silver  sulfADIAZINE  (SILVADENE ) 1 % cream Apply 1 application topically daily. Patient taking differently: Apply 1 application  topically daily as needed. 07/15/19   Janit Thresa HERO, DPM  tamsulosin  (FLOMAX ) 0.4 MG CAPS capsule Take 0.4 mg by mouth.    [provider]  Testosterone  20 % CREA Apply 1 Application topically 2 (two) times daily. 04/06/23   Orlean Alan HERO, FNP    Physical Exam   Triage Vital Signs: ED Triage Vitals  Encounter Vitals Group     BP 10/25/23 2231 (!) 94/58     Girls Systolic BP Percentile --      Girls Diastolic BP Percentile --      Boys Systolic BP Percentile --      Boys Diastolic BP Percentile --      Pulse Rate 10/25/23 2231 96     Resp 10/25/23 2231 18     Temp 10/25/23 2231 98.3 F (36.8 C)     Temp Source 10/25/23 2231 Oral     SpO2 10/25/23 2231 100 %     Weight --       Height --  Head Circumference --      Peak Flow --      Pain Score 10/25/23 2233 0     Pain Loc --      Pain Education --      Exclude from Growth Chart --     Most recent vital signs: Vitals:   10/26/23 0000 10/26/23 0414  BP: 115/69   Pulse: (!) 102   Resp: (!) 21   Temp:  97.7 F (36.5 C)  SpO2: 97%     CONSTITUTIONAL: Alert, responds appropriately to questions.  Elderly, chronically ill-appearing HEAD: Normocephalic, atraumatic EYES: Conjunctivae clear, pupils appear equal, sclera nonicteric ENT: normal nose; moist mucous membranes NECK: Supple, normal ROM CARD: RRR; S1 and S2 appreciated RESP: Normal chest excursion without splinting or tachypnea; breath sounds clear and equal bilaterally; no wheezes, no rhonchi, no rales, no hypoxia or respiratory distress, speaking full sentences ABD/GI: Non-distended; soft, non-tender, no rebound, no guarding, no peritoneal signs RECTAL:  Normal rectal tone, no gross blood or melena, guaiac POSITIVE, no hemorrhoids appreciated, nontender rectal exam, no fecal impaction.  Stool light brown in color. BACK: The back appears normal EXT: Normal ROM in all joints; no deformity noted, no edema, no calf tenderness or calf swelling SKIN: Normal color for age and race; warm; no rash on exposed skin NEURO: Moves all extremities equally, normal speech PSYCH: The patient's mood and manner are appropriate.   ED Results / Procedures / Treatments   LABS: (all labs ordered are listed, but only abnormal results are displayed) Labs Reviewed  CBC WITH DIFFERENTIAL/PLATELET - Abnormal; Notable for the following components:      Result Value   WBC 3.5 (*)    RBC 2.55 (*)    Hemoglobin 8.5 (*)    HCT 26.2 (*)    MCV 102.7 (*)    Platelets 132 (*)    Lymphs Abs 0.6 (*)    All other components within normal limits  COMPREHENSIVE METABOLIC PANEL WITH GFR - Abnormal; Notable for the following components:   Chloride 113 (*)    CO2 20 (*)     Glucose, Bld 160 (*)    BUN 59 (*)    Creatinine, Ser 2.64 (*)    Calcium  8.6 (*)    Total Protein 6.4 (*)    GFR, Estimated 24 (*)    All other components within normal limits  URINALYSIS, W/ REFLEX TO CULTURE (INFECTION SUSPECTED) - Abnormal; Notable for the following components:   Color, Urine YELLOW (*)    APPearance CLEAR (*)    Glucose, UA 150 (*)    All other components within normal limits  D-DIMER, QUANTITATIVE - Abnormal; Notable for the following components:   D-Dimer, Quant 0.66 (*)    All other components within normal limits  PROTIME-INR - Abnormal; Notable for the following components:   Prothrombin Time 16.6 (*)    INR 1.3 (*)    All other components within normal limits  TROPONIN I (HIGH SENSITIVITY) - Abnormal; Notable for the following components:   Troponin I (High Sensitivity) 28 (*)    All other components within normal limits  MAGNESIUM   TSH  BASIC METABOLIC PANEL WITH GFR  CBC  TYPE AND SCREEN  TROPONIN I (HIGH SENSITIVITY)     EKG:  EKG Interpretation Date/Time:  Thursday October 25 2023 22:30:47 EDT Ventricular Rate:  92 PR Interval:  197 QRS Duration:  99 QT Interval:  353 QTC Calculation: 437 R Axis:   -13  Text Interpretation:  Sinus or ectopic atrial rhythm Low voltage, precordial leads Abnormal R-wave progression, early transition Confirmed by Neomi Neptune [phone]) on 10/25/2023 11:07:04 PM         RADIOLOGY: My personal review and interpretation of imaging: Chest x-ray clear.  I have personally reviewed all radiology reports.   DG Chest Portable 1 View Result Date: 10/25/2023 CLINICAL DATA:  Chest pressure and shortness of breath EXAM: PORTABLE CHEST 1 VIEW COMPARISON:  10/13/2017 FINDINGS: Stable cardiomediastinal silhouette. Aortic atherosclerotic calcification. No focal consolidation, pleural effusion, or pneumothorax. No displaced rib fractures. Right reverse TSA. IMPRESSION: No active disease. Electronically Signed   By: Norman Gatlin M.D.   On: 10/25/2023 23:25     PROCEDURES:  Critical Care performed: Yes, see critical care procedure note(s)   CRITICAL CARE Performed by: Neptune Ayyan Sites   Total critical care time: 30 minutes  Critical care time was exclusive of separately billable procedures and treating other patients.  Critical care was necessary to treat or prevent imminent or life-threatening deterioration.  Critical care was time spent personally by me on the following activities: development of treatment plan with patient and/or surrogate as well as nursing, discussions with consultants, evaluation of patient's response to treatment, examination of patient, obtaining history from patient or surrogate, ordering and performing treatments and interventions, ordering and review of laboratory studies, ordering and review of radiographic studies, pulse oximetry and re-evaluation of patient's condition.   SABRA1-3 Lead EKG Interpretation  Performed by: Nastacia Raybuck, Neptune SAILOR, DO Authorized by: Jimia Gentles, Neptune SAILOR, DO     Interpretation: abnormal     ECG rate:  102   ECG rate assessment: tachycardic     Rhythm: sinus tachycardia     Ectopy: none     Conduction: normal       IMPRESSION / MDM / ASSESSMENT AND PLAN / ED COURSE  I reviewed the triage vital signs and the nursing notes.    Patient here with chest pressure, lightheadedness, found to be in SVT that resolved after vagal maneuvers.  The patient is on the cardiac monitor to evaluate for evidence of arrhythmia and/or significant heart rate changes.   DIFFERENTIAL DIAGNOSIS (includes but not limited to):   ACS, arrhythmia, thyroid dysfunction, anemia, electrolyte derangement, dehydration, sepsis   Patient's presentation is most consistent with acute presentation with potential threat to life or bodily function.   PLAN: Will obtain cardiac labs, check electrolytes, thyroid function.  Will give IV fluids as he has had some documented low blood  pressures but no fevers.  He denies any infectious symptoms.   MEDICATIONS GIVEN IN ED: Medications  allopurinol  (ZYLOPRIM ) tablet 100 mg (has no administration in time range)  butalbital -acetaminophen -caffeine  (FIORICET ) 50-325-40 MG per tablet 1-2 tablet (has no administration in time range)  colchicine  tablet 0.6 mg (has no administration in time range)  oxyCODONE  (Oxy IR/ROXICODONE ) immediate release tablet 15 mg (has no administration in time range)  lisinopril  (ZESTRIL ) tablet 40 mg (has no administration in time range)  rosuvastatin  (CRESTOR ) tablet 10 mg (has no administration in time range)  hydrOXYzine  (ATARAX ) tablet 50 mg (has no administration in time range)  empagliflozin  (JARDIANCE ) tablet 25 mg (has no administration in time range)  sennosides-docusate sodium  (SENOKOT-S) 8.6-50 MG tablet 1 tablet (has no administration in time range)  magnesium  oxide (MAG-OX) tablet 400 mg (has no administration in time range)  tamsulosin  (FLOMAX ) capsule 0.4 mg (has no administration in time range)  omega-3 acid ethyl esters (LOVAZA ) capsule 2,000 mg (has no administration in  time range)  naloxone  (NARCAN ) nasal spray 4 mg/0.1 mL (has no administration in time range)  cholecalciferol  (VITAMIN D3) 25 MCG (1000 UNIT) tablet 5,000 Units (has no administration in time range)  multivitamin with minerals tablet 1 tablet (has no administration in time range)  0.9 %  sodium chloride  infusion ( Intravenous New Bag/Given 10/26/23 0358)  acetaminophen  (TYLENOL ) tablet 650 mg (has no administration in time range)    Or  acetaminophen  (TYLENOL ) suppository 650 mg (has no administration in time range)  traZODone  (DESYREL ) tablet 25 mg (has no administration in time range)  ondansetron  (ZOFRAN ) tablet 4 mg (has no administration in time range)    Or  ondansetron  (ZOFRAN ) injection 4 mg (has no administration in time range)  pantoprazole  (PROTONIX ) injection 40 mg (40 mg Intravenous Given 10/26/23 0358)   sodium chloride  0.9 % bolus 1,000 mL (0 mLs Intravenous Stopped 10/26/23 0058)     ED COURSE: Patient's labs show pancytopenia.  His hemoglobin went from 14.3 in January down to 8.5 today.  He is on Eliquis.  Rectal exam performed and shows brown stool but is trace guaiac positive.  He also has chronic kidney disease which is stable which could be contributing to his anemia but given this acute change in his hemoglobin, guaiac positive stools while on anticoagulation, I have recommended admission.  Patient has had some tachycardia, tachypnea and with this leukopenia I am concerned for possible infection however he denies any infectious symptoms, is afebrile and his urinalysis and chest x-ray when reviewed interpreted by myself and the radiologist showed no sign of infection today.  I do not feel antibiotics are indicated.  Will continue to monitor.  Also no indication currently for transfusion unless hemoglobin drops less than 7.  First troponin negative.  Second slightly elevated likely due to demand ischemia from SVT.  He continues to be in a sinus rhythm and EKG is nonischemic.  We discussed that I was concerned about possible PE given sudden onset SVT with chest pain and hypotension and with a prior history of DVT.  Did obtain a D-dimer as patient has anaphylactic reaction to IV contrast dye documented.  States he has never been given premedication for CT scan.  D-dimer is elevated but when age-adjusted is negative but given he has high risk, will obtain VQ scan for the morning.  Electrolytes today are normal.  TSH normal.  Recommended admission to the hospital.  Patient initially requesting to go to the [facility] but currently the [facility] is on diversion.  After lengthy discussion with patient and family, patient agrees to stay here.  Will discuss with our hospitalist.   CONSULTS:  Consulted and discussed patient's case with hospitalist, Dr. Lawence.  I have recommended admission and consulting physician  agrees and will place admission orders.  Patient (and family if present) agree with this plan.   I reviewed all nursing notes, vitals, pertinent previous records.  All labs, EKGs, imaging ordered have been independently reviewed and interpreted by myself.    OUTSIDE RECORDS REVIEWED: [facility].       FINAL CLINICAL IMPRESSION(S) / ED DIAGNOSES   Final diagnoses:  Anemia, unspecified type  Lower GI bleed  Chest pain, unspecified type  SVT (supraventricular tachycardia) (HCC)     Rx / DC Orders   ED Discharge Orders     None        Note:  This document was prepared using Dragon voice recognition software and may include unintentional dictation errors.  Harve Spradley, Josette SAILOR, DO 10/26/23 [id number]

## 2023-10-25 NOTE — ED Triage Notes (Signed)
 BIB EMS after the patient called out for chest pressure and shortness of breath. EMS noticed patient was in SVT at a rate of 170 en route. EMS had the pateint perform a vagal maneuver which successfully converted the patient back into sinus tach. Denies any cardiac hx, but is on Eliquis for a blood clot. NAD. Patient denies any chest pressure or shortness of breath in arrival.

## 2023-10-26 ENCOUNTER — Inpatient Hospital Stay

## 2023-10-26 ENCOUNTER — Inpatient Hospital Stay (HOSPITAL_BASED_OUTPATIENT_CLINIC_OR_DEPARTMENT_OTHER)
Admit: 2023-10-26 | Discharge: 2023-10-26 | Disposition: A | Discharge status: Home / Self Care | Attending: Internal Medicine | Admitting: Internal Medicine

## 2023-10-26 DIAGNOSIS — K922 Gastrointestinal hemorrhage, unspecified: Secondary | ICD-10-CM | POA: Diagnosis not present

## 2023-10-26 DIAGNOSIS — R55 Syncope and collapse: Secondary | ICD-10-CM | POA: Diagnosis not present

## 2023-10-26 DIAGNOSIS — K5791 Diverticulosis of intestine, part unspecified, without perforation or abscess with bleeding: Secondary | ICD-10-CM | POA: Diagnosis not present

## 2023-10-26 LAB — BASIC METABOLIC PANEL WITH GFR
Anion gap: 9 (ref 5–15)
BUN: 55 mg/dL — ABNORMAL HIGH (ref 8–23)
CO2: 19 mmol/L — ABNORMAL LOW (ref 22–32)
Calcium: 8.3 mg/dL — ABNORMAL LOW (ref 8.9–10.3)
Chloride: 114 mmol/L — ABNORMAL HIGH (ref 98–111)
Creatinine, Ser: 2.39 mg/dL — ABNORMAL HIGH (ref 0.61–1.24)
GFR, Estimated: 27 mL/min — ABNORMAL LOW (ref >60)
Glucose, Bld: 135 mg/dL — ABNORMAL HIGH (ref 70–99)
Potassium: 4.2 mmol/L (ref 3.5–5.1)
Sodium: 142 mmol/L (ref 135–145)

## 2023-10-26 LAB — IRON AND TIBC
Iron: 89 ug/dL (ref 45–182)
Saturation Ratios: 37 % (ref 17.9–39.5)
TIBC: 242 ug/dL — ABNORMAL LOW (ref 250–450)
UIBC: 153 ug/dL

## 2023-10-26 LAB — URINALYSIS, W/ REFLEX TO CULTURE (INFECTION SUSPECTED)
Bacteria, UA: NONE SEEN
Bilirubin Urine: NEGATIVE
Glucose, UA: 150 mg/dL — AB
Hgb urine dipstick: NEGATIVE
Ketones, ur: NEGATIVE mg/dL
Leukocytes,Ua: NEGATIVE
Nitrite: NEGATIVE
Protein, ur: NEGATIVE mg/dL
Specific Gravity, Urine: 1.018 (ref 1.005–1.030)
Squamous Epithelial / HPF: 0 /HPF (ref 0–5)
pH: 5 (ref 5.0–8.0)

## 2023-10-26 LAB — CBC WITH DIFFERENTIAL/PLATELET
Abs Immature Granulocytes: 0.01 K/uL (ref 0.00–0.07)
Basophils Absolute: 0 K/uL (ref 0.0–0.1)
Basophils Relative: 1 %
Eosinophils Absolute: 0.1 K/uL (ref 0.0–0.5)
Eosinophils Relative: 4 %
HCT: 26.2 % — ABNORMAL LOW (ref 39.0–52.0)
Hemoglobin: 8.5 g/dL — ABNORMAL LOW (ref 13.0–17.0)
Immature Granulocytes: 0 %
Lymphocytes Relative: 16 %
Lymphs Abs: 0.6 K/uL — ABNORMAL LOW (ref 0.7–4.0)
MCH: 33.3 pg (ref 26.0–34.0)
MCHC: 32.4 g/dL (ref 30.0–36.0)
MCV: 102.7 fL — ABNORMAL HIGH (ref 80.0–100.0)
Monocytes Absolute: 0.3 K/uL (ref 0.1–1.0)
Monocytes Relative: 9 %
Neutro Abs: 2.5 K/uL (ref 1.7–7.7)
Neutrophils Relative %: 70 %
Platelets: 132 K/uL — ABNORMAL LOW (ref 150–400)
RBC: 2.55 MIL/uL — ABNORMAL LOW (ref 4.22–5.81)
RDW: 14.9 % (ref 11.5–15.5)
WBC: 3.5 K/uL — ABNORMAL LOW (ref 4.0–10.5)
nRBC: 0 % (ref 0.0–0.2)

## 2023-10-26 LAB — D-DIMER, QUANTITATIVE: D-Dimer, Quant: 0.66 ug{FEU}/mL — ABNORMAL HIGH (ref 0.00–0.50)

## 2023-10-26 LAB — COMPREHENSIVE METABOLIC PANEL WITH GFR
ALT: 19 U/L (ref 0–44)
AST: 17 U/L (ref 15–41)
Albumin: 3.7 g/dL (ref 3.5–5.0)
Alkaline Phosphatase: 83 U/L (ref 38–126)
Anion gap: 7 (ref 5–15)
BUN: 59 mg/dL — ABNORMAL HIGH (ref 8–23)
CO2: 20 mmol/L — ABNORMAL LOW (ref 22–32)
Calcium: 8.6 mg/dL — ABNORMAL LOW (ref 8.9–10.3)
Chloride: 113 mmol/L — ABNORMAL HIGH (ref 98–111)
Creatinine, Ser: 2.64 mg/dL — ABNORMAL HIGH (ref 0.61–1.24)
GFR, Estimated: 24 mL/min — ABNORMAL LOW (ref >60)
Glucose, Bld: 160 mg/dL — ABNORMAL HIGH (ref 70–99)
Potassium: 4.4 mmol/L (ref 3.5–5.1)
Sodium: 140 mmol/L (ref 135–145)
Total Bilirubin: 0.8 mg/dL (ref 0.0–1.2)
Total Protein: 6.4 g/dL — ABNORMAL LOW (ref 6.5–8.1)

## 2023-10-26 LAB — ECHOCARDIOGRAM COMPLETE
AR max vel: 1.74 cm2
AV Peak grad: 8.5 mmHg
Ao pk vel: 1.46 m/s
Area-P 1/2: 3.27 cm2
Height: 72 in
P 1/2 time: 721 ms
S' Lateral: 2.8 cm
Weight: 2720 [oz_av]

## 2023-10-26 LAB — RETICULOCYTES
Immature Retic Fract: 12.5 % (ref 2.3–15.9)
RBC.: 2.6 MIL/uL — ABNORMAL LOW (ref 4.22–5.81)
Retic Count, Absolute: 61.6 K/uL (ref 19.0–186.0)
Retic Ct Pct: 2.4 % (ref 0.4–3.1)

## 2023-10-26 LAB — HEMOGLOBIN AND HEMATOCRIT, BLOOD
HCT: 24.3 % — ABNORMAL LOW (ref 39.0–52.0)
HCT: 24.8 % — ABNORMAL LOW (ref 39.0–52.0)
Hemoglobin: 7.8 g/dL — ABNORMAL LOW (ref 13.0–17.0)
Hemoglobin: 8.1 g/dL — ABNORMAL LOW (ref 13.0–17.0)

## 2023-10-26 LAB — CBC
HCT: 25.3 % — ABNORMAL LOW (ref 39.0–52.0)
Hemoglobin: 8 g/dL — ABNORMAL LOW (ref 13.0–17.0)
MCH: 32.8 pg (ref 26.0–34.0)
MCHC: 31.6 g/dL (ref 30.0–36.0)
MCV: 103.7 fL — ABNORMAL HIGH (ref 80.0–100.0)
Platelets: 131 K/uL — ABNORMAL LOW (ref 150–400)
RBC: 2.44 MIL/uL — ABNORMAL LOW (ref 4.22–5.81)
RDW: 14.6 % (ref 11.5–15.5)
WBC: 4.2 K/uL (ref 4.0–10.5)
nRBC: 0 % (ref 0.0–0.2)

## 2023-10-26 LAB — PROTIME-INR
INR: 1.3 — ABNORMAL HIGH (ref 0.8–1.2)
Prothrombin Time: 16.6 s — ABNORMAL HIGH (ref 11.4–15.2)

## 2023-10-26 LAB — TYPE AND SCREEN
ABO/RH(D): A POS
Antibody Screen: NEGATIVE

## 2023-10-26 LAB — FERRITIN: Ferritin: 236 ng/mL (ref 24–336)

## 2023-10-26 LAB — MAGNESIUM: Magnesium: 1.8 mg/dL (ref 1.7–2.4)

## 2023-10-26 LAB — TROPONIN I (HIGH SENSITIVITY)
Troponin I (High Sensitivity): 4 ng/L (ref <18)
Troponin I (High Sensitivity): 28 ng/L — ABNORMAL HIGH (ref <18)
Troponin I (High Sensitivity): 30 ng/L — ABNORMAL HIGH (ref <18)

## 2023-10-26 LAB — VITAMIN B12: Vitamin B-12: 1769 pg/mL — ABNORMAL HIGH (ref 180–914)

## 2023-10-26 LAB — TSH: TSH: 1.053 u[IU]/mL (ref 0.350–4.500)

## 2023-10-26 MED ORDER — POLYETHYLENE GLYCOL 3350 17 G PO PACK
17.0000 g | PACK | Freq: Two times a day (BID) | ORAL | Status: DC
  Administered 2023-10-26 (×2): 17 g via ORAL
  Filled 2023-10-26 (×2): qty 1

## 2023-10-26 MED ORDER — FLUTICASONE PROPIONATE 50 MCG/ACT NA SUSP: 1.0000 | Freq: Every day | NASAL | Status: DC | PRN

## 2023-10-26 MED ORDER — ALLOPURINOL 100 MG PO TABS
100.0000 mg | ORAL_TABLET | Freq: Every day | ORAL | Status: DC
  Administered 2023-10-27: 100 mg via ORAL
  Filled 2023-10-26: qty 1

## 2023-10-26 MED ORDER — OXYCODONE HCL 5 MG PO TABS
15.0000 mg | ORAL_TABLET | Freq: Four times a day (QID) | ORAL | Status: DC
  Administered 2023-10-26 (×2): 15 mg via ORAL

## 2023-10-26 MED ORDER — MAGNESIUM OXIDE 400 MG PO TABS
400.0000 mg | ORAL_TABLET | Freq: Every day | ORAL | Status: DC
Start: 2023-10-26 — End: 2023-10-27
  Administered 2023-10-26 – 2023-10-27 (×2): 400 mg via ORAL
  Filled 2023-10-26 (×3): qty 1

## 2023-10-26 MED ORDER — EMPAGLIFLOZIN 10 MG PO TABS
10.0000 mg | ORAL_TABLET | Freq: Every day | ORAL | Status: DC
  Administered 2023-10-26 – 2023-10-27 (×2): 10 mg via ORAL
  Filled 2023-10-26 (×2): qty 1

## 2023-10-26 MED ORDER — ROSUVASTATIN CALCIUM 10 MG PO TABS
10.0000 mg | ORAL_TABLET | Freq: Every day | ORAL | Status: DC
  Administered 2023-10-26: 10 mg via ORAL
  Filled 2023-10-26 (×2): qty 1

## 2023-10-26 MED ORDER — L-TRYPTOPHAN 500 MG PO CAPS: 500.0000 mg | ORAL_CAPSULE | Freq: Every day | ORAL | Status: DC

## 2023-10-26 MED ORDER — ONDANSETRON HCL 4 MG/2ML IJ SOLN: 4.0000 mg | Freq: Four times a day (QID) | INTRAMUSCULAR | Status: DC | PRN

## 2023-10-26 MED ORDER — EPOETIN ALFA-EPBX 10000 UNIT/ML IJ SOLN
4000.0000 [IU] | Freq: Once | INTRAMUSCULAR | Status: AC
  Administered 2023-10-26: 4000 [IU] via SUBCUTANEOUS
  Filled 2023-10-26: qty 2

## 2023-10-26 MED ORDER — LISINOPRIL 10 MG PO TABS: 20.0000 mg | ORAL_TABLET | Freq: Every day | ORAL | Status: DC

## 2023-10-26 MED ORDER — TECHNETIUM TO 99M ALBUMIN AGGREGATED
4.0000 | Freq: Once | INTRAVENOUS | Status: AC | PRN
  Administered 2023-10-26: 4.4 via INTRAVENOUS

## 2023-10-26 MED ORDER — ACETAMINOPHEN 325 MG PO TABS: 650.0000 mg | ORAL_TABLET | Freq: Four times a day (QID) | ORAL | Status: DC | PRN

## 2023-10-26 MED ORDER — SENNOSIDES-DOCUSATE SODIUM 8.6-50 MG PO TABS
2.0000 | ORAL_TABLET | Freq: Two times a day (BID) | ORAL | Status: DC
  Administered 2023-10-26: 2 via ORAL
  Filled 2023-10-26: qty 2

## 2023-10-26 MED ORDER — ADULT MULTIVITAMIN W/MINERALS CH
1.0000 | ORAL_TABLET | Freq: Every day | ORAL | Status: DC
  Administered 2023-10-26 – 2023-10-27 (×2): 1 via ORAL
  Filled 2023-10-26 (×2): qty 1

## 2023-10-26 MED ORDER — LACTULOSE 10 GM/15ML PO SOLN: 30.0000 g | Freq: Two times a day (BID) | ORAL | Status: DC | PRN

## 2023-10-26 MED ORDER — OMEGA-3-ACID ETHYL ESTERS 1 G PO CAPS
2000.0000 mg | ORAL_CAPSULE | Freq: Two times a day (BID) | ORAL | Status: DC
  Administered 2023-10-26 – 2023-10-27 (×3): 2000 mg via ORAL
  Filled 2023-10-26 (×4): qty 2

## 2023-10-26 MED ORDER — HYDROXYZINE HCL 25 MG PO TABS
50.0000 mg | ORAL_TABLET | Freq: Four times a day (QID) | ORAL | Status: DC | PRN
  Administered 2023-10-26: 50 mg via ORAL

## 2023-10-26 MED ORDER — SODIUM CHLORIDE 0.9 % IV SOLN: INTRAVENOUS | Status: AC

## 2023-10-26 MED ORDER — VITAMIN D 25 MCG (1000 UNIT) PO TABS
5000.0000 [IU] | ORAL_TABLET | Freq: Every day | ORAL | Status: DC
  Administered 2023-10-26: 5000 [IU] via ORAL
  Filled 2023-10-26 (×2): qty 5

## 2023-10-26 MED ORDER — BUTALBITAL-APAP-CAFFEINE 50-325-40 MG PO TABS: 1.0000 | ORAL_TABLET | ORAL | Status: DC | PRN

## 2023-10-26 MED ORDER — TRAZODONE HCL 50 MG PO TABS
25.0000 mg | ORAL_TABLET | Freq: Every evening | ORAL | Status: DC | PRN
  Filled 2023-10-26: qty 1

## 2023-10-26 MED ORDER — LATANOPROST 0.005 % OP SOLN
1.0000 [drp] | Freq: Every day | OPHTHALMIC | Status: DC
  Filled 2023-10-26: qty 2.5

## 2023-10-26 MED ORDER — OXYCODONE HCL 5 MG PO TABS
15.0000 mg | ORAL_TABLET | Freq: Every day | ORAL | Status: DC
  Administered 2023-10-27 (×2): 15 mg via ORAL
  Filled 2023-10-26: qty 3

## 2023-10-26 MED ORDER — PANTOPRAZOLE SODIUM 40 MG IV SOLR
40.0000 mg | Freq: Two times a day (BID) | INTRAVENOUS | Status: DC
  Administered 2023-10-26 – 2023-10-27 (×3): 40 mg via INTRAVENOUS
  Filled 2023-10-26 (×3): qty 10

## 2023-10-26 MED ORDER — PANTOPRAZOLE SODIUM 40 MG PO TBEC: 40.0000 mg | DELAYED_RELEASE_TABLET | Freq: Two times a day (BID) | ORAL | Status: DC

## 2023-10-26 MED ORDER — TAMSULOSIN HCL 0.4 MG PO CAPS
0.4000 mg | ORAL_CAPSULE | Freq: Every day | ORAL | Status: DC
  Administered 2023-10-26: 0.4 mg via ORAL
  Filled 2023-10-26: qty 1

## 2023-10-26 MED ORDER — TIMOLOL MALEATE 0.5 % OP SOLN
1.0000 [drp] | Freq: Two times a day (BID) | OPHTHALMIC | Status: DC
  Filled 2023-10-26: qty 5

## 2023-10-26 MED ORDER — ONDANSETRON HCL 4 MG PO TABS: 4.0000 mg | ORAL_TABLET | Freq: Four times a day (QID) | ORAL | Status: DC | PRN

## 2023-10-26 MED ORDER — SAW PALMETTO 450 MG PO CAPS: 900.0000 mg | ORAL_CAPSULE | Freq: Every day | ORAL | Status: DC

## 2023-10-26 MED ORDER — ACETAMINOPHEN 650 MG RE SUPP: 650.0000 mg | Freq: Four times a day (QID) | RECTAL | Status: DC | PRN

## 2023-10-26 MED ORDER — COLCHICINE 0.6 MG PO TABS: 0.6000 mg | ORAL_TABLET | Freq: Every day | ORAL | Status: DC

## 2023-10-26 MED ORDER — NALOXONE HCL 4 MG/0.1ML NA LIQD: 1.0000 | Freq: Once | NASAL | Status: DC | PRN

## 2023-10-26 NOTE — ED Notes (Addendum)
 This RN to bedside with Pt to take his home medications for pain (See MAR). Pt has call bell in reach. Pt  has not further needs at this time.

## 2023-10-26 NOTE — Progress Notes (Signed)
 Attending Dr. Note for this morning states that patient should hold off on eliquis today and possibly tomorrow. Patient told RN tonight he did in fact take his eliquis today from his home prescription. Patient promised RN to not take anymore of his home prescriptions while here. Will continue to monitor.

## 2023-10-26 NOTE — Progress Notes (Addendum)
 RN walked into patients room at 2115 to give patient night meds. Patient refused to take the oxycodone  from this hospital and insisted on taking his instead, stating that  he will get in trouble from pain clinic like when he had carpel tunnel surgery. RN tried to ensure patient that he wont get in trouble from following  attending doctors orders here for a GI Bleed diagnosis and to ensure you get proper documentation of this hospital visit before you are discharged. Patient refused and instead wants to take his medication instead.  RN personally witnessed patient take his 15mg  oxycodone  dose with patients significant other at bedside and promised RN to not take any other medications. Will continue to monitor.SABRASABRA

## 2023-10-26 NOTE — Progress Notes (Signed)
 Echocardiogram 2D Echocardiogram has been performed.  Xavier Hebert 10/26/2023, 2:50 PM

## 2023-10-26 NOTE — ED Notes (Signed)
Pt gone to [facility]

## 2023-10-26 NOTE — ED Notes (Signed)
 Lab contacted regarding blood work that was sent down after 2230. Verified that they do have the blood work and will run labs now.

## 2023-10-26 NOTE — ED Notes (Signed)
 0500 labs sent at this time

## 2023-10-26 NOTE — ED Notes (Signed)
 Pt stating that he goes to a pain clinic and can only take the medication that is prescribed/given to him from the clinic. Pt states if he is given any pain medication at the hospital and instead of what is provided to him from the pain clinic then he will kicked out from the program. Discussed pt concerns with Madison Peaches, MD. MD verified that it's okay for pt to take home pain medications while staying in hospital.

## 2023-10-26 NOTE — H&P (Addendum)
 History and Physical    Xavier Hebert:[id number] DOB: Oct 12, 1945 DOA: 10/25/2023  PCP: Orlean Alan HERO, FNP (Confirm with patient/family/NH records and if not entered, this has to be entered at [facility] point of entry) Patient coming from: Home  I have personally briefly reviewed patient's old medical records in [facility]  Chief Complaint: Chest tightness, palpitations  HPI: Xavier Hebert is a 78 y.o. 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  Symptoms started yesterday evening, patient was sitting on the chair resting, then started to feel palpitations and lightheadedness blurry vision and a sensation to pass out, soon he started to feel tightness in the chest, nonradiating associated with shortness of breath, family called EMS, EMS arrived and found patient heart rate 170s and SVT.  En route, after vagal maneuver, heart rate broke back to normal sinus rhythm and patient symptoms improved.  Returned couple weeks ago patient has been feeling increasing exertional dyspnea but denied any cough or chest pain.  Patient was diagnosed with DVT last year and has been taking Eliquis.  In addition, patient has been taking high-dose of narcotics of oxycodone  50 mg 4-5 times a day to treat his severe back pain and neck pain by pain management, this regimen appears to causing severe constipation despite taking daily stool softener and laxative and 1-2 times a week patient had to use my new enema alternated with water enema and oral enema and banding times minute disimpact.  Even so, he continued to have severe constipation and suffered from intermittent lower GI bleed of bright red blood coating stool and likely hemorrhoid as well as fissure.  He said most occasions the bleeding will stop by its own.  Denied any abdominal pain no hematemesis or  black tarry stool.    ED Course: Afebrile, heart rate 70 blood pressure 104/62 O2 saturation 100% on room air.  WBC 3.5 hemoglobin 8.5 compared to baseline around 14 of several months ago, BUN 59 creatinine 2.6 compared to baseline 2.3 glucose 160.  Iron saturation 37% iron level 89, reticulocyte count 2.4%.  Troponin 4> 28  Review of Systems: As per HPI otherwise 14 point review of systems negative.    Past Medical History:  Diagnosis Date   Age-related nuclear cataract of both eyes 01/21/2022   Anxiety    Arthritis    Chronic kidney disease, stage 3a (HCC) 08/16/2022   Aug 16, 2022 Entered By: RONNETTE MAXWELL A Comment: b/l Cr ~2-2.2   Chronic pain syndrome    CKD (chronic kidney disease) stage 3, GFR 30-59 ml/min (HCC)    Depression    Diabetes mellitus without complication (HCC)    Type II   DVT (deep venous thrombosis) (HCC)    Left lower leg   Episodic tension-type headache, not intractable 08/06/2022   Exposure to potentially hazardous substance 08/16/2022   Jul 05, 2022 Entered By: ROSANA DRAGON Comment: Entered automatically through [facility] Problem List documentation program   GERD (gastroesophageal reflux disease)    Headache    Migraine- headache   Hiatal hernia    History of urinary stone 08/06/2022   Hyperlipidemia    Hypertension    Kidney stones    Neck stiffness    limited up and down motion   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, unspecified laterality 11/27/2019   Peptic ulcer    Sleep apnea  does not use cpap/ pt states he lost 60#   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) 07/17/2019   Status post laser cataract surgery of both eyes 05/10/2022   Unspecified glaucoma 08/06/2022   Wears hearing aid in both ears    Has, does not wear    Past Surgical History:  Procedure Laterality Date   ANTERIOR CERVICAL DECOMP/DISCECTOMY FUSION  2009   CARPAL TUNNEL RELEASE Left 01/03/2018   Procedure: CARPAL TUNNEL  RELEASE;  Surgeon: Kathlynn Sharper, MD;  Location: [facility] ORS;  Service: Orthopedics;  Laterality: Left;   CARPAL TUNNEL RELEASE Right 07/17/2023   Procedure: CARPAL TUNNEL RELEASE;  Surgeon: Kathlynn Sharper, MD;  Location: [facility];  Service: Orthopedics;  Laterality: Right;   CARPOMETACARPAL (CMC) FUSION OF THUMB Left 08/10/2020   Procedure: CARPOMETACARPAL [facility]) FUSION OF THUMB;  Surgeon: Kathlynn Sharper, MD;  Location: [facility] ORS;  Service: Orthopedics;  Laterality: Left;   COLONOSCOPY WITH PROPOFOL  N/A 11/21/2017   Procedure: COLONOSCOPY WITH PROPOFOL ;  Surgeon: Toledo, Ladell POUR, MD;  Location: [facility] ENDOSCOPY;  Service: Gastroenterology;  Laterality: N/A;   CYSTOSCOPY     x3- 2 times for stone removal    ESOPHAGOGASTRODUODENOSCOPY N/A 10/14/2017   Procedure: ESOPHAGOGASTRODUODENOSCOPY (EGD);  Surgeon: Unk Corinn Skiff, MD;  Location[facility] ENDOSCOPY;  Service: Gastroenterology;  Laterality: N/A;   ESOPHAGOGASTRODUODENOSCOPY (EGD) WITH PROPOFOL  N/A 10/25/2017   Procedure: ESOPHAGOGASTRODUODENOSCOPY (EGD) WITH PROPOFOL ;  Surgeon: Unk Corinn Skiff, MD;  Location: [facility] ENDOSCOPY;  Service: Gastroenterology;  Laterality: N/A;   JOINT REPLACEMENT     bil. knees , shoulder rt, and neck    REVERSE SHOULDER ARTHROPLASTY Right 12/01/2014   REVERSE SHOULDER ARTHROPLASTY Right 12/01/2014   Procedure: REVERSE SHOULDER ARTHROPLASTY;  Surgeon: Glendia Cordella Hutchinson, MD;  Location: [facility] OR;  Service: Orthopedics;  Laterality: Right;   SHOULDER ARTHROSCOPY W/ ROTATOR CUFF REPAIR Right 06/10/2014   failed   SHOULDER OPEN ROTATOR CUFF REPAIR Left ~ 2008   TOTAL KNEE ARTHROPLASTY Bilateral 2005-2008   right-left   TRIGGER FINGER RELEASE Right 2001   TRIGGER FINGER RELEASE Left 08/10/2020   Procedure: RELEASE TRIGGER FINGER/A-1 PULLEY;  Surgeon: Kathlynn Sharper, MD;  Location: [facility] ORS;  Service: Orthopedics;  Laterality: Left;   ULNAR TUNNEL RELEASE Right 07/17/2023   Procedure: RELEASE, CUBITAL TUNNEL;   Surgeon: Kathlynn Sharper, MD;  Location: [facility];  Service: Orthopedics;  Laterality: Right;   URETERAL STENT PLACEMENT       reports that he has never smoked. He has never used smokeless tobacco. He reports that he does not drink alcohol and does not use drugs.  Allergies  Allergen Reactions   Ivp Dye [Iodinated Contrast Media] Anaphylaxis and Other (See Comments)    Breathing difficulty and chest pain   Quinine Shortness Of Breath, Rash and Other (See Comments)    Other reaction(s): SHORTNESS OF BREATH   Amlodipine  Swelling    Other reaction(s): Edema of lower extremity   Aspirin Other (See Comments)    Stomach pain   Gabapentin     Other reaction(s): Dizziness   Glipizide Other (See Comments)    Other reaction(s): Chest pain   Hydrochlorothiazide Other (See Comments)    Other reaction(s): Impotence   Ibuprofen Other (See Comments)    Stomach pain  Other Reaction(s): ITCHING,WATERING EYES   Ioxaglate Other (See Comments)    Breathing difficulty and chest pain   Latex Itching    Gloves   Metformin And Related Diarrhea   Nsaids  Kidneys   Omeprazole  Other (See Comments)    Other reaction(s): FAILED THERAPY   Pregabalin Other (See Comments)    Other reaction(s): Dizziness   Doxycycline  Anxiety   Pioglitazone Diarrhea and Other (See Comments)    Other reaction(s): Diarrhea, Abdominal pain   Tape Rash    Some bandages and wraps have started causing rashes.  Unsure if it is the adhesive or the bandage material    Family History  Problem Relation Age of Onset   Dementia Mother    Alcohol abuse Father     Prior to Admission medications   Medication Sig Start Date End Date Taking? Authorizing Provider  acetaminophen  (TYLENOL ) 325 MG tablet Take 650 mg by mouth 4 (four) times daily.   Yes [provider]  allopurinol  (ZYLOPRIM ) 100 MG tablet Take 100 mg by mouth daily.  07/11/17  Yes [provider]  apixaban (ELIQUIS) 5 MG TABS tablet Take  5 mg by mouth 2 (two) times daily.   Yes [provider]  butalbital -acetaminophen -caffeine  (FIORICET , ESGIC ) 50-325-40 MG per tablet Take 1-2 tablets by mouth See admin instructions. Take 2 tablets by mouth at start of headache and take 1 additional tablet by mouth after 2 hours if needed - max 3 tablets daily   Yes [provider]  cholecalciferol  (VITAMIN D ) 1000 units tablet Take 5,000 Units by mouth daily.   Yes [provider]  clobetasol cream (TEMOVATE) 0.05 % Apply 1 application topically daily as needed (eczema). Apply to affected area(s) 2 to 3 times weekly as needed   Yes [provider]  colchicine  0.6 MG tablet TAKE 1 TABLET BY MOUTH DAILY Patient taking differently: Take 0.6 mg by mouth daily as needed. 11/16/21  Yes Janit Thresa HERO, DPM  empagliflozin  (JARDIANCE ) 25 MG TABS tablet Take 12.5 mg by mouth daily.   Yes [provider]  flunisolide (NASALIDE) 25 MCG/ACT (0.025%) SOLN PLACE 1 SPRAY INTO EACH NOSTRIL TWICE DAILY 02/10/23  Yes Shirley, Amanda M, FNP  hydrOXYzine  (ATARAX ) 50 MG tablet TAKE 1 TABLET BY MOUTH EVERY 4 TO 6 HOURS AS NEEDED 10/03/23  Yes Orlean Alan HERO, FNP  latanoprost  (XALATAN ) 0.005 % ophthalmic solution Place 1 drop into both eyes at bedtime.   Yes [provider]  lidocaine  (LIDODERM ) 5 % Place 1 patch onto the skin every 12 (twelve) hours. Remove & Discard patch within 12 hours or as directed by MD 05/19/22  Yes Orlean Alan HERO, FNP  lisinopril  (ZESTRIL ) 20 MG tablet Take 20 mg by mouth daily.   Yes [provider]  magnesium  oxide (MAG-OX) 400 MG tablet Take 400 mg by mouth daily.   Yes [provider]  methocarbamol  (ROBAXIN ) 750 MG tablet TAKE 1 TABLET BY MOUTH 4 TIMES DAILY 08/27/23  Yes Orlean Alan HERO, FNP  Misc Natural Products (OSTEO BI-FLEX ADV TRIPLE ST) TABS Take 1 tablet by mouth in the morning and at bedtime.   Yes [provider]  Multiple Vitamin (MULTIVITAMIN)  tablet Take 1 tablet by mouth daily.   Yes [provider]  oxyCODONE  (ROXICODONE ) 15 MG immediate release tablet Take 15 mg by mouth 5 (five) times daily. Takes 5 times daily (per MD) (1 tablet at 0615, 1 tablet at 1130, 1 tablet at 1730, 1 tablet at 2100 and 1 tablet @ 2300)   Yes [provider]  pantoprazole  (PROTONIX ) 40 MG tablet Take 40 mg by mouth 2 (two) times daily.    Yes [provider]  PAPAV-PHENTOLAMINE-ALPROSTADIL  IC 7 mLs by Intracavernosal route as needed. Super Trimix -injection   Yes [provider]  rosuvastatin  (CRESTOR ) 20 MG tablet Take 10 mg by mouth at bedtime.    Yes [provider]  Saw Palmetto  450 MG CAPS Take 900 mg by mouth in the morning and at bedtime.   Yes [provider]  Semaglutide, 1 MG/DOSE, 4 MG/3ML SOPN Inject 1 mg into the skin once a week. 05/29/22  Yes [provider]  sennosides-docusate sodium  (SENOKOT-S) 8.6-50 MG tablet Take 2 tablets by mouth 2 (two) times daily.   Yes [provider]  tamsulosin  (FLOMAX ) 0.4 MG CAPS capsule Take 0.4 mg by mouth daily.   Yes [provider]  timolol  (TIMOPTIC ) 0.5 % ophthalmic solution Place 1 drop into the left eye 2 (two) times daily. 10/24/23  Yes [provider]  cephALEXin  (KEFLEX ) 500 MG capsule Take 1 capsule (500 mg total) by mouth 3 (three) times daily. Patient not taking: Reported on 10/26/2023 08/17/23   Janit Thresa HERO, DPM  Coconut Oil 1000 MG CAPS Take 2,000 mg by mouth 2 (two) times daily. Patient not taking: Reported on 10/26/2023    [provider]  docusate sodium  (COLACE) 100 MG capsule Take 100 mg by mouth 4 (four) times daily. Patient not taking: Reported on 10/26/2023    [provider]  doxycycline  (VIBRA -TABS) 100 MG tablet Take 1 tablet (100 mg total) by mouth 2 (two) times daily. Patient not taking: Reported on 10/26/2023 02/06/23   Janit Thresa HERO, DPM  ketoconazole (NIZORAL) 2 % shampoo Apply  1 application topically once a week.  Patient not taking: Reported on 10/26/2023 11/10/14   [provider]  L-Tryptophan  500 MG CAPS Take 500 mg by mouth at bedtime. Patient not taking: Reported on 10/26/2023    [provider]  naloxone  (NARCAN ) nasal spray 4 mg/0.1 mL Place 1 spray into the nose.    [provider]  oxyCODONE  (ROXICODONE ) 5 MG immediate release tablet Take 1-2 tablets (5-10 mg total) by mouth every 6 (six) hours as needed for moderate pain (pain score 4-6). 07/17/23 07/16/24  Kathlynn Sharper, MD  predniSONE  (DELTASONE ) 10 MG tablet Take 2 tablets (20 mg total) by mouth daily with breakfast. Patient not taking: Reported on 10/26/2023 08/16/22   Orlean Alan HERO, FNP  sildenafil (VIAGRA) 100 MG tablet Take 100 mg by mouth daily as needed (ED). Patient not taking: Reported on 10/26/2023    [provider]  silver  sulfADIAZINE  (SILVADENE ) 1 % cream Apply 1 application topically daily. Patient not taking: Reported on 10/26/2023 07/15/19   Janit Thresa HERO, DPM  Testosterone  20 % CREA Apply 1 Application topically 2 (two) times daily. Patient not taking: Reported on 10/26/2023 04/06/23   Orlean Alan HERO, FNP    Physical Exam: Vitals:   10/26/23 0500 10/26/23 0530 10/26/23 0700 10/26/23 0730  BP: 100/69 104/62 (!) 102/59 116/69  Pulse: 66 73 66 90  Resp: 12 15 15 11   Temp:      TempSrc:      SpO2: 100% 100% 100% 99%  Weight:      Height:        Constitutional: NAD, calm, comfortable Vitals:   10/26/23 0500 10/26/23 0530 10/26/23 0700 10/26/23 0730  BP: 100/69 104/62 (!) 102/59 116/69  Pulse: 66 73 66 90  Resp: 12 15 15 11   Temp:      TempSrc:      SpO2: 100% 100% 100% 99%  Weight:  Height:       Eyes: PERRL, lids and conjunctivae normal ENMT: Mucous membranes are moist. Posterior pharynx clear of any exudate or lesions.Normal dentition.  Neck: normal, supple, no masses, no thyromegaly Respiratory: clear to auscultation bilaterally, no  wheezing, no crackles. Normal respiratory effort. No accessory muscle use.  Cardiovascular: Regular rate and rhythm, no murmurs / rubs / gallops. No extremity edema. 2+ pedal pulses. No carotid bruits.  Abdomen: no tenderness, no masses palpated. No hepatosplenomegaly. Bowel sounds positive.  Musculoskeletal: no clubbing / cyanosis. No joint deformity upper and lower extremities. Good ROM, no contractures. Normal muscle tone.  Skin: no rashes, lesions, ulcers. No induration Neurologic: CN 2-12 grossly intact. Sensation intact, DTR normal. Strength 5/5 in all 4.  Psychiatric: Normal judgment and insight. Alert and oriented x 3. Normal mood.    Labs on Admission: I have personally reviewed following labs and imaging studies  CBC: Recent Labs  Lab 10/25/23 2237 10/26/23 0738  WBC 3.5* 4.2  NEUTROABS 2.5  --   HGB 8.5* 8.0*  HCT 26.2* 25.3*  MCV 102.7* 103.7*  PLT 132* 131*   Basic Metabolic Panel: Recent Labs  Lab 10/25/23 2237 10/26/23 0738  NA 140 142  K 4.4 4.2  CL 113* 114*  CO2 20* 19*  GLUCOSE 160* 135*  BUN 59* 55*  CREATININE 2.64* 2.39*  CALCIUM  8.6* 8.3*  MG 1.8  --    GFR: Estimated Creatinine Clearance: 28.7 mL/min (A) (by C-G formula based on SCr of 2.39 mg/dL (H)). Liver Function Tests: Recent Labs  Lab 10/25/23 2237  AST 17  ALT 19  ALKPHOS 83  BILITOT 0.8  PROT 6.4*  ALBUMIN  3.7   No results for input(s): LIPASE, AMYLASE in the last 168 hours. No results for input(s): AMMONIA in the last 168 hours. Coagulation Profile: Recent Labs  Lab 10/26/23 0256  INR 1.3*   Cardiac Enzymes: No results for input(s): CKTOTAL, CKMB, CKMBINDEX, TROPONINI in the last 168 hours. BNP (last 3 results) No results for input(s): PROBNP in the last 8760 hours. HbA1C: No results for input(s): HGBA1C in the last 72 hours. CBG: No results for input(s): GLUCAP in the last 168 hours. Lipid Profile: No results for input(s): CHOL, HDL,  LDLCALC, TRIG, CHOLHDL, LDLDIRECT in the last 72 hours. Thyroid Function Tests: Recent Labs    10/25/23 2237  TSH 1.053   Anemia Panel: Recent Labs    10/26/23 0256  FERRITIN 236  TIBC 242*  IRON 89  RETICCTPCT 2.4   Urine analysis:    Component Value Date/Time   COLORURINE YELLOW (A) 10/26/2023 0014   APPEARANCEUR CLEAR (A) 10/26/2023 0014   LABSPEC 1.018 10/26/2023 0014   PHURINE 5.0 10/26/2023 0014   GLUCOSEU 150 (A) 10/26/2023 0014   HGBUR NEGATIVE 10/26/2023 0014   BILIRUBINUR NEGATIVE 10/26/2023 0014   KETONESUR NEGATIVE 10/26/2023 0014   PROTEINUR NEGATIVE 10/26/2023 0014   UROBILINOGEN 0.2 11/24/2014 1537   NITRITE NEGATIVE 10/26/2023 0014   LEUKOCYTESUR NEGATIVE 10/26/2023 0014    Radiological Exams on Admission: NM Pulmonary Perfusion Result Date: 10/26/2023 CLINICAL DATA:  Elevated D-dimer. New on set superior ventricular tachycardia. Chest tightness. History of DVT. EXAM: NUCLEAR MEDICINE PERFUSION LUNG SCAN TECHNIQUE: Perfusion images were obtained in multiple projections after intravenous injection of radiopharmaceutical. Ventilation scans intentionally deferred if perfusion scan and chest x-ray adequate for interpretation during COVID 19 epidemic. RADIOPHARMACEUTICALS:  4.4 mCi Tc-21m MAA IV COMPARISON:  Chest x-ray 10/25/2023 FINDINGS: No peripheral segmental perfusion defect evident in either  lung. IMPRESSION: Negative for acute pulmonary embolus. Electronically Signed   By: Camellia Candle M.D.   On: 10/26/2023 08:42   DG Chest Portable 1 View Result Date: 10/25/2023 CLINICAL DATA:  Chest pressure and shortness of breath EXAM: PORTABLE CHEST 1 VIEW COMPARISON:  10/13/2017 FINDINGS: Stable cardiomediastinal silhouette. Aortic atherosclerotic calcification. No focal consolidation, pleural effusion, or pneumothorax. No displaced rib fractures. Right reverse TSA. IMPRESSION: No active disease. Electronically Signed   By: Norman Gatlin M.D.   On: 10/25/2023  23:25    EKG: Independently reviewed.  Sinus rhythm, no acute ST-T changes.  Assessment/Plan Principal Problem:   GI bleeding Active Problems:   Near syncope  (please populate well all problems here in Problem List. (For example, if patient is on BP meds at home and you resume or decide to hold them, it is a problem that needs to be her. Same for CAD, COPD, HLD and so on)  Near syncope - Likely secondary to SVT, paroxysmal.  Clinically suspect the episode of paroxysmal SVT is secondary to decompensated anemia likely from combined effect of chronic lower GI bleed as well as anemia associated with CKD. - Check orthostatic vital signs - Hold off home BP meds - Check echocardiogram  Chest pain Elevated troponins -Clinically suspect chest pain is secondary to demanding ischemia from decompensated anemia and borderline hypotension - Trend troponins - Echocardiogram - Correct anemia with Epogen  and recommend patient follow-up with nephrology at [facility] for periodic Epogen  infusion - Plan to continue Eliquis as long as H&H maintains - No indication for ischemic workup at this point, if patient continued to experience chest pain in the future after anemia corrected, then he will need outpatient stress test.  Acute on chronic macrocytic anemia secondary to CKD and chronic blood loss Chronic blood loss anemia from lower GI bleed - Normal iron study, likely from patient has been taking multivitamin supplement - Review of patient history showed patient has diverticulosis, and clinically suspect chronic intermittent lower GI bleed associated with severe constipation related diverticulosis bleeding, exacerbated by chronic high-dose narcotics. - Recommend patient discuss the constipation issue with pain management physician for Relistor therapy, patient agreed with the plan - Hold off Eliquis for today, and check H&H this afternoon and tonight, if H&H is stable, likely patient can resume Eliquis  tomorrow  HTN - Hold off on BP meds, check orthostatic vital signs  CKD stage IV - Volume neutral, hold off IV fluid - Creatinine level stable - Outpatient follow-up  Hx of DVT - Hold off Eliquis as above  Elevated D-dimer -VQ scan was done and result pending  Chronic narcotic dependence -Continue home regimen of oxycodone   Total time spent on patient care 75 minutes.  DVT prophylaxis: SCD Code Status: Full code Family Communication: Wife at bedside Disposition Plan: Depends on clinical progress, likely patient will need more than 2 midnight hospital stay Consults called: None Admission status: PCU admit   Cort ONEIDA Mana MD [facility] Pager [phone]  10/26/2023, 9:11 AM

## 2023-10-27 DIAGNOSIS — R55 Syncope and collapse: Secondary | ICD-10-CM | POA: Diagnosis not present

## 2023-10-27 DIAGNOSIS — D61818 Other pancytopenia: Secondary | ICD-10-CM | POA: Diagnosis present

## 2023-10-27 DIAGNOSIS — D649 Anemia, unspecified: Secondary | ICD-10-CM

## 2023-10-27 DIAGNOSIS — K922 Gastrointestinal hemorrhage, unspecified: Secondary | ICD-10-CM | POA: Diagnosis not present

## 2023-10-27 LAB — CBC
HCT: 23.6 % — ABNORMAL LOW (ref 39.0–52.0)
Hemoglobin: 7.7 g/dL — ABNORMAL LOW (ref 13.0–17.0)
MCH: 33.2 pg (ref 26.0–34.0)
MCHC: 32.6 g/dL (ref 30.0–36.0)
MCV: 101.7 fL — ABNORMAL HIGH (ref 80.0–100.0)
Platelets: 117 K/uL — ABNORMAL LOW (ref 150–400)
RBC: 2.32 MIL/uL — ABNORMAL LOW (ref 4.22–5.81)
RDW: 14.7 % (ref 11.5–15.5)
WBC: 3 K/uL — ABNORMAL LOW (ref 4.0–10.5)
nRBC: 0 % (ref 0.0–0.2)

## 2023-10-27 MED ORDER — SODIUM CHLORIDE 0.9 % IV SOLN: INTRAVENOUS | Status: DC

## 2023-10-27 MED ORDER — ACETAMINOPHEN 325 MG PO TABS: 650.0000 mg | ORAL_TABLET | Freq: Four times a day (QID) | ORAL | Status: DC | PRN

## 2023-10-27 NOTE — Progress Notes (Signed)
 Patients HR was as low as 36 while sleeping during this shift.

## 2023-10-27 NOTE — Progress Notes (Signed)
 D/C instructions completed and reviewed with patient. Patient verbalized understanding. D/C to home with wife in stable condition. VSS. No distress.

## 2023-10-27 NOTE — Discharge Summary (Signed)
 Physician Discharge Summary   Patient: Xavier Hebert MRN: [id number] DOB: 07-Jan-1946  Admit date:     10/25/2023  Discharge date: 10/27/23  Discharge Physician: Laree Lock   PCP: Orlean Alan HERO, FNP   Recommendations at discharge:   Follow up with PCP at [facility] - within 1 week - Repeat BMP, CBC Monitor BP - lisinopril  held If patient continues to have pancytopenia - will need Hematology referral  Cardiology referral provided Gastroenterology referral provided  Discharge Diagnoses: Principal Problem:   GI bleeding Active Problems:   Near syncope   Anemia  Resolved Problems:   * No resolved hospital problems. *  Hospital Course: Xavier Hebert is a 78 y.o. 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 Denies syncope or near syncope.  As per admitting provider, patient presented with near syncope and was found to be in SVT by EMS.  With vagal maneuver, patient brought to sinus rhythm.  Patient denies any symptoms today.  Workup revealed low hemoglobin 8.5, denies bright red bleeding per rectum or tarry stools.  Patient was found to be bradycardic on monitor intermittently in the hospital.  GI consulted for further evaluation, patient continued to refuse and insisted he wanted to go home and will do any work up only outpatient. Recommended be seen by cardiology due to SVT and intermittent bradycardia and near syncope symptoms.  Patient wants to follow-up outpatient. Recommend to return to ER is symptoms recurr. Hospital course as below    Near syncope - Likely secondary to SVT, paroxysmal.  Clinically suspect the episode of paroxysmal SVT is secondary to decompensated anemia likely from combined effect of chronic lower GI bleed as well as anemia associated with CKD. - Hold off home BP meds - Echo 55 to 60%.  No RWMA. Mild MR -  Intermittent sinus bradycardia on monitor, patient refused to be evaluated by Cardiology inpatient. Insisted he would like to go home and follow up outpatient - Cardiology referral provided   Elevated troponins -Clinically suspect chest pressure is secondary to demanding ischemia from decompensated anemia and borderline hypotension, now resolved - Resume Eliquis - No indication for ischemic workup at this point, if patient continued to experience chest pain in the future after anemia corrected, then he will need outpatient stress test   Acute on chronic macrocytic anemia secondary to CKD and chronic blood loss Chronic blood loss anemia from lower GI bleed - Normal iron study, likely from patient has been taking multivitamin supplement - Review of patient history showed patient has diverticulosis, and clinically suspect chronic intermittent lower GI bleed associated with severe constipation related diverticulosis bleeding, exacerbated by chronic high-dose narcotics.  - Recommend patient discuss the constipation issue with pain management physician for Relistor therapy, patient agreed with the plan - GI consulted due to drop in Hb to 8 from 14 in 6 months, patient refused any inpatient work up. - Resume Eliquis and GI referral given outpatient - Repeat BMP, CBC in 1 week outpatient  Pancytopenia - retic count normal - If patient continues to have pancytopenia - will need Hematology referral outpatient   HTN - Hold off on BP meds   CKD stage IV - Creatinine level stable - Outpatient follow-up   Hx of DVT - Resume Eliquis   Elevated D-dimer -VQ scan negative   Chronic narcotic dependence -Continue home regimen of oxycodone   Consultants: None Procedures performed: None  Disposition: Home Diet recommendation:  Discharge Diet Orders (From admission, onward)     Start     Ordered   10/27/23 0000  Diet - low sodium heart healthy        10/27/23 1354            DISCHARGE  MEDICATION: Allergies as of 10/27/2023       Reactions   Ivp Dye [iodinated Contrast Media] Anaphylaxis, Other (See Comments)   Breathing difficulty and chest pain   Quinine Shortness Of Breath, Rash, Other (See Comments)   Other reaction(s): SHORTNESS OF BREATH   Amlodipine  Swelling   Other reaction(s): Edema of lower extremity   Aspirin Other (See Comments)   Stomach pain   Gabapentin    Other reaction(s): Dizziness   Glipizide Other (See Comments)   Other reaction(s): Chest pain   Hydrochlorothiazide Other (See Comments)   Other reaction(s): Impotence   Ibuprofen Other (See Comments)   Stomach pain Other Reaction(s): ITCHING,WATERING EYES   Ioxaglate Other (See Comments)   Breathing difficulty and chest pain   Latex Itching   Gloves   Metformin And Related Diarrhea   Nsaids    Kidneys   Omeprazole  Other (See Comments)   Other reaction(s): FAILED THERAPY   Pregabalin Other (See Comments)   Other reaction(s): Dizziness   Doxycycline  Anxiety   Pioglitazone Diarrhea, Other (See Comments)   Other reaction(s): Diarrhea, Abdominal pain   Tape Rash   Some bandages and wraps have started causing rashes.  Unsure if it is the adhesive or the bandage material        Medication List     PAUSE taking these medications    lisinopril  20 MG tablet Wait to take this until your doctor or other care provider tells you to start again. Commonly known as: ZESTRIL  Take 20 mg by mouth daily.       STOP taking these medications    cephALEXin  500 MG capsule Commonly known as: KEFLEX    doxycycline  100 MG tablet Commonly known as: VIBRA -TABS   sildenafil 100 MG tablet Commonly known as: VIAGRA       TAKE these medications    acetaminophen  325 MG tablet Commonly known as: TYLENOL  Take 2 tablets (650 mg total) by mouth every 6 (six) hours as needed. What changed:  when to take this reasons to take this   allopurinol  100 MG tablet Commonly known as: ZYLOPRIM  Take 100  mg by mouth daily.   apixaban 5 MG Tabs tablet Commonly known as: ELIQUIS Take 5 mg by mouth 2 (two) times daily.   butalbital -acetaminophen -caffeine  50-325-40 MG tablet Commonly known as: FIORICET  Take 1-2 tablets by mouth See admin instructions. Take 2 tablets by mouth at start of headache and take 1 additional tablet by mouth after 2 hours if needed - max 3 tablets daily   cholecalciferol  25 MCG (1000 UNIT) tablet Commonly known as: VITAMIN D3 Take 5,000 Units by mouth daily.   clobetasol cream 0.05 % Commonly known as: TEMOVATE Apply 1 application topically daily as needed (eczema). Apply to affected area(s) 2 to 3 times weekly as needed   Coconut Oil 1000 MG Caps Take 2,000 mg by mouth 2 (two) times daily.   colchicine  0.6 MG tablet TAKE 1 TABLET BY MOUTH DAILY What changed:  when to take this reasons to take this   docusate sodium  100 MG capsule Commonly known as: COLACE Take 100 mg by mouth 4 (four) times daily.  empagliflozin  25 MG Tabs tablet Commonly known as: JARDIANCE  Take 12.5 mg by mouth daily.   flunisolide 25 MCG/ACT (0.025%) Soln Commonly known as: NASALIDE PLACE 1 SPRAY INTO EACH NOSTRIL TWICE DAILY   hydrOXYzine  50 MG tablet Commonly known as: ATARAX  TAKE 1 TABLET BY MOUTH EVERY 4 TO 6 HOURS AS NEEDED   ketoconazole 2 % shampoo Commonly known as: NIZORAL Apply 1 application topically once a week.   L-Tryptophan  500 MG Caps Take 500 mg by mouth at bedtime.   latanoprost  0.005 % ophthalmic solution Commonly known as: XALATAN  Place 1 drop into both eyes at bedtime.   lidocaine  5 % Commonly known as: LIDODERM  Place 1 patch onto the skin every 12 (twelve) hours. Remove & Discard patch within 12 hours or as directed by MD   magnesium  oxide 400 MG tablet Commonly known as: MAG-OX Take 400 mg by mouth daily.   methocarbamol  750 MG tablet Commonly known as: ROBAXIN  TAKE 1 TABLET BY MOUTH 4 TIMES DAILY   multivitamin tablet Take 1 tablet by  mouth daily.   naloxone  4 MG/0.1ML Liqd nasal spray kit Commonly known as: NARCAN  Place 1 spray into the nose.   Osteo Bi-Flex Adv Triple St Tabs Take 1 tablet by mouth in the morning and at bedtime.   oxyCODONE  15 MG immediate release tablet Commonly known as: ROXICODONE  Take 15 mg by mouth 5 (five) times daily. Takes 5 times daily (per MD) (1 tablet at 0615, 1 tablet at 1130, 1 tablet at 1730, 1 tablet at 2100 and 1 tablet @ 2300)   oxyCODONE  5 MG immediate release tablet Commonly known as: Roxicodone  Take 1-2 tablets (5-10 mg total) by mouth every 6 (six) hours as needed for moderate pain (pain score 4-6).   pantoprazole  40 MG tablet Commonly known as: PROTONIX  Take 40 mg by mouth 2 (two) times daily.   PAPAV-PHENTOLAMINE-ALPROSTADIL IC 7 mLs by Intracavernosal route as needed. Super Trimix -injection   predniSONE  10 MG tablet Commonly known as: DELTASONE  Take 2 tablets (20 mg total) by mouth daily with breakfast.   rosuvastatin  20 MG tablet Commonly known as: CRESTOR  Take 10 mg by mouth at bedtime.   Saw Palmetto  450 MG Caps Take 900 mg by mouth in the morning and at bedtime.   Semaglutide (1 MG/DOSE) 4 MG/3ML Sopn Inject 1 mg into the skin once a week.   sennosides-docusate sodium  8.6-50 MG tablet Commonly known as: SENOKOT-S Take 2 tablets by mouth 2 (two) times daily.   silver  sulfADIAZINE  1 % cream Commonly known as: Silvadene  Apply 1 application topically daily.   tamsulosin  0.4 MG Caps capsule Commonly known as: FLOMAX  Take 0.4 mg by mouth daily.   Testosterone  20 % Crea Apply 1 Application topically 2 (two) times daily.   timolol  0.5 % ophthalmic solution Commonly known as: TIMOPTIC  Place 1 drop into the left eye 2 (two) times daily.        Discharge Exam: Filed Weights   10/26/23 0409  Weight: 77.1 kg   Constitutional: NAD, calm, comfortable  Neck: normal, supple, no masses, no thyromegaly Respiratory: clear to auscultation  bilaterally, no wheezing, no crackles. Normal respiratory effort. No accessory muscle use.  Cardiovascular: Regular rate and rhythm, no murmurs / rubs / gallops. No extremity edema. 2+ pedal pulses. No carotid bruits.  Abdomen: no tenderness, no masses palpated. No hepatosplenomegaly. Bowel sounds positive.  Musculoskeletal: no clubbing / cyanosis. No joint deformity upper and lower extremities. Good ROM, no contractures. Normal muscle tone.  Skin:  no rashes, lesions, ulcers. No induration Neurologic: CN 2-12 grossly intact. Sensation intact, DTR normal. Strength 5/5 in all 4.  Psychiatric: Normal judgment and insight. Alert and oriented x 3. Normal mood  Condition at discharge: fair  The results of significant diagnostics from this hospitalization (including imaging, microbiology, ancillary and laboratory) are listed below for reference.   Imaging Studies: ECHOCARDIOGRAM COMPLETE Result Date: 10/26/2023    ECHOCARDIOGRAM REPORT   Patient Name:   Xavier Hebert Date of Exam: 10/26/2023 Medical Rec #:  [id number]      Height:       72.0 in Accession #:    [id number]     Weight:       170.0 lb Date of Birth:  1945-10-19      BSA:          1.988 m Patient Age:    78 years       BP:           99/64 mmHg Patient Gender: M              HR:           64 bpm. Exam Location:  [facility] Procedure: 2D Echo, Cardiac Doppler and Color Doppler (Both Spectral and Color            Flow Doppler were utilized during procedure). Indications:     Syncope R55  History:         Patient has no prior history of Echocardiogram examinations.                  CAD, CKD, stage 4, Signs/Symptoms:Syncope; Risk                  Factors:Hypertension, Sleep Apnea, Diabetes and Dyslipidemia.  Sonographer:     Thea Norlander RCS Referring Phys:  [id number] CORT ONEIDA MANA Diagnosing Phys: Lonni Hanson MD IMPRESSIONS  1. Left ventricular ejection fraction, by estimation, is 55 to 60%. The left ventricle has normal function. The left ventricle has  no regional wall motion abnormalities. Left ventricular diastolic parameters were normal.  2. Right ventricular systolic function is normal. The right ventricular size is normal. There is normal pulmonary artery systolic pressure.  3. The mitral valve is normal in structure. Trivial mitral valve regurgitation. No evidence of mitral stenosis.  4. The aortic valve is tricuspid. There is mild calcification of the aortic valve. There is mild thickening of the aortic valve. Aortic valve regurgitation is mild. Aortic valve sclerosis/calcification is present, without any evidence of aortic stenosis.  5. There is mild dilatation of the ascending aorta, measuring 40 mm.  6. The inferior vena cava is dilated in size with <50% respiratory variability, suggesting right atrial pressure of 15 mmHg. FINDINGS  Left Ventricle: Left ventricular ejection fraction, by estimation, is 55 to 60%. The left ventricle has normal function. The left ventricle has no regional wall motion abnormalities. The left ventricular internal cavity size was normal in size. There is  no left ventricular hypertrophy. Left ventricular diastolic parameters were normal. Right Ventricle: The right ventricular size is normal. No increase in right ventricular wall thickness. Right ventricular systolic function is normal. There is normal pulmonary artery systolic pressure. The tricuspid regurgitant velocity is 2.21 m/s, and  with an assumed right atrial pressure of 15 mmHg, the estimated right ventricular systolic pressure is 34.5 mmHg. Left Atrium: Left atrial size was normal in size. Right Atrium: Right atrial size was normal in size. Pericardium: There is  no evidence of pericardial effusion. Mitral Valve: The mitral valve is normal in structure. Trivial mitral valve regurgitation. No evidence of mitral valve stenosis. Tricuspid Valve: The tricuspid valve is normal in structure. Tricuspid valve regurgitation is trivial. No evidence of tricuspid stenosis. Aortic  Valve: The aortic valve is tricuspid. There is mild calcification of the aortic valve. There is mild thickening of the aortic valve. Aortic valve regurgitation is mild. Aortic regurgitation PHT measures 721 msec. Aortic valve sclerosis/calcification is present, without any evidence of aortic stenosis. Aortic valve peak gradient measures 8.5 mmHg. Pulmonic Valve: The pulmonic valve was not well visualized. Pulmonic valve regurgitation is not visualized. No evidence of pulmonic stenosis. Aorta: The aortic root is normal in size and structure. There is mild dilatation of the ascending aorta, measuring 40 mm. Pulmonary Artery: The pulmonary artery is not well seen. Venous: The inferior vena cava is dilated in size with less than 50% respiratory variability, suggesting right atrial pressure of 15 mmHg. IAS/Shunts: No atrial level shunt detected by color flow Doppler.  LEFT VENTRICLE PLAX 2D LVIDd:         4.30 cm   Diastology LVIDs:         2.80 cm   LV e' medial:    10.30 cm/s LV PW:         0.90 cm   LV E/e' medial:  9.9 LV IVS:        0.80 cm   LV e' lateral:   16.00 cm/s LVOT diam:     1.90 cm   LV E/e' lateral: 6.4 LV SV:         53 LV SV Index:   27 LVOT Area:     2.84 cm  RIGHT VENTRICLE             IVC RV S prime:     15.70 cm/s  IVC diam: 2.80 cm TAPSE (M-mode): 2.0 cm LEFT ATRIUM             Index        RIGHT ATRIUM           Index LA diam:        3.20 cm 1.61 cm/m   RA Area:     10.20 cm LA Vol (A2C):   40.5 ml 20.37 ml/m  RA Volume:   19.20 ml  9.66 ml/m LA Vol (A4C):   37.5 ml 18.86 ml/m LA Biplane Vol: 39.4 ml 19.82 ml/m  AORTIC VALVE AV Area (Vmax): 1.74 cm AV Vmax:        146.00 cm/s AV Peak Grad:   8.5 mmHg LVOT Vmax:      89.78 cm/s LVOT Vmean:     56.075 cm/s LVOT VTI:       0.187 m AI PHT:         721 msec  AORTA Ao Root diam: 3.60 cm Ao Asc diam:  4.00 cm MITRAL VALVE                TRICUSPID VALVE MV Area (PHT): 3.27 cm     TR Peak grad:   19.5 mmHg MV Decel Time: 232 msec     TR Vmax:         221.00 cm/s MV E velocity: 102.00 cm/s MV A velocity: 80.70 cm/s   SHUNTS MV E/A ratio:  1.26         Systemic VTI:  0.19 m  Systemic Diam: 1.90 cm Lonni Hanson MD Electronically signed by Lonni Hanson MD Signature Date/Time: 10/26/2023/5:06:18 PM    Final    NM Pulmonary Perfusion Result Date: 10/26/2023 CLINICAL DATA:  Elevated D-dimer. New on set superior ventricular tachycardia. Chest tightness. History of DVT. EXAM: NUCLEAR MEDICINE PERFUSION LUNG SCAN TECHNIQUE: Perfusion images were obtained in multiple projections after intravenous injection of radiopharmaceutical. Ventilation scans intentionally deferred if perfusion scan and chest x-ray adequate for interpretation during COVID 19 epidemic. RADIOPHARMACEUTICALS:  4.4 mCi Tc-18m MAA IV COMPARISON:  Chest x-ray 10/25/2023 FINDINGS: No peripheral segmental perfusion defect evident in either lung. IMPRESSION: Negative for acute pulmonary embolus. Electronically Signed   By: Camellia Candle M.D.   On: 10/26/2023 08:42   DG Chest Portable 1 View Result Date: 10/25/2023 CLINICAL DATA:  Chest pressure and shortness of breath EXAM: PORTABLE CHEST 1 VIEW COMPARISON:  10/13/2017 FINDINGS: Stable cardiomediastinal silhouette. Aortic atherosclerotic calcification. No focal consolidation, pleural effusion, or pneumothorax. No displaced rib fractures. Right reverse TSA. IMPRESSION: No active disease. Electronically Signed   By: Norman Gatlin M.D.   On: 10/25/2023 23:25    Microbiology: Results for orders placed or performed during the hospital encounter of 11/24/14  Surgical pcr screen     Status: None   Collection Time: 11/24/14  3:37 PM   Specimen: Nasal Mucosa; Nasal Swab  Result Value Ref Range Status   MRSA, PCR NEGATIVE NEGATIVE Final   Staphylococcus aureus NEGATIVE NEGATIVE Final    Comment:        The Xpert SA Assay (FDA approved for NASAL specimens in patients over 69 years of age), is one component of a  comprehensive surveillance program.  Test performance has been validated by [facility] for patients greater than or equal to 47 year old. It is not intended to diagnose infection nor to guide or monitor treatment.     Labs: CBC: Recent Labs  Lab 10/25/23 2237 10/26/23 0738 10/26/23 1453 10/26/23 1919 10/27/23 0332  WBC 3.5* 4.2  --   --  3.0*  NEUTROABS 2.5  --   --   --   --   HGB 8.5* 8.0* 7.8* 8.1* 7.7*  HCT 26.2* 25.3* 24.3* 24.8* 23.6*  MCV 102.7* 103.7*  --   --  101.7*  PLT 132* 131*  --   --  117*   Basic Metabolic Panel: Recent Labs  Lab 10/25/23 2237 10/26/23 0738  NA 140 142  K 4.4 4.2  CL 113* 114*  CO2 20* 19*  GLUCOSE 160* 135*  BUN 59* 55*  CREATININE 2.64* 2.39*  CALCIUM  8.6* 8.3*  MG 1.8  --    Liver Function Tests: Recent Labs  Lab 10/25/23 2237  AST 17  ALT 19  ALKPHOS 83  BILITOT 0.8  PROT 6.4*  ALBUMIN  3.7   CBG: No results for input(s): GLUCAP in the last 168 hours.  Discharge time spent: greater than 30 minutes.  Signed: Laree Lock, MD [facility] 10/27/2023

## 2023-10-27 NOTE — Progress Notes (Signed)
 Patient already has home meds in his possession.

## 2023-10-27 NOTE — Care Management CC44 (Signed)
 Condition Code 44 Documentation Completed  Patient Details  Name: JAMIER URBAS MRN: [id number] Date of Birth: [birth year]   Condition Code 44 given:  Yes Patient signature on Condition Code 44 notice:  Yes Documentation of 2 MD's agreement:  Yes Code 44 added to claim:  Yes    [place] L Janaiah Vetrano, LCSW 10/27/2023, 3:33 PM

## 2023-10-30 ENCOUNTER — Ambulatory Visit: Admitting: Family

## 2023-11-06 NOTE — Progress Notes (Unsigned)
  Electrophysiology Office Note:    Date:  11/07/2023   ID:  Xavier Hebert, DOB Jan 25, 1946, MRN [id number]  [facility] Cardiologist:  None  [facility] Electrophysiologist:  OLE ONEIDA HOLTS, MD   Referring MD: Jerelene Critchley, MD   Chief Complaint: SVT  History of Present Illness:    Xavier Hebert is a 78 year old man who I am seeing today for an evaluation of SVT at the request of Dr. Rhunette.  The patient has a history of DVT on Eliquis, CKD 4, chronic anemia, chronic pain syndrome, narcotic dependence, hypertension, hyperlipidemia.  He was admitted earlier this month with chest pressure and shortness of breath.  He was also near syncopal.  When EMS arrived to his location he was found to be in SVT.  Valsalva maneuvers successfully converted him back to to sinus rhythm.  Workup revealed a hemoglobin of 8.5.  The patient declined further evaluation in the hospital.  Today he tells me he feels weak and tired.  He is short of breath with minimal exertion.  He also feels lightheaded and dizzy at times.  He is worried about his relatively low blood pressure.  That is atypical for him.  He tells me that he has a colonoscopy scheduled with the [facility] but that is weeks away.    Their past medical, social and family history was reviewed.   ROS:   Please see the history of present illness.    All other systems reviewed and are negative.  EKGs/Labs/Other Studies Reviewed:    The following studies were reviewed today:  October 26, 2023 echo EF 55% RV normal Trivial MR  October 25, 2023 EMS run sheet SVT, narrow complex, rate 168 bpm     October 25, 2023 EKG shows sinus rhythm.      Physical Exam:    VS:  BP (!) 97/56   Pulse 91   Ht 6' (1.829 m)   Wt 167 lb (75.8 kg)   SpO2 93%   BMI 22.65 kg/m     Wt Readings from Last 3 Encounters:  11/07/23 167 lb (75.8 kg)  10/26/23 170 lb (77.1 kg)  10/09/23 169 lb (76.7 kg)     GEN: no distress.  Elderly.  Weak. CARD: RRR, No  MRG RESP: No IWOB. CTAB.        ASSESSMENT AND PLAN:    1. SVT (supraventricular tachycardia) (HCC)   2. Pancytopenia (HCC)    #Weakness and fatigue #Pancytopenia I am concerned about his symptoms and most recent hemoglobin of 7.7 on August 9.  I think this triggered his SVT.  I am concerned that he has a progressively worsening anemia that would benefit from blood transfusion and further evaluation.  Given his symptoms, I have recommended presentation to the emergency department for further evaluation and treatment.  He is okay with this plan.  #SVT Symptomatic.  Possibly triggered from his severe anemia.  Unclear mechanism.  If he has recurrent arrhythmia, consider starting metoprolol succinate 25 mg by mouth once daily.  Will hold off on this for now.   Signed, OLE ONEIDA. HOLTS, MD, [facility], [facility] 11/07/2023 2:08 PM    Electrophysiology [facility]

## 2023-11-07 ENCOUNTER — Ambulatory Visit: Attending: Cardiology | Admitting: Cardiology

## 2023-11-07 ENCOUNTER — Emergency Department
Admission: EM | Admit: 2023-11-07 | Discharge: 2023-11-07 | Disposition: A | Discharge status: Home / Self Care | Attending: Emergency Medicine | Admitting: Emergency Medicine

## 2023-11-07 ENCOUNTER — Other Ambulatory Visit: Payer: Self-pay

## 2023-11-07 ENCOUNTER — Encounter: Payer: Self-pay | Admitting: Emergency Medicine

## 2023-11-07 ENCOUNTER — Ambulatory Visit: Admitting: Family

## 2023-11-07 ENCOUNTER — Encounter: Payer: Self-pay | Admitting: Cardiology

## 2023-11-07 ENCOUNTER — Emergency Department

## 2023-11-07 VITALS — BP 97/56 | HR 91 | Ht 72.0 in | Wt 167.0 lb

## 2023-11-07 DIAGNOSIS — E1122 Type 2 diabetes mellitus with diabetic chronic kidney disease: Secondary | ICD-10-CM | POA: Diagnosis not present

## 2023-11-07 DIAGNOSIS — N189 Chronic kidney disease, unspecified: Secondary | ICD-10-CM | POA: Diagnosis not present

## 2023-11-07 DIAGNOSIS — R0602 Shortness of breath: Secondary | ICD-10-CM | POA: Diagnosis present

## 2023-11-07 DIAGNOSIS — I129 Hypertensive chronic kidney disease with stage 1 through stage 4 chronic kidney disease, or unspecified chronic kidney disease: Secondary | ICD-10-CM | POA: Diagnosis not present

## 2023-11-07 DIAGNOSIS — D638 Anemia in other chronic diseases classified elsewhere: Secondary | ICD-10-CM | POA: Diagnosis not present

## 2023-11-07 DIAGNOSIS — D61818 Other pancytopenia: Secondary | ICD-10-CM | POA: Insufficient documentation

## 2023-11-07 DIAGNOSIS — I471 Supraventricular tachycardia, unspecified: Secondary | ICD-10-CM | POA: Insufficient documentation

## 2023-11-07 LAB — CBC
HCT: 27.4 % — ABNORMAL LOW (ref 39.0–52.0)
Hemoglobin: 8.8 g/dL — ABNORMAL LOW (ref 13.0–17.0)
MCH: 33.6 pg (ref 26.0–34.0)
MCHC: 32.1 g/dL (ref 30.0–36.0)
MCV: 104.6 fL — ABNORMAL HIGH (ref 80.0–100.0)
Platelets: 148 K/uL — ABNORMAL LOW (ref 150–400)
RBC: 2.62 MIL/uL — ABNORMAL LOW (ref 4.22–5.81)
RDW: 13.9 % (ref 11.5–15.5)
WBC: 5.1 K/uL (ref 4.0–10.5)
nRBC: 0 % (ref 0.0–0.2)

## 2023-11-07 LAB — BASIC METABOLIC PANEL WITH GFR
Anion gap: 9 (ref 5–15)
BUN: 53 mg/dL — ABNORMAL HIGH (ref 8–23)
CO2: 22 mmol/L (ref 22–32)
Calcium: 9 mg/dL (ref 8.9–10.3)
Chloride: 108 mmol/L (ref 98–111)
Creatinine, Ser: 2.23 mg/dL — ABNORMAL HIGH (ref 0.61–1.24)
GFR, Estimated: 30 mL/min — ABNORMAL LOW (ref >60)
Glucose, Bld: 141 mg/dL — ABNORMAL HIGH (ref 70–99)
Potassium: 4.8 mmol/L (ref 3.5–5.1)
Sodium: 139 mmol/L (ref 135–145)

## 2023-11-07 LAB — TYPE AND SCREEN
ABO/RH(D): A POS
Antibody Screen: NEGATIVE

## 2023-11-07 LAB — TROPONIN I (HIGH SENSITIVITY): Troponin I (High Sensitivity): 3 ng/L (ref <18)

## 2023-11-07 NOTE — ED Provider Notes (Signed)
 [facility] Provider Note   Event Date/Time   First MD Initiated Contact with Patient 11/07/23 1636     (approximate) History  Weakness and Abnormal Lab  HPI Xavier Hebert is a 78 y.o. male with a stated past medical history of hypertension, hyperlipidemia, anxiety/depression, type 2 diabetes, and CKD who presents after a heart care visit today for weakness and shortness of breath where he was found to be in SVT and converted due to low blood pressure.  Patient was found to have a hemoglobin of 7.7 and told to present to the emergency department for the possibility of transfusion.  Patient states that his weakness and shortness of breath have improved after cardioversion.  Patient denies any dark tarry stools, rectal bleeding, nosebleeds, or hematemesis ROS: Patient currently denies any vision changes, tinnitus, difficulty speaking, facial droop, sore throat, chest pain, shortness of breath, abdominal pain, nausea/vomiting/diarrhea, dysuria, or numbness/paresthesias in any extremity   Physical Exam  Triage Vital Signs: ED Triage Vitals  Encounter Vitals Group     BP 11/07/23 1425 117/66     Girls Systolic BP Percentile --      Girls Diastolic BP Percentile --      Boys Systolic BP Percentile --      Boys Diastolic BP Percentile --      Pulse Rate 11/07/23 1425 78     Resp 11/07/23 1425 18     Temp 11/07/23 1425 100 F (37.8 C)     Temp Source 11/07/23 1425 Oral     SpO2 11/07/23 1425 100 %     Weight 11/07/23 1426 167 lb (75.8 kg)     Height 11/07/23 1426 6' (1.829 m)     Head Circumference --      Peak Flow --      Pain Score 11/07/23 1426 0     Pain Loc --      Pain Education --      Exclude from Growth Chart --    Most recent vital signs: Vitals:   11/07/23 1425 11/07/23 1739  BP: 117/66 126/66  Pulse: 78 78  Resp: 18 17  Temp: 100 F (37.8 C)   SpO2: 100% 98%   General: Awake, oriented x4. CV:  Good peripheral perfusion. Resp:  Normal  effort. Abd:  No distention. Other:  Elderly well-developed, well-nourished Caucasian male resting comfortably in no acute distress ED Results / Procedures / Treatments  Labs (all labs ordered are listed, but only abnormal results are displayed) Labs Reviewed  BASIC METABOLIC PANEL WITH GFR - Abnormal; Notable for the following components:      Result Value   Glucose, Bld 141 (*)    BUN 53 (*)    Creatinine, Ser 2.23 (*)    GFR, Estimated 30 (*)    All other components within normal limits  CBC - Abnormal; Notable for the following components:   RBC 2.62 (*)    Hemoglobin 8.8 (*)    HCT 27.4 (*)    MCV 104.6 (*)    Platelets 148 (*)    All other components within normal limits  TYPE AND SCREEN  TROPONIN I (HIGH SENSITIVITY)  TROPONIN I (HIGH SENSITIVITY)   EKG ED ECG REPORT I, Artist MARLA Kerns, the attending physician, personally viewed and interpreted this ECG. Date: 11/07/2023 EKG Time: 1427 Rate: 83 Rhythm: normal sinus rhythm QRS Axis: normal Intervals: normal ST/T Wave abnormalities: normal Narrative Interpretation: no evidence of acute ischemia RADIOLOGY ED MD interpretation:  2 view chest x-ray interpreted by me shows no evidence of acute abnormalities including no pneumonia, pneumothorax, or widened mediastinum - All radiology independently interpreted and agree with radiology assessment Official radiology report(s): DG Chest 2 View Result Date: 11/07/2023 CLINICAL DATA:  Chest pain. EXAM: CHEST - 2 VIEW COMPARISON:  10/25/2023. FINDINGS: The heart size and mediastinal contours are unchanged. No focal consolidation, sizeable pleural effusion, or pneumothorax. Right reverse total shoulder arthroplasty. No acute osseous abnormality. IMPRESSION: No acute cardiopulmonary findings. Electronically Signed   By: Harrietta Sherry M.D.   On: 11/07/2023 15:22   PROCEDURES: Critical Care performed: No Procedures MEDICATIONS ORDERED IN ED: Medications - No data to  display IMPRESSION / MDM / ASSESSMENT AND PLAN / ED COURSE  I reviewed the triage vital signs and the nursing notes.                             The patient is on the cardiac monitor to evaluate for evidence of arrhythmia and/or significant heart rate changes. Patient's presentation is most consistent with acute presentation with potential threat to life or bodily function. Patient is a 78 year old male who presents via EMS from cardiologist office today after being cardioverted from SVT with concerns of low hemoglobin.  Differential diagnosis includes but is not limited to: GI bleeding, anemia of chronic disease, aplastic anemia Plan: CBC, CMP, chest x-ray, EKG, type and screen, transfusion as needed CBC shows patient's hemoglobin at 8.8 and therefore no need for transfusion today.  Patient has abnormal creatinine at 2.2 which is around his baseline. Dispo: Discharge home with PCP and cardiology follow-up Clinical Course as of 11/07/23 1858  Wed Nov 07, 2023  8142 The patient has been reexamined and is ready to be discharged.  All diagnostic results have been reviewed and discussed with the patient/family.  Care plan has been outlined and the patient/family understands all current diagnoses, results, and treatment plans.  There are no new complaints, changes, or physical findings at this time.  All questions have been addressed and answered.  Patient was instructed to, and agrees to follow-up with their primary care physician as well as return to the emergency department if any new or worsening symptoms develop. [EB]    Clinical Course User Index [EB] Jossie Artist POUR, MD   FINAL CLINICAL IMPRESSION(S) / ED DIAGNOSES   Final diagnoses:  Chronic disease anemia   Rx / DC Orders   ED Discharge Orders     None      Note:  This document was prepared using Dragon voice recognition software and may include unintentional dictation errors.   Jossie Artist POUR, MD 11/07/23 [phone]

## 2023-11-07 NOTE — ED Triage Notes (Signed)
 Came in for EP study today.  Patient sent to ED for possible transfusion Hbg 7.7.  Patient initially in SVT at EP.  Patient is AAOx3.  Skin warm and dry. NAD

## 2023-11-07 NOTE — Patient Instructions (Signed)
 Medication Instructions:  Your physician recommends that you continue on your current medications as directed. Please refer to the Current Medication list given to you today.  *If you need a refill on your cardiac medications before your next appointment, please call your pharmacy*  Follow-Up: At [facility], you and your health needs are our priority.  As part of our continuing mission to provide you with exceptional heart care, our providers are all part of one team.  This team includes your primary Cardiologist (physician) and Advanced Practice Providers or APPs (Physician Assistants and Nurse Practitioners) who all work together to provide you with the care you need, when you need it.  Your next appointment:   Please proceed to the Emergency Room

## 2023-11-07 NOTE — ED Triage Notes (Signed)
 Patient to ED via [facility] for weakness/SOB. Sent to ED for possible blood transfusion- hgb 7.7. PT initially in SVT at office but converted prior to arrival to ED. Denies CP. AOx4

## 2023-11-23 ENCOUNTER — Ambulatory Visit (INDEPENDENT_AMBULATORY_CARE_PROVIDER_SITE_OTHER): Admitting: Cardiology

## 2023-11-23 ENCOUNTER — Other Ambulatory Visit (INDEPENDENT_AMBULATORY_CARE_PROVIDER_SITE_OTHER): Payer: Self-pay

## 2023-11-23 ENCOUNTER — Encounter: Payer: Self-pay | Admitting: Cardiology

## 2023-11-23 VITALS — BP 120/58 | HR 56 | Ht 72.0 in | Wt 170.8 lb

## 2023-11-23 DIAGNOSIS — E538 Deficiency of other specified B group vitamins: Secondary | ICD-10-CM | POA: Diagnosis not present

## 2023-11-23 DIAGNOSIS — L259 Unspecified contact dermatitis, unspecified cause: Secondary | ICD-10-CM

## 2023-11-23 MED ORDER — CYANOCOBALAMIN 1000 MCG/ML IJ SOLN
1000.0000 ug | Freq: Once | INTRAMUSCULAR | Status: AC
Start: 2023-11-23 — End: 2023-11-23
  Administered 2023-11-23: 1000 ug via INTRAMUSCULAR

## 2023-11-23 MED ORDER — TRIAMCINOLONE ACETONIDE 0.5 % EX OINT
1.0000 | TOPICAL_OINTMENT | Freq: Two times a day (BID) | CUTANEOUS | 0 refills | Status: AC
Start: 2023-11-23 — End: ?

## 2023-11-23 MED ORDER — TRIAMCINOLONE ACETONIDE 0.5 % EX OINT: 1.0000 | TOPICAL_OINTMENT | Freq: Two times a day (BID) | CUTANEOUS | 0 refills | Status: DC

## 2023-11-23 NOTE — Progress Notes (Signed)
 Established Patient Office Visit  Subjective:  Patient ID: Xavier Hebert, male    DOB: 1946/01/19  Age: 78 y.o. MRN: [id number]  Chief Complaint  Patient presents with   Follow-up    Rash     Patient in office for an acute visit, complaining of a rash. Patient has a diffuse rash on his right lower back, and also left upper arm. Patient first noticed it about a month ago, getting worse. Patient complains of itchiness and redness. Has tried triple antibiotic ointment with no change.  Will send in triamcinolone  cream.   Rash This is a new problem. The current episode started more than 1 month ago. The problem has been gradually worsening since onset. The affected locations include the back. The rash is characterized by itchiness and redness. Pertinent negatives include no diarrhea, joint pain or shortness of breath. Past treatments include antibiotic cream. The treatment provided no relief.    No other concerns at this time.   Past Medical History:  Diagnosis Date   Age-related nuclear cataract of both eyes 01/21/2022   Anxiety    Arthritis    Chronic kidney disease, stage 3a (HCC) 08/16/2022   Aug 16, 2022 Entered By: RONNETTE MAXWELL A Comment: b/l Cr ~2-2.2   Chronic pain syndrome    CKD (chronic kidney disease) stage 3, GFR 30-59 ml/min (HCC)    Depression    Diabetes mellitus without complication (HCC)    Type II   DVT (deep venous thrombosis) (HCC)    Left lower leg   Episodic tension-type headache, not intractable 08/06/2022   Exposure to potentially hazardous substance 08/16/2022   Jul 05, 2022 Entered By: ROSANA DRAGON Comment: Entered automatically through [facility] Problem List documentation program   GERD (gastroesophageal reflux disease)    Headache    Migraine- headache   Hiatal hernia    History of urinary stone 08/06/2022   Hyperlipidemia    Hypertension    Kidney stones    Neck stiffness    limited up and down motion   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, unspecified laterality 11/27/2019   Peptic ulcer    Sleep apnea    does not use cpap/ pt states he lost 60#   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) 07/17/2019   Status post laser cataract surgery of both eyes 05/10/2022   Unspecified glaucoma 08/06/2022   Wears hearing aid in both ears    Has, does not wear    Past Surgical History:  Procedure Laterality Date   ANTERIOR CERVICAL DECOMP/DISCECTOMY FUSION  2009   CARPAL TUNNEL RELEASE Left 01/03/2018   Procedure: CARPAL TUNNEL RELEASE;  Surgeon: Kathlynn Sharper, MD;  Location: [facility];  Service: Orthopedics;  Laterality: Left;   CARPAL TUNNEL RELEASE Right 07/17/2023   Procedure: CARPAL TUNNEL RELEASE;  Surgeon: Kathlynn Sharper, MD;  Location: [facility];  Service: Orthopedics;  Laterality: Right;   CARPOMETACARPAL (CMC) FUSION OF THUMB Left 08/10/2020   Procedure: CARPOMETACARPAL [facility]) FUSION OF THUMB;  Surgeon: Kathlynn Sharper, MD;  Location: [facility];  Service: Orthopedics;  Laterality: Left;   COLONOSCOPY WITH PROPOFOL  N/A 11/21/2017   Procedure: COLONOSCOPY WITH PROPOFOL ;  Surgeon: Toledo, Ladell POUR, MD;  Location: [facility];  Service: Gastroenterology;  Laterality: N/A;   CYSTOSCOPY     x3- 2 times for stone removal    ESOPHAGOGASTRODUODENOSCOPY N/A 10/14/2017   Procedure: ESOPHAGOGASTRODUODENOSCOPY (EGD);  Surgeon: Unk Corinn Skiff, MD;  Location: [facility];  Service:  Gastroenterology;  Laterality: N/A;   ESOPHAGOGASTRODUODENOSCOPY (EGD) WITH PROPOFOL  N/A 10/25/2017   Procedure: ESOPHAGOGASTRODUODENOSCOPY (EGD) WITH PROPOFOL ;  Surgeon: Unk Corinn Skiff, MD;  Location: [facility];  Service: Gastroenterology;  Laterality: N/A;   JOINT REPLACEMENT     bil. knees , shoulder rt, and neck    REVERSE SHOULDER ARTHROPLASTY Right 12/01/2014   REVERSE SHOULDER ARTHROPLASTY Right 12/01/2014   Procedure: REVERSE SHOULDER ARTHROPLASTY;  Surgeon: Glendia Cordella Hutchinson, MD;   Location: [facility];  Service: Orthopedics;  Laterality: Right;   SHOULDER ARTHROSCOPY W/ ROTATOR CUFF REPAIR Right 06/10/2014   failed   SHOULDER OPEN ROTATOR CUFF REPAIR Left ~ 2008   TOTAL KNEE ARTHROPLASTY Bilateral 2005-2008   right-left   TRIGGER FINGER RELEASE Right 2001   TRIGGER FINGER RELEASE Left 08/10/2020   Procedure: RELEASE TRIGGER FINGER/A-1 PULLEY;  Surgeon: Kathlynn Sharper, MD;  Location: [facility];  Service: Orthopedics;  Laterality: Left;   ULNAR TUNNEL RELEASE Right 07/17/2023   Procedure: RELEASE, CUBITAL TUNNEL;  Surgeon: Kathlynn Sharper, MD;  Location: [facility];  Service: Orthopedics;  Laterality: Right;   URETERAL STENT PLACEMENT      Social History   Socioeconomic History   Marital status: Married    Spouse name: Not on file   Number of children: Not on file   Years of education: Not on file   Highest education level: Not on file  Occupational History   Not on file  Tobacco Use   Smoking status: Never   Smokeless tobacco: Never  Vaping Use   Vaping status: Never Used  Substance and Sexual Activity   Alcohol use: Never   Drug use: No   Sexual activity: Not Currently  Other Topics Concern   Not on file  Social History Narrative   Lives at home with his wife. Independent at baseline.   Social Drivers of Corporate investment banker Strain: Low Risk  (05/31/2023)   Received from [facility]   Overall Financial Resource Strain (CARDIA)    Difficulty of Paying Living Expenses: Not hard at all  Food Insecurity: No Food Insecurity (05/31/2023)   Received from [facility]   Hunger Vital Sign    Within the past 12 months, you worried that your food would run out before you got the money to buy more.: Never true    Within the past 12 months, the food you bought just didn't last and you didn't have money to get more.: Never true  Transportation Needs: No Transportation Needs (05/31/2023)   Received from [facility]    In the past 12 months, has lack of transportation kept you from medical appointments or from getting medications?: No    Lack of Transportation (Non-Medical): No  Physical Activity: Not on file  Stress: Not on file  Social Connections: Not on file  Intimate Partner Violence: Not on file    Family History  Problem Relation Age of Onset   Dementia Mother    Alcohol abuse Father     Allergies  Allergen Reactions   Ivp Dye [Iodinated Contrast Media] Anaphylaxis and Other (See Comments)    Breathing difficulty and chest pain   Quinine Shortness Of Breath, Rash and Other (See Comments)    Other reaction(s): SHORTNESS OF BREATH   Amlodipine  Swelling    Other reaction(s): Edema of lower extremity   Aspirin Other (See Comments)    Stomach pain   Gabapentin  Other reaction(s): Dizziness   Glipizide Other (See Comments)    Other reaction(s): Chest pain   Hydrochlorothiazide Other (See Comments)    Other reaction(s): Impotence   Ibuprofen Other (See Comments)    Stomach pain  Other Reaction(s): ITCHING,WATERING EYES   Ioxaglate Other (See Comments)    Breathing difficulty and chest pain   Latex Itching    Gloves   Metformin And Related Diarrhea   Nsaids     Kidneys   Omeprazole  Other (See Comments)    Other reaction(s): FAILED THERAPY   Pregabalin Other (See Comments)    Other reaction(s): Dizziness   Doxycycline  Anxiety   Pioglitazone Diarrhea and Other (See Comments)    Other reaction(s): Diarrhea, Abdominal pain   Tape Rash    Some bandages and wraps have started causing rashes.  Unsure if it is the adhesive or the bandage material    Outpatient Medications Prior to Visit  Medication Sig   acetaminophen  (TYLENOL ) 325 MG tablet Take 2 tablets (650 mg total) by mouth every 6 (six) hours as needed.   allopurinol  (ZYLOPRIM ) 100 MG tablet Take 100 mg by mouth daily.    apixaban (ELIQUIS) 5 MG TABS tablet Take 5 mg by mouth 2  (two) times daily.   butalbital -acetaminophen -caffeine  (FIORICET , ESGIC ) 50-325-40 MG per tablet Take 1-2 tablets by mouth See admin instructions. Take 2 tablets by mouth at start of headache and take 1 additional tablet by mouth after 2 hours if needed - max 3 tablets daily   cholecalciferol  (VITAMIN D ) 1000 units tablet Take 5,000 Units by mouth daily.   clobetasol cream (TEMOVATE) 0.05 % Apply 1 application topically daily as needed (eczema). Apply to affected area(s) 2 to 3 times weekly as needed   Coconut Oil 1000 MG CAPS Take 2,000 mg by mouth 2 (two) times daily. (Patient not taking: Reported on 11/07/2023)   colchicine  0.6 MG tablet TAKE 1 TABLET BY MOUTH DAILY (Patient taking differently: Take 0.6 mg by mouth daily as needed.)   docusate sodium  (COLACE) 100 MG capsule Take 100 mg by mouth 4 (four) times daily. (Patient not taking: Reported on 11/07/2023)   empagliflozin  (JARDIANCE ) 25 MG TABS tablet Take 12.5 mg by mouth daily.   flunisolide (NASALIDE) 25 MCG/ACT (0.025%) SOLN PLACE 1 SPRAY INTO EACH NOSTRIL TWICE DAILY   hydrOXYzine  (ATARAX ) 50 MG tablet TAKE 1 TABLET BY MOUTH EVERY 4 TO 6 HOURS AS NEEDED   ketoconazole (NIZORAL) 2 % shampoo Apply 1 application topically once a week.  (Patient not taking: Reported on 11/07/2023)   L-Tryptophan  500 MG CAPS Take 500 mg by mouth at bedtime. (Patient not taking: Reported on 11/07/2023)   latanoprost  (XALATAN ) 0.005 % ophthalmic solution Place 1 drop into both eyes at bedtime.   lidocaine  (LIDODERM ) 5 % Place 1 patch onto the skin every 12 (twelve) hours. Remove & Discard patch within 12 hours or as directed by MD   [Paused] lisinopril  (ZESTRIL ) 20 MG tablet Take 20 mg by mouth daily.   magnesium  oxide (MAG-OX) 400 MG tablet Take 400 mg by mouth daily.   methocarbamol  (ROBAXIN ) 750 MG tablet TAKE 1 TABLET BY MOUTH 4 TIMES DAILY   Misc Natural Products (OSTEO BI-FLEX ADV TRIPLE ST) TABS Take 1 tablet by mouth in the morning and at bedtime.    Multiple Vitamin (MULTIVITAMIN) tablet Take 1 tablet by mouth daily.   naloxone  (NARCAN ) nasal spray 4 mg/0.1 mL Place 1 spray into the nose.   oxyCODONE  (ROXICODONE ) 15 MG immediate release  tablet Take 15 mg by mouth 5 (five) times daily. Takes 5 times daily (per MD) (1 tablet at 0615, 1 tablet at 1130, 1 tablet at 1730, 1 tablet at 2100 and 1 tablet @ 2300)   oxyCODONE  (ROXICODONE ) 5 MG immediate release tablet Take 1-2 tablets (5-10 mg total) by mouth every 6 (six) hours as needed for moderate pain (pain score 4-6).   pantoprazole  (PROTONIX ) 40 MG tablet Take 40 mg by mouth 2 (two) times daily.    PAPAV-PHENTOLAMINE-ALPROSTADIL IC 7 mLs by Intracavernosal route as needed. Super Trimix -injection   predniSONE  (DELTASONE ) 10 MG tablet Take 2 tablets (20 mg total) by mouth daily with breakfast. (Patient not taking: Reported on 11/07/2023)   rosuvastatin  (CRESTOR ) 20 MG tablet Take 10 mg by mouth at bedtime.    Saw Palmetto  450 MG CAPS Take 900 mg by mouth in the morning and at bedtime.   Semaglutide, 1 MG/DOSE, 4 MG/3ML SOPN Inject 1 mg into the skin once a week.   sennosides-docusate sodium  (SENOKOT-S) 8.6-50 MG tablet Take 2 tablets by mouth 2 (two) times daily.   silver  sulfADIAZINE  (SILVADENE ) 1 % cream Apply 1 application topically daily. (Patient not taking: Reported on 11/07/2023)   tamsulosin  (FLOMAX ) 0.4 MG CAPS capsule Take 0.4 mg by mouth daily.   Testosterone  20 % CREA Apply 1 Application topically 2 (two) times daily. (Patient not taking: Reported on 11/07/2023)   timolol  (TIMOPTIC ) 0.5 % ophthalmic solution Place 1 drop into the left eye 2 (two) times daily.   No facility-administered medications prior to visit.    Review of Systems  Constitutional: Negative.   HENT: Negative.    Eyes: Negative.   Respiratory: Negative.  Negative for shortness of breath.   Cardiovascular: Negative.  Negative for chest pain.  Gastrointestinal: Negative.  Negative for abdominal pain, constipation  and diarrhea.  Genitourinary: Negative.   Musculoskeletal:  Negative for joint pain and myalgias.  Skin:  Positive for rash.  Neurological: Negative.  Negative for dizziness and headaches.  Endo/Heme/Allergies: Negative.   All other systems reviewed and are negative.      Objective:   BP (!) 120/58   Pulse (!) 56   Ht 6' (1.829 m)   Wt 170 lb 12.8 oz (77.5 kg)   SpO2 99%   BMI 23.16 kg/m   Vitals:   11/23/23 1430  BP: (!) 120/58  Pulse: (!) 56  Height: 6' (1.829 m)  Weight: 170 lb 12.8 oz (77.5 kg)  SpO2: 99%  BMI (Calculated): 23.16    Physical Exam Nursing note reviewed.  Constitutional:      Appearance: Normal appearance. He is normal weight.  HENT:     Head: Normocephalic and atraumatic.     Nose: Nose normal.     Mouth/Throat:     Mouth: Mucous membranes are moist.     Pharynx: Oropharynx is clear.  Eyes:     Extraocular Movements: Extraocular movements intact.     Conjunctiva/sclera: Conjunctivae normal.     Pupils: Pupils are equal, round, and reactive to light.  Cardiovascular:     Rate and Rhythm: Normal rate and regular rhythm.     Pulses: Normal pulses.     Heart sounds: Normal heart sounds.  Pulmonary:     Effort: Pulmonary effort is normal.     Breath sounds: Normal breath sounds.  Abdominal:     General: Abdomen is flat. Bowel sounds are normal.     Palpations: Abdomen is soft.  Musculoskeletal:  General: Normal range of motion.     Cervical back: Normal range of motion.  Skin:    General: Skin is warm and dry.  Neurological:     General: No focal deficit present.     Mental Status: He is alert and oriented to person, place, and time.  Psychiatric:        Mood and Affect: Mood normal.        Behavior: Behavior normal.        Thought Content: Thought content normal.        Judgment: Judgment normal.      No results found for any visits on 11/23/23.  Recent Results (from the past 2160 hours)  CBC with Differential/Platelet      Status: Abnormal   Collection Time: 10/25/23 10:37 PM  Result Value Ref Range   WBC 3.5 (L) 4.0 - 10.5 K/uL   RBC 2.55 (L) 4.22 - 5.81 MIL/uL   Hemoglobin 8.5 (L) 13.0 - 17.0 g/dL   HCT 73.7 (L) 60.9 - 47.9 %   MCV 102.7 (H) 80.0 - 100.0 fL   MCH 33.3 26.0 - 34.0 pg   MCHC 32.4 30.0 - 36.0 g/dL   RDW 85.0 88.4 - 84.4 %   Platelets 132 (L) 150 - 400 K/uL   nRBC 0.0 0.0 - 0.2 %   Neutrophils Relative % 70 %   Neutro Abs 2.5 1.7 - 7.7 K/uL   Lymphocytes Relative 16 %   Lymphs Abs 0.6 (L) 0.7 - 4.0 K/uL   Monocytes Relative 9 %   Monocytes Absolute 0.3 0.1 - 1.0 K/uL   Eosinophils Relative 4 %   Eosinophils Absolute 0.1 0.0 - 0.5 K/uL   Basophils Relative 1 %   Basophils Absolute 0.0 0.0 - 0.1 K/uL   Immature Granulocytes 0 %   Abs Immature Granulocytes 0.01 0.00 - 0.07 K/uL    Comment: Performed at [facility], [address] [place], [place] [address]  Comprehensive metabolic panel     Status: Abnormal   Collection Time: 10/25/23 10:37 PM  Result Value Ref Range   Sodium 140 135 - 145 mmol/L   Potassium 4.4 3.5 - 5.1 mmol/L   Chloride 113 (H) 98 - 111 mmol/L   CO2 20 (L) 22 - 32 mmol/L   Glucose, Bld 160 (H) 70 - 99 mg/dL    Comment: Glucose reference range applies only to samples taken after fasting for at least 8 hours.   BUN 59 (H) 8 - 23 mg/dL   Creatinine, Ser 7.35 (H) 0.61 - 1.24 mg/dL   Calcium  8.6 (L) 8.9 - 10.3 mg/dL   Total Protein 6.4 (L) 6.5 - 8.1 g/dL   Albumin  3.7 3.5 - 5.0 g/dL   AST 17 15 - 41 U/L   ALT 19 0 - 44 U/L   Alkaline Phosphatase 83 38 - 126 U/L   Total Bilirubin 0.8 0.0 - 1.2 mg/dL   GFR, Estimated 24 (L) >60 mL/min    Comment: (NOTE) Calculated using the CKD-EPI Creatinine Equation (2021)    Anion gap 7 5 - 15    Comment: Performed at [facility], [address] [place], [place] [address]  Troponin I (High Sensitivity)     Status: None   Collection Time: 10/25/23 10:37 PM  Result Value Ref Range   Troponin I  (High Sensitivity) 4 <18 ng/L    Comment: (NOTE) Elevated high sensitivity troponin I (hsTnI) values and significant  changes across serial measurements  may suggest ACS but many other  chronic and acute conditions are known to elevate hsTnI results.  Refer to the Links section for chest pain algorithms and additional  guidance. Performed at [facility], [address] [place], [place] [address]   Magnesium      Status: None   Collection Time: 10/25/23 10:37 PM  Result Value Ref Range   Magnesium  1.8 1.7 - 2.4 mg/dL    Comment: Performed at [facility], [address] [place], [place] [address]  TSH     Status: None   Collection Time: 10/25/23 10:37 PM  Result Value Ref Range   TSH 1.053 0.350 - 4.500 uIU/mL    Comment: Performed by a 3rd Generation assay with a functional sensitivity of <=0.01 uIU/mL. Performed at [facility], [address] [place], [place] [address]   D-dimer, quantitative     Status: Abnormal   Collection Time: 10/25/23 10:37 PM  Result Value Ref Range   D-Dimer, Quant 0.66 (H) 0.00 - 0.50 ug/mL-FEU    Comment: (NOTE) At the manufacturer cut-off value of 0.5 g/mL FEU, this assay has a negative predictive value of 95-100%.This assay is intended for use in conjunction with a clinical pretest probability (PTP) assessment model to exclude pulmonary embolism (PE) and deep venous thrombosis (DVT) in outpatients suspected of PE or DVT. Results should be correlated with clinical presentation. Performed at [facility], [address] [place], [place] [address]   Urinalysis, w/ Reflex to Culture (Infection Suspected) -Urine, Clean Catch     Status: Abnormal   Collection Time: 10/26/23 12:14 AM  Result Value Ref Range   Specimen Source URINE, CLEAN CATCH    Color, Urine YELLOW (A) YELLOW   APPearance CLEAR (A) CLEAR   Specific Gravity, Urine 1.018 1.005 - 1.030   pH 5.0 5.0 - 8.0   Glucose, UA 150 (A) NEGATIVE mg/dL    Hgb urine dipstick NEGATIVE NEGATIVE   Bilirubin Urine NEGATIVE NEGATIVE   Ketones, ur NEGATIVE NEGATIVE mg/dL   Protein, ur NEGATIVE NEGATIVE mg/dL   Nitrite NEGATIVE NEGATIVE   Leukocytes,Ua NEGATIVE NEGATIVE   RBC / HPF 0-5 0 - 5 RBC/hpf   WBC, UA 0-5 0 - 5 WBC/hpf    Comment:        Reflex urine culture not performed if WBC <=10, OR if Squamous epithelial cells >5. If Squamous epithelial cells >5 suggest recollection.    Bacteria, UA NONE SEEN NONE SEEN   Squamous Epithelial / HPF 0 0 - 5 /HPF   Mucus PRESENT    Hyaline Casts, UA PRESENT     Comment: Performed at [facility], [address] [place], [place] [address]  Troponin I (High Sensitivity)     Status: Abnormal   Collection Time: 10/26/23  2:56 AM  Result Value Ref Range   Troponin I (High Sensitivity) 28 (H) <18 ng/L    Comment: READ BACK AND VERIFIED WITH SAVANNAH CARICO RN @0351  10/26/23 ASW (NOTE) Elevated high sensitivity troponin I (hsTnI) values and significant  changes across serial measurements may suggest ACS but many other  chronic and acute conditions are known to elevate hsTnI results.  Refer to the Links section for chest pain algorithms and additional  guidance. Performed at [facility], [address] [place], [place] [address]   Protime-INR     Status: Abnormal   Collection Time: 10/26/23  2:56 AM  Result Value Ref Range   Prothrombin Time 16.6 (H) 11.4 -  15.2 seconds   INR 1.3 (H) 0.8 - 1.2    Comment: (NOTE) INR goal varies based on device and disease states. Performed at [facility], [address] [place], [place] [address]   Type and screen [facility]     Status: None   Collection Time: 10/26/23  2:56 AM  Result Value Ref Range   ABO/RH(D) A POS    Antibody Screen NEG    Sample Expiration      10/29/2023,2359 Performed at [facility], [address] [place], [place] [address]   Iron and TIBC     Status:  Abnormal   Collection Time: 10/26/23  2:56 AM  Result Value Ref Range   Iron 89 45 - 182 ug/dL   TIBC 757 (L) 749 - 549 ug/dL   Saturation Ratios 37 17.9 - 39.5 %   UIBC 153 ug/dL    Comment: Performed at [facility], [address] [place], [place] [address]  Ferritin     Status: None   Collection Time: 10/26/23  2:56 AM  Result Value Ref Range   Ferritin 236 24 - 336 ng/mL    Comment: Performed at [facility], [address] [place], [place] [address]  Reticulocytes     Status: Abnormal   Collection Time: 10/26/23  2:56 AM  Result Value Ref Range   Retic Ct Pct 2.4 0.4 - 3.1 %   RBC. 2.60 (L) 4.22 - 5.81 MIL/uL   Retic Count, Absolute 61.6 19.0 - 186.0 K/uL   Immature Retic Fract 12.5 2.3 - 15.9 %    Comment: Performed at [facility], [address] [place], [place] [address]  Basic metabolic panel     Status: Abnormal   Collection Time: 10/26/23  7:38 AM  Result Value Ref Range   Sodium 142 135 - 145 mmol/L   Potassium 4.2 3.5 - 5.1 mmol/L   Chloride 114 (H) 98 - 111 mmol/L   CO2 19 (L) 22 - 32 mmol/L   Glucose, Bld 135 (H) 70 - 99 mg/dL    Comment: Glucose reference range applies only to samples taken after fasting for at least 8 hours.   BUN 55 (H) 8 - 23 mg/dL   Creatinine, Ser 7.60 (H) 0.61 - 1.24 mg/dL   Calcium  8.3 (L) 8.9 - 10.3 mg/dL   GFR, Estimated 27 (L) >60 mL/min    Comment: (NOTE) Calculated using the CKD-EPI Creatinine Equation (2021)    Anion gap 9 5 - 15    Comment: Performed at [facility], [address] [place], [place] [address]  CBC     Status: Abnormal   Collection Time: 10/26/23  7:38 AM  Result Value Ref Range   WBC 4.2 4.0 - 10.5 K/uL   RBC 2.44 (L) 4.22 - 5.81 MIL/uL   Hemoglobin 8.0 (L) 13.0 - 17.0 g/dL   HCT 74.6 (L) 60.9 - 47.9 %   MCV 103.7 (H) 80.0 - 100.0 fL   MCH 32.8 26.0 - 34.0 pg   MCHC 31.6 30.0 - 36.0 g/dL   RDW 85.3 88.4 - 84.4 %   Platelets 131 (L) 150 - 400 K/uL   nRBC 0.0  0.0 - 0.2 %    Comment: Performed at [facility], [address], [place], [place] [address]  Troponin I (High Sensitivity)     Status: Abnormal   Collection Time: 10/26/23  7:38 AM  Result Value Ref Range   Troponin  I (High Sensitivity) 30 (H) <18 ng/L    Comment: (NOTE) Elevated high sensitivity troponin I (hsTnI) values and significant  changes across serial measurements may suggest ACS but many other  chronic and acute conditions are known to elevate hsTnI results.  Refer to the Links section for chest pain algorithms and additional  guidance. Performed at [facility], [address] [place], [place] [address]   ECHOCARDIOGRAM COMPLETE     Status: None   Collection Time: 10/26/23  2:40 PM  Result Value Ref Range   Weight 2,720 oz   Height 72 in   BP 99/64 mmHg   Ao pk vel 1.46 m/s   AR max vel 1.74 cm2   AV Peak grad 8.5 mmHg   S' Lateral 2.80 cm   Area-P 1/2 3.27 cm2   P 1/2 time 721 msec   Est EF 55 - 60%   Hemoglobin and hematocrit, blood     Status: Abnormal   Collection Time: 10/26/23  2:53 PM  Result Value Ref Range   Hemoglobin 7.8 (L) 13.0 - 17.0 g/dL   HCT 75.6 (L) 60.9 - 47.9 %    Comment: Performed at [facility], [address] [place], [place] [address]  Vitamin B12     Status: Abnormal   Collection Time: 10/26/23  5:45 PM  Result Value Ref Range   Vitamin B-12 1,769 (H) 180 - 914 pg/mL    Comment: (NOTE) This assay is not validated for testing neonatal or myeloproliferative syndrome specimens for Vitamin B12 levels. Performed at [facility], [address], [place], [place] [address]   Hemoglobin and hematocrit, blood     Status: Abnormal   Collection Time: 10/26/23  7:19 PM  Result Value Ref Range   Hemoglobin 8.1 (L) 13.0 - 17.0 g/dL   HCT 75.1 (L) 60.9 - 47.9 %    Comment: Performed at [facility], [address] [place], [place] [address]  CBC     Status: Abnormal   Collection Time:  10/27/23  3:32 AM  Result Value Ref Range   WBC 3.0 (L) 4.0 - 10.5 K/uL   RBC 2.32 (L) 4.22 - 5.81 MIL/uL   Hemoglobin 7.7 (L) 13.0 - 17.0 g/dL   HCT 76.3 (L) 60.9 - 47.9 %   MCV 101.7 (H) 80.0 - 100.0 fL   MCH 33.2 26.0 - 34.0 pg   MCHC 32.6 30.0 - 36.0 g/dL   RDW 85.2 88.4 - 84.4 %   Platelets 117 (L) 150 - 400 K/uL   nRBC 0.0 0.0 - 0.2 %    Comment: Performed at [facility], [address] [place], [place] [address]  Basic metabolic panel     Status: Abnormal   Collection Time: 11/07/23  2:29 PM  Result Value Ref Range   Sodium 139 135 - 145 mmol/L   Potassium 4.8 3.5 - 5.1 mmol/L   Chloride 108 98 - 111 mmol/L   CO2 22 22 - 32 mmol/L   Glucose, Bld 141 (H) 70 - 99 mg/dL    Comment: Glucose reference range applies only to samples taken after fasting for at least 8 hours.   BUN 53 (H) 8 - 23 mg/dL   Creatinine, Ser 7.76 (H) 0.61 - 1.24 mg/dL   Calcium  9.0 8.9 - 10.3 mg/dL   GFR, Estimated 30 (L) >60 mL/min    Comment: (NOTE) Calculated using the CKD-EPI Creatinine Equation (2021)    Anion gap 9 5 -  15    Comment: Performed at [facility], [address] [place], [place] [address]  CBC     Status: Abnormal   Collection Time: 11/07/23  2:29 PM  Result Value Ref Range   WBC 5.1 4.0 - 10.5 K/uL   RBC 2.62 (L) 4.22 - 5.81 MIL/uL   Hemoglobin 8.8 (L) 13.0 - 17.0 g/dL   HCT 72.5 (L) 60.9 - 47.9 %   MCV 104.6 (H) 80.0 - 100.0 fL   MCH 33.6 26.0 - 34.0 pg   MCHC 32.1 30.0 - 36.0 g/dL   RDW 86.0 88.4 - 84.4 %   Platelets 148 (L) 150 - 400 K/uL   nRBC 0.0 0.0 - 0.2 %    Comment: Performed at [facility], [address], [place], [place] [address]  Troponin I (High Sensitivity)     Status: None   Collection Time: 11/07/23  2:29 PM  Result Value Ref Range   Troponin I (High Sensitivity) 3 <18 ng/L    Comment: (NOTE) Elevated high sensitivity troponin I (hsTnI) values and significant  changes across serial measurements may suggest ACS but many  other  chronic and acute conditions are known to elevate hsTnI results.  Refer to the Links section for chest pain algorithms and additional  guidance. Performed at [facility], [address] [place], [place] [address]   Type and screen [facility]     Status: None   Collection Time: 11/07/23  2:29 PM  Result Value Ref Range   ABO/RH(D) A POS    Antibody Screen NEG    Sample Expiration      11/10/2023,2359 Performed at [facility], [address] [place], [place] [address]       Assessment & Plan:  Triamcinolone  cream  Problem List Items Addressed This Visit       Musculoskeletal and Integument   Contact dermatitis - Primary    Return in about 4 weeks (around 12/21/2023) for with Alan.   Total time spent: 25 minutes  Google, NP  11/23/2023   This document may have been prepared by Dragon Voice Recognition software and as such may include unintentional dictation errors.

## 2023-11-26 DIAGNOSIS — L259 Unspecified contact dermatitis, unspecified cause: Secondary | ICD-10-CM | POA: Insufficient documentation

## 2023-12-13 DIAGNOSIS — Z135 Encounter for screening for eye and ear disorders: Secondary | ICD-10-CM

## 2023-12-13 HISTORY — DX: Encounter for screening for eye and ear disorders: Z13.5

## 2023-12-25 ENCOUNTER — Ambulatory Visit: Admitting: Family

## 2023-12-25 ENCOUNTER — Other Ambulatory Visit: Payer: Self-pay

## 2023-12-25 VITALS — BP 118/60 | HR 78 | Ht 72.0 in | Wt 170.8 lb

## 2023-12-25 DIAGNOSIS — I10 Essential (primary) hypertension: Secondary | ICD-10-CM | POA: Diagnosis not present

## 2023-12-25 DIAGNOSIS — E1122 Type 2 diabetes mellitus with diabetic chronic kidney disease: Secondary | ICD-10-CM

## 2023-12-25 DIAGNOSIS — E559 Vitamin D deficiency, unspecified: Secondary | ICD-10-CM

## 2023-12-25 DIAGNOSIS — N184 Chronic kidney disease, stage 4 (severe): Secondary | ICD-10-CM | POA: Diagnosis not present

## 2023-12-25 DIAGNOSIS — E782 Mixed hyperlipidemia: Secondary | ICD-10-CM

## 2023-12-25 DIAGNOSIS — Z794 Long term (current) use of insulin: Secondary | ICD-10-CM

## 2023-12-25 DIAGNOSIS — R2689 Other abnormalities of gait and mobility: Secondary | ICD-10-CM | POA: Insufficient documentation

## 2023-12-25 DIAGNOSIS — E538 Deficiency of other specified B group vitamins: Secondary | ICD-10-CM

## 2023-12-25 DIAGNOSIS — K449 Diaphragmatic hernia without obstruction or gangrene: Secondary | ICD-10-CM

## 2023-12-25 DIAGNOSIS — K219 Gastro-esophageal reflux disease without esophagitis: Secondary | ICD-10-CM

## 2023-12-25 DIAGNOSIS — F411 Generalized anxiety disorder: Secondary | ICD-10-CM

## 2023-12-25 HISTORY — DX: Other abnormalities of gait and mobility: R26.89

## 2023-12-25 MED ORDER — CYANOCOBALAMIN 1000 MCG/ML IJ SOLN
1000.0000 ug | Freq: Once | INTRAMUSCULAR | Status: AC
  Administered 2023-12-25: 1000 ug via INTRAMUSCULAR

## 2023-12-25 NOTE — Assessment & Plan Note (Signed)
 Patient is seen by nephrology, who manage this condition.  He is well controlled with current therapy.   Will defer to them for further changes to plan of care.

## 2023-12-25 NOTE — Assessment & Plan Note (Signed)
 Blood pressure well controlled with current medications.  Continue current therapy.  Will reassess at follow up.

## 2023-12-25 NOTE — Assessment & Plan Note (Signed)
 Checking labs today.  Will continue supplements as needed.

## 2023-12-25 NOTE — Assessment & Plan Note (Signed)
 B12 injection given in office today.  Return for next injection per provider instructions.

## 2023-12-25 NOTE — Assessment & Plan Note (Signed)
 Continue current diabetes POC, as patient has been well controlled on current regimen.  Will adjust meds if needed based on labs.

## 2023-12-25 NOTE — Assessment & Plan Note (Signed)
 Checking labs today.  Continue current therapy for lipid control. Will modify as needed based on labwork results.

## 2023-12-25 NOTE — Assessment & Plan Note (Signed)
 Patient stable.  Well controlled with current therapy.   Continue current meds.

## 2023-12-25 NOTE — Progress Notes (Signed)
 Established Patient Office Visit  Subjective:  Patient ID: Xavier Hebert, male    DOB: [birth year]  Age: 78 y.o. MRN: [id number]  Chief Complaint  Patient presents with   Follow-up    4 week follow up    Patient is here today for his 3 months follow up.  He has been feeling fairly well since last appointment.   He does not have additional concerns to discuss today.  Labs are not due today.  He needs refills.   I have reviewed his active problem list, medication list, allergies, notes from last encounter, lab results for his appointment today.      No other concerns at this time.   Past Medical History:  Diagnosis Date   Age-related nuclear cataract of both eyes 01/21/2022   Anxiety    Arthritis    Chronic kidney disease, stage 3a (HCC) 08/16/2022   Aug 16, 2022 Entered By: RONNETTE MAXWELL A Comment: b/l Cr ~2-2.2   Chronic pain syndrome    CKD (chronic kidney disease) stage 3, GFR 30-59 ml/min (HCC)    Depression    Diabetes mellitus without complication (HCC)    Type II   DVT (deep venous thrombosis) (HCC)    Left lower leg   Encounter for screening for eye and ear disorders 12/13/2023   Episodic tension-type headache, not intractable 08/06/2022   Exposure to potentially hazardous substance 08/16/2022   Jul 05, 2022 Entered By: ROSANA DRAGON Comment: Entered automatically through [facility] Problem List documentation program   GERD (gastroesophageal reflux disease)    Headache    Migraine- headache   Hiatal hernia    History of urinary stone 08/06/2022   Hyperlipidemia    Hypertension    Kidney stones    Neck stiffness    limited up and down motion   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, unspecified laterality 11/27/2019   Other abnormalities of gait and mobility 12/25/2023   Peptic ulcer    Sleep apnea    does not use cpap/ pt states he lost 60#   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) 07/17/2019   Status post  laser cataract surgery of both eyes 05/10/2022   Unspecified glaucoma 08/06/2022   Wears hearing aid in both ears    Has, does not wear    Past Surgical History:  Procedure Laterality Date   ANTERIOR CERVICAL DECOMP/DISCECTOMY FUSION  2009   CARPAL TUNNEL RELEASE Left 01/03/2018   Procedure: CARPAL TUNNEL RELEASE;  Surgeon: Kathlynn Sharper, MD;  Location: [facility];  Service: Orthopedics;  Laterality: Left;   CARPAL TUNNEL RELEASE Right 07/17/2023   Procedure: CARPAL TUNNEL RELEASE;  Surgeon: Kathlynn Sharper, MD;  Location: [facility];  Service: Orthopedics;  Laterality: Right;   CARPOMETACARPAL (CMC) FUSION OF THUMB Left 08/10/2020   Procedure: CARPOMETACARPAL [facility]) FUSION OF THUMB;  Surgeon: Kathlynn Sharper, MD;  Location: [facility];  Service: Orthopedics;  Laterality: Left;   COLONOSCOPY WITH PROPOFOL  N/A 11/21/2017   Procedure: COLONOSCOPY WITH PROPOFOL ;  Surgeon: Toledo, Ladell POUR, MD;  Location: [facility];  Service: Gastroenterology;  Laterality: N/A;   CYSTOSCOPY     x3- 2 times for stone removal    ESOPHAGOGASTRODUODENOSCOPY N/A 10/14/2017   Procedure: ESOPHAGOGASTRODUODENOSCOPY (EGD);  Surgeon: Unk Corinn Skiff, MD;  Location: [facility];  Service: Gastroenterology;  Laterality: N/A;   ESOPHAGOGASTRODUODENOSCOPY (EGD) WITH PROPOFOL  N/A 10/25/2017   Procedure: ESOPHAGOGASTRODUODENOSCOPY (EGD) WITH PROPOFOL ;  Surgeon: Unk Corinn Skiff, MD;  Location: [facility];  Service: Gastroenterology;  Laterality: N/A;   JOINT REPLACEMENT     bil. knees , shoulder rt, and neck    REVERSE SHOULDER ARTHROPLASTY Right 12/01/2014   REVERSE SHOULDER ARTHROPLASTY Right 12/01/2014   Procedure: REVERSE SHOULDER ARTHROPLASTY;  Surgeon: Glendia Cordella Hutchinson, MD;  Location: [facility];  Service: Orthopedics;  Laterality: Right;   SHOULDER ARTHROSCOPY W/ ROTATOR CUFF REPAIR Right 06/10/2014   failed   SHOULDER OPEN ROTATOR CUFF REPAIR Left ~ 2008   TOTAL KNEE ARTHROPLASTY Bilateral 2005-2008    right-left   TRIGGER FINGER RELEASE Right 2001   TRIGGER FINGER RELEASE Left 08/10/2020   Procedure: RELEASE TRIGGER FINGER/A-1 PULLEY;  Surgeon: Kathlynn Sharper, MD;  Location: [facility];  Service: Orthopedics;  Laterality: Left;   ULNAR TUNNEL RELEASE Right 07/17/2023   Procedure: RELEASE, CUBITAL TUNNEL;  Surgeon: Kathlynn Sharper, MD;  Location: [facility];  Service: Orthopedics;  Laterality: Right;   URETERAL STENT PLACEMENT      Social History   Socioeconomic History   Marital status: Married    Spouse name: Not on file   Number of children: Not on file   Years of education: Not on file   Highest education level: Not on file  Occupational History   Not on file  Tobacco Use   Smoking status: Never   Smokeless tobacco: Never  Vaping Use   Vaping status: Never Used  Substance and Sexual Activity   Alcohol use: Never   Drug use: No   Sexual activity: Not Currently  Other Topics Concern   Not on file  Social History Narrative   Lives at home with his wife. Independent at baseline.   Social Drivers of Corporate investment banker Strain: Low Risk  (05/31/2023)   Received from [facility]   Overall Financial Resource Strain (CARDIA)    Difficulty of Paying Living Expenses: Not hard at all  Food Insecurity: No Food Insecurity (05/31/2023)   Received from [facility]   Hunger Vital Sign    Within the past 12 months, you worried that your food would run out before you got the money to buy more.: Never true    Within the past 12 months, the food you bought just didn't last and you didn't have money to get more.: Never true  Transportation Needs: No Transportation Needs (05/31/2023)   [facility] from [facility] - Transportation    In the past 12 months, has lack of transportation kept you from medical appointments or from getting medications?: No    Lack of Transportation (Non-Medical): No  Physical Activity: Not  on file  Stress: Not on file  Social Connections: Not on file  Intimate Partner Violence: Not on file    Family History  Problem Relation Age of Onset   Dementia Mother    Alcohol abuse Father     Allergies  Allergen Reactions   Ivp Dye [Iodinated Contrast Media] Anaphylaxis and Other (See Comments)    Breathing difficulty and chest pain   Quinine Shortness Of Breath, Rash and Other (See Comments)    Other reaction(s): SHORTNESS OF BREATH   Amlodipine  Swelling    Other reaction(s): Edema of lower extremity   Aspirin Other (See Comments)    Stomach pain   Gabapentin     Other reaction(s): Dizziness   Glipizide Other (See Comments)    Other reaction(s): Chest pain   Hydrochlorothiazide Other (See Comments)    Other reaction(s): Impotence  Ibuprofen Other (See Comments)    Stomach pain  Other Reaction(s): ITCHING,WATERING EYES   Ioxaglate Other (See Comments)    Breathing difficulty and chest pain   Latex Itching    Gloves   Metformin And Related Diarrhea   Nsaids     Kidneys   Omeprazole  Other (See Comments)    Other reaction(s): FAILED THERAPY   Pregabalin Other (See Comments)    Other reaction(s): Dizziness   Doxycycline  Anxiety   Pioglitazone Diarrhea and Other (See Comments)    Other reaction(s): Diarrhea, Abdominal pain   Tape Rash    Some bandages and wraps have started causing rashes.  Unsure if it is the adhesive or the bandage material    Review of Systems  All other systems reviewed and are negative.      Objective:   BP 118/60   Pulse 78   Ht 6' (1.829 m)   Wt 170 lb 12.8 oz (77.5 kg)   SpO2 97%   BMI 23.16 kg/m   Vitals:   12/25/23 1410  BP: 118/60  Pulse: 78  Height: 6' (1.829 m)  Weight: 170 lb 12.8 oz (77.5 kg)  SpO2: 97%  BMI (Calculated): 23.16    Physical Exam Vitals and nursing note reviewed.  Constitutional:      Appearance: Normal appearance. He is normal weight.  Eyes:     Pupils: Pupils are equal, round, and  reactive to light.  Cardiovascular:     Rate and Rhythm: Normal rate and regular rhythm.     Pulses: Normal pulses.     Heart sounds: Normal heart sounds.  Pulmonary:     Effort: Pulmonary effort is normal.     Breath sounds: Normal breath sounds.  Neurological:     General: No focal deficit present.     Mental Status: He is alert and oriented to person, place, and time. Mental status is at baseline.  Psychiatric:        Mood and Affect: Mood normal.        Behavior: Behavior normal.        Thought Content: Thought content normal.        Judgment: Judgment normal.      No results found for any visits on 12/25/23.  Recent Results (from the past 2160 hours)  CBC with Differential/Platelet     Status: Abnormal   Collection Time: 10/25/23 10:37 PM  Result Value Ref Range   WBC 3.5 (L) 4.0 - 10.5 K/uL   RBC 2.55 (L) 4.22 - 5.81 MIL/uL   Hemoglobin 8.5 (L) 13.0 - 17.0 g/dL   HCT 73.7 (L) 60.9 - 47.9 %   MCV 102.7 (H) 80.0 - 100.0 fL   MCH 33.3 26.0 - 34.0 pg   MCHC 32.4 30.0 - 36.0 g/dL   RDW 85.0 88.4 - 84.4 %   Platelets 132 (L) 150 - 400 K/uL   nRBC 0.0 0.0 - 0.2 %   Neutrophils Relative % 70 %   Neutro Abs 2.5 1.7 - 7.7 K/uL   Lymphocytes Relative 16 %   Lymphs Abs 0.6 (L) 0.7 - 4.0 K/uL   Monocytes Relative 9 %   Monocytes Absolute 0.3 0.1 - 1.0 K/uL   Eosinophils Relative 4 %   Eosinophils Absolute 0.1 0.0 - 0.5 K/uL   Basophils Relative 1 %   Basophils Absolute 0.0 0.0 - 0.1 K/uL   Immature Granulocytes 0 %   Abs Immature Granulocytes 0.01 0.00 - 0.07 K/uL    Comment:  Performed at [facility], [address] [place], [place] [address]  Comprehensive metabolic panel     Status: Abnormal   Collection Time: 10/25/23 10:37 PM  Result Value Ref Range   Sodium 140 135 - 145 mmol/L   Potassium 4.4 3.5 - 5.1 mmol/L   Chloride 113 (H) 98 - 111 mmol/L   CO2 20 (L) 22 - 32 mmol/L   Glucose, Bld 160 (H) 70 - 99 mg/dL    Comment: Glucose reference range  applies only to samples taken after fasting for at least 8 hours.   BUN 59 (H) 8 - 23 mg/dL   Creatinine, Ser 7.35 (H) 0.61 - 1.24 mg/dL   Calcium  8.6 (L) 8.9 - 10.3 mg/dL   Total Protein 6.4 (L) 6.5 - 8.1 g/dL   Albumin  3.7 3.5 - 5.0 g/dL   AST 17 15 - 41 U/L   ALT 19 0 - 44 U/L   Alkaline Phosphatase 83 38 - 126 U/L   Total Bilirubin 0.8 0.0 - 1.2 mg/dL   GFR, Estimated 24 (L) >60 mL/min    Comment: (NOTE) Calculated using the CKD-EPI Creatinine Equation (2021)    Anion gap 7 5 - 15    Comment: Performed at [facility], [address], [place], [place] [address]  Troponin I (High Sensitivity)     Status: None   Collection Time: 10/25/23 10:37 PM  Result Value Ref Range   Troponin I (High Sensitivity) 4 <18 ng/L    Comment: (NOTE) Elevated high sensitivity troponin I (hsTnI) values and significant  changes across serial measurements may suggest ACS but many other  chronic and acute conditions are known to elevate hsTnI results.  Refer to the Links section for chest pain algorithms and additional  guidance. Performed at [facility], [address] [place], [place] [address]   Magnesium      Status: None   Collection Time: 10/25/23 10:37 PM  Result Value Ref Range   Magnesium  1.8 1.7 - 2.4 mg/dL    Comment: Performed at [facility], [address] [place], [place] [address]  TSH     Status: None   Collection Time: 10/25/23 10:37 PM  Result Value Ref Range   TSH 1.053 0.350 - 4.500 uIU/mL    Comment: Performed by a 3rd Generation assay with a functional sensitivity of <=0.01 uIU/mL. Performed at [facility], [address] [place], [place] [address]   D-dimer, quantitative     Status: Abnormal   Collection Time: 10/25/23 10:37 PM  Result Value Ref Range   D-Dimer, Quant 0.66 (H) 0.00 - 0.50 ug/mL-FEU    Comment: (NOTE) At the manufacturer cut-off value of 0.5 g/mL FEU, this assay has a negative predictive value of  95-100%.This assay is intended for use in conjunction with a clinical pretest probability (PTP) assessment model to exclude pulmonary embolism (PE) and deep venous thrombosis (DVT) in outpatients suspected of PE or DVT. Results should be correlated with clinical presentation. Performed at [facility], [address] [place], [place] [address]   Urinalysis, w/ Reflex to Culture (Infection Suspected) -Urine, Clean Catch     Status: Abnormal   Collection Time: 10/26/23 12:14 AM  Result Value Ref Range   Specimen Source URINE, CLEAN CATCH    Color, Urine YELLOW (A) YELLOW   APPearance CLEAR (A) CLEAR   Specific Gravity, Urine 1.018 1.005 - 1.030   pH 5.0 5.0 - 8.0   Glucose, UA 150 (A) NEGATIVE  mg/dL   Hgb urine dipstick NEGATIVE NEGATIVE   Bilirubin Urine NEGATIVE NEGATIVE   Ketones, ur NEGATIVE NEGATIVE mg/dL   Protein, ur NEGATIVE NEGATIVE mg/dL   Nitrite NEGATIVE NEGATIVE   Leukocytes,Ua NEGATIVE NEGATIVE   RBC / HPF 0-5 0 - 5 RBC/hpf   WBC, UA 0-5 0 - 5 WBC/hpf    Comment:        Reflex urine culture not performed if WBC <=10, OR if Squamous epithelial cells >5. If Squamous epithelial cells >5 suggest recollection.    Bacteria, UA NONE SEEN NONE SEEN   Squamous Epithelial / HPF 0 0 - 5 /HPF   Mucus PRESENT    Hyaline Casts, UA PRESENT     Comment: Performed at [facility], [address] [place], [place] [address]  Troponin I (High Sensitivity)     Status: Abnormal   Collection Time: 10/26/23  2:56 AM  Result Value Ref Range   Troponin I (High Sensitivity) 28 (H) <18 ng/L    Comment: READ BACK AND VERIFIED WITH SAVANNAH CARICO RN @0351  10/26/23 ASW (NOTE) Elevated high sensitivity troponin I (hsTnI) values and significant  changes across serial measurements may suggest ACS but many other  chronic and acute conditions are known to elevate hsTnI results.  Refer to the Links section for chest pain algorithms and additional  guidance. Performed at  [facility], [address] [place], [place] [address]   Protime-INR     Status: Abnormal   Collection Time: 10/26/23  2:56 AM  Result Value Ref Range   Prothrombin Time 16.6 (H) 11.4 - 15.2 seconds   INR 1.3 (H) 0.8 - 1.2    Comment: (NOTE) INR goal varies based on device and disease states. Performed at [facility], [address] [place], [place] [address]   Type and screen [facility]     Status: None   Collection Time: 10/26/23  2:56 AM  Result Value Ref Range   ABO/RH(D) A POS    Antibody Screen NEG    Sample Expiration      10/29/2023,2359 Performed at [facility], [address] [place], [place] [address]   Iron and TIBC     Status: Abnormal   Collection Time: 10/26/23  2:56 AM  Result Value Ref Range   Iron 89 45 - 182 ug/dL   TIBC 757 (L) 749 - 549 ug/dL   Saturation Ratios 37 17.9 - 39.5 %   UIBC 153 ug/dL    Comment: Performed at [facility], [address], [place], [place] [address]  Ferritin     Status: None   Collection Time: 10/26/23  2:56 AM  Result Value Ref Range   Ferritin 236 24 - 336 ng/mL    Comment: Performed at [facility], [address] [place], [place] [address]  Reticulocytes     Status: Abnormal   Collection Time: 10/26/23  2:56 AM  Result Value Ref Range   Retic Ct Pct 2.4 0.4 - 3.1 %   RBC. 2.60 (L) 4.22 - 5.81 MIL/uL   Retic Count, Absolute 61.6 19.0 - 186.0 K/uL   Immature Retic Fract 12.5 2.3 - 15.9 %    Comment: Performed at [facility], [address], [place], [place] [address]  Basic metabolic panel     Status: Abnormal   Collection Time: 10/26/23  7:38 AM  Result Value Ref Range   Sodium 142 135 - 145 mmol/L   Potassium 4.2  3.5 - 5.1 mmol/L   Chloride 114 (H) 98 - 111 mmol/L   CO2 19 (L) 22 - 32 mmol/L   Glucose, Bld 135 (H) 70 - 99 mg/dL    Comment: Glucose reference range applies only to samples taken after fasting for at least 8  hours.   BUN 55 (H) 8 - 23 mg/dL   Creatinine, Ser 7.60 (H) 0.61 - 1.24 mg/dL   Calcium  8.3 (L) 8.9 - 10.3 mg/dL   GFR, Estimated 27 (L) >60 mL/min    Comment: (NOTE) Calculated using the CKD-EPI Creatinine Equation (2021)    Anion gap 9 5 - 15    Comment: Performed at [facility], [address] [place], [place] [address]  CBC     Status: Abnormal   Collection Time: 10/26/23  7:38 AM  Result Value Ref Range   WBC 4.2 4.0 - 10.5 K/uL   RBC 2.44 (L) 4.22 - 5.81 MIL/uL   Hemoglobin 8.0 (L) 13.0 - 17.0 g/dL   HCT 74.6 (L) 60.9 - 47.9 %   MCV 103.7 (H) 80.0 - 100.0 fL   MCH 32.8 26.0 - 34.0 pg   MCHC 31.6 30.0 - 36.0 g/dL   RDW 85.3 88.4 - 84.4 %   Platelets 131 (L) 150 - 400 K/uL   nRBC 0.0 0.0 - 0.2 %    Comment: Performed at [facility], [address] [place], [place] [address]  Troponin I (High Sensitivity)     Status: Abnormal   Collection Time: 10/26/23  7:38 AM  Result Value Ref Range   Troponin I (High Sensitivity) 30 (H) <18 ng/L    Comment: (NOTE) Elevated high sensitivity troponin I (hsTnI) values and significant  changes across serial measurements may suggest ACS but many other  chronic and acute conditions are known to elevate hsTnI results.  Refer to the Links section for chest pain algorithms and additional  guidance. Performed at [facility], [address] [place], [place] [address]   ECHOCARDIOGRAM COMPLETE     Status: None   Collection Time: 10/26/23  2:40 PM  Result Value Ref Range   Weight 2,720 oz   Height 72 in   BP 99/64 mmHg   Ao pk vel 1.46 m/s   AR max vel 1.74 cm2   AV Peak grad 8.5 mmHg   S' Lateral 2.80 cm   Area-P 1/2 3.27 cm2   P 1/2 time 721 msec   Est EF 55 - 60%   Hemoglobin and hematocrit, blood     Status: Abnormal   Collection Time: 10/26/23  2:53 PM  Result Value Ref Range   Hemoglobin 7.8 (L) 13.0 - 17.0 g/dL   HCT 75.6 (L) 60.9 - 47.9 %    Comment: Performed at [facility],  [address] [place], [place] [address]  Vitamin B12     Status: Abnormal   Collection Time: 10/26/23  5:45 PM  Result Value Ref Range   Vitamin B-12 1,769 (H) 180 - 914 pg/mL    Comment: (NOTE) This assay is not validated for testing neonatal or myeloproliferative syndrome specimens for Vitamin B12 levels. Performed at [facility], [address], [place], [place] [address]   Hemoglobin and hematocrit, blood     Status: Abnormal   Collection Time: 10/26/23  7:19 PM  Result Value Ref Range   Hemoglobin 8.1 (L) 13.0 - 17.0 g/dL   HCT 75.1 (L) 60.9 - 47.9 %  Comment: Performed at [facility], [address] [place], [place] [address]  CBC     Status: Abnormal   Collection Time: 10/27/23  3:32 AM  Result Value Ref Range   WBC 3.0 (L) 4.0 - 10.5 K/uL   RBC 2.32 (L) 4.22 - 5.81 MIL/uL   Hemoglobin 7.7 (L) 13.0 - 17.0 g/dL   HCT 76.3 (L) 60.9 - 47.9 %   MCV 101.7 (H) 80.0 - 100.0 fL   MCH 33.2 26.0 - 34.0 pg   MCHC 32.6 30.0 - 36.0 g/dL   RDW 85.2 88.4 - 84.4 %   Platelets 117 (L) 150 - 400 K/uL   nRBC 0.0 0.0 - 0.2 %    Comment: Performed at [facility], [address] [place], [place] [address]  Basic metabolic panel     Status: Abnormal   Collection Time: 11/07/23  2:29 PM  Result Value Ref Range   Sodium 139 135 - 145 mmol/L   Potassium 4.8 3.5 - 5.1 mmol/L   Chloride 108 98 - 111 mmol/L   CO2 22 22 - 32 mmol/L   Glucose, Bld 141 (H) 70 - 99 mg/dL    Comment: Glucose reference range applies only to samples taken after fasting for at least 8 hours.   BUN 53 (H) 8 - 23 mg/dL   Creatinine, Ser 7.76 (H) 0.61 - 1.24 mg/dL   Calcium  9.0 8.9 - 10.3 mg/dL   GFR, Estimated 30 (L) >60 mL/min    Comment: (NOTE) Calculated using the CKD-EPI Creatinine Equation (2021)    Anion gap 9 5 - 15    Comment: Performed at [facility], [address] [place], [place] [address]  CBC     Status: Abnormal   Collection Time: 11/07/23  2:29 PM   Result Value Ref Range   WBC 5.1 4.0 - 10.5 K/uL   RBC 2.62 (L) 4.22 - 5.81 MIL/uL   Hemoglobin 8.8 (L) 13.0 - 17.0 g/dL   HCT 72.5 (L) 60.9 - 47.9 %   MCV 104.6 (H) 80.0 - 100.0 fL   MCH 33.6 26.0 - 34.0 pg   MCHC 32.1 30.0 - 36.0 g/dL   RDW 86.0 88.4 - 84.4 %   Platelets 148 (L) 150 - 400 K/uL   nRBC 0.0 0.0 - 0.2 %    Comment: Performed at [facility], [address], [place], [place] [address]  Troponin I (High Sensitivity)     Status: None   Collection Time: 11/07/23  2:29 PM  Result Value Ref Range   Troponin I (High Sensitivity) 3 <18 ng/L    Comment: (NOTE) Elevated high sensitivity troponin I (hsTnI) values and significant  changes across serial measurements may suggest ACS but many other  chronic and acute conditions are known to elevate hsTnI results.  Refer to the Links section for chest pain algorithms and additional  guidance. Performed at [facility], [address] [place], [place] [address]   Type and screen [facility]     Status: None   Collection Time: 11/07/23  2:29 PM  Result Value Ref Range   ABO/RH(D) A POS    Antibody Screen NEG    Sample Expiration      11/10/2023,2359 Performed at [facility], [address] [place], [place] [address]        Assessment & Plan B12 deficiency due to diet B12 injection given in office today.  Return for next injection per provider  instructions.  Chronic kidney disease, stage 4 (severe) (HCC) Patient is seen by nephrology, who manage this condition.  He is well controlled with current therapy.   Will defer to them for further changes to plan of care.  Vitamin D  deficiency, unspecified Checking labs today.  Will continue supplements as needed.   Type 2 diabetes mellitus with stage 4 chronic kidney disease, with long-term current use of insulin  (HCC) Continue current diabetes POC, as patient has been well controlled on current regimen.  Will adjust  meds if needed based on labs.   Essential (primary) hypertension Blood pressure well controlled with current medications.  Continue current therapy.  Will reassess at follow up.   Gastroesophageal reflux disease with hiatal hernia Patient stable.  Well controlled with current therapy.   Continue current meds.   Generalized anxiety disorder Patient stable.  Well controlled with current therapy.   Continue current meds.   Hyperlipidemia, mixed Checking labs today.  Continue current therapy for lipid control. Will modify as needed based on labwork results.      Return in about 3 months (around 03/26/2024).   Total time spent: 20 minutes  ALAN CHRISTELLA ARRANT, FNP  12/25/2023   This document may have been prepared by [facility] Voice Recognition software and as such may include unintentional dictation errors.

## 2023-12-26 ENCOUNTER — Ambulatory Visit: Admitting: Family Medicine

## 2023-12-26 NOTE — Progress Notes (Signed)
 12/27/2023 KHRIZ LIDDY [id number] 04/10/1945  Gastroenterology Office Note    Referring Provider: Orlean Alan HERO, FNP Primary Care Physician:  Orlean Alan HERO, FNP  Primary GI Provider: Jinny Carmine, MD    Chief Complaint   Chief Complaint  Patient presents with   New Patient (Initial Visit)    GI bleed- anemia-currently has diarrhea 6 weeks-     History of Present Illness   Xavier Hebert is a 78 y.o. male with PMHX of hypertension, hyperlipidemia, anxiety/depression, type 2 diabetes, and CKD, presenting today for anemia and diarrhea.   Patient was seen 10/27/2023 and emergency room, HGB dropped from 14 to 8 in 6 months. No inpatient workup at that time.  Patient reports for the last 6 months he has been more fatigued, he does get B12 injections.   Patient reports history of constipation due to taking oxycodone , but this was controlled and he was having formed bowel movement daily. For the last 6 weeks he has been having diarrhea, loose stools 3 times a day.  He has gas pains and cramping with the diarrhea, occasional nausea.  Patient reports he was on antibiotic maybe 6 weeks ago, unsure of why he was taking this.  No unintentional weight loss, he is on Ozempic and this curbs his appetite. Denies melena, hematochezia, fevers, chills.   Patient reports he has a colonoscopy scheduled with [facility] 01/22/2024.  Reports last EGD was in 2022, do not have these records.   11/21/2017 colonoscopy - Diverticulosis in the sigmoid colon.  - Four 2 to 4 mm polyps in the rectum: HYPERPLASTIC POLYPS, 2 FRAGMENTS.  - NEGATIVE FOR DYSPLASIA AND MALIGNANCY.  - in the transverse colon and in the ascending colon, - TUBULAR ADENOMA, 2 FRAGMENTS. NEGATIVE FOR HIGH-GRADE DYSPLASIA AND MALIGNANCY.  - Non-bleeding internal hemorrhoids.  - The examination was otherwise normal.  10/25/2017 upper endoscopy - Normal duodenal bulb and second portion of the duodenum. - Erythematous mucosa in  the gastric fundus and gastric body. Biopsied.  negative for H. pylori intestinal metaplasia dysplasia and malignancy - Normal incisura, antrum and pylorus. Biopsied.  - Normal gastroesophageal junction and esophagus. Biopsied. NEGATIVE FOR GOBLET CELLS, DYSPLASIA, AND MALIGNANCY.  10/14/2017 upper endoscopy Impression: - Benign-appearing esophageal stenosis. Dilated.  - Normal stomach.  - Normal duodenal bulb and second portion of the duodenum.  - No specimens collected.  Past Medical History:  Diagnosis Date   Age-related nuclear cataract of both eyes 01/21/2022   Anxiety    Arthritis    Chronic kidney disease, stage 3a (HCC) 08/16/2022   Aug 16, 2022 Entered By: RONNETTE MAXWELL A Comment: b/l Cr ~2-2.2   Chronic pain syndrome    CKD (chronic kidney disease) stage 3, GFR 30-59 ml/min (HCC)    Depression    Diabetes mellitus without complication (HCC)    Type II   DVT (deep venous thrombosis) (HCC)    Left lower leg   Encounter for screening for eye and ear disorders 12/13/2023   Episodic tension-type headache, not intractable 08/06/2022   Exposure to potentially hazardous substance 08/16/2022   Jul 05, 2022 Entered By: ROSANA DRAGON Comment: Entered automatically through [facility] Problem List documentation program   GERD (gastroesophageal reflux disease)    Headache    Migraine- headache   Hiatal hernia    History of urinary stone 08/06/2022   Hyperlipidemia    Hypertension    Kidney stones    Neck stiffness    limited up and down motion  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, unspecified laterality 11/27/2019   Other abnormalities of gait and mobility 12/25/2023   Peptic ulcer    Sleep apnea    does not use cpap/ pt states he lost 60#   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) 07/17/2019   Status post laser cataract surgery of both eyes 05/10/2022   Unspecified glaucoma 08/06/2022   Wears hearing aid in both ears    Has, does  not wear    Past Surgical History:  Procedure Laterality Date   ANTERIOR CERVICAL DECOMP/DISCECTOMY FUSION  2009   CARPAL TUNNEL RELEASE Left 01/03/2018   Procedure: CARPAL TUNNEL RELEASE;  Surgeon: Kathlynn Sharper, MD;  Location: [facility] ORS;  Service: Orthopedics;  Laterality: Left;   CARPAL TUNNEL RELEASE Right 07/17/2023   Procedure: CARPAL TUNNEL RELEASE;  Surgeon: Kathlynn Sharper, MD;  Location: [facility];  Service: Orthopedics;  Laterality: Right;   CARPOMETACARPAL (CMC) FUSION OF THUMB Left 08/10/2020   Procedure: CARPOMETACARPAL [facility]) FUSION OF THUMB;  Surgeon: Kathlynn Sharper, MD;  Location: [facility] ORS;  Service: Orthopedics;  Laterality: Left;   COLONOSCOPY WITH PROPOFOL  N/A 11/21/2017   Procedure: COLONOSCOPY WITH PROPOFOL ;  Surgeon: Toledo, Ladell POUR, MD;  Location: [facility] END[facility]COPY;  Service: Gastroenterology;  Laterality: N/A;   CYSTOSCOPY     x3- 2 times for stone removal    ESOPHAGOGASTRODUODENOSCOPY N/A 10/14/2017   Procedure: ESOPHAGOGASTRODUODENOSCOPY (EGD);  Surgeon: Unk Corinn Skiff, MD;  Location: [facility] END[facility];  Service: Gastroenterology;  Laterality: N/A;   ESOPHAGOGASTRODUODENOSCOPY (EGD) WITH PROPOFOL  N/A 10/25/2017   Procedure: ESOPHAGOGASTRODUODENOSCOPY (EGD) WITH PROPOFOL ;  Surgeon: Unk Corinn Skiff, MD;  Location: [facility] END[facility]COPY;  Service: Gastroenterology;  Laterality: N/A;   JOINT REPLACEMENT     bil. knees , shoulder rt, and neck    REVERSE SHOULDER ARTHROPLASTY Right 12/01/2014   REVERSE SHOULDER ARTHROPLASTY Right 12/01/2014   Procedure: REVERSE SHOULDER ARTHROPLASTY;  Surgeon: Glendia Cordella Hutchinson, MD;  Location: [facility] OR;  Service: Orthopedics;  Laterality: Right;   SHOULDER ARTHROSCOPY W/ ROTATOR CUFF REPAIR Right 06/10/2014   failed   SHOULDER OPEN ROTATOR CUFF REPAIR Left ~ 2008   TOTAL KNEE ARTHROPLASTY Bilateral 2005-2008   right-left   TRIGGER FINGER RELEASE Right 2001   TRIGGER FINGER RELEASE Left 08/10/2020   Procedure: RELEASE TRIGGER FINGER/A-1  PULLEY;  Surgeon: Kathlynn Sharper, MD;  Location: [facility] OR[facility];  Service: Orthopedics;  Laterality: Left;   ULNAR TUNNEL RELEASE Right 07/17/2023   Procedure: RELEASE, CUBITAL TUNNEL;  Surgeon: Kathlynn Sharper, MD;  Location: [facility];  Service: Orthopedics;  Laterality: Right;   URETERAL STENT PLACEMENT      Current Outpatient Medications  Medication Sig Dispense Refill   acetaminophen  (TYLENOL ) 325 MG tablet Take 2 tablets (650 mg total) by mouth every 6 (six) hours as needed.     allopurinol  (ZYLOPRIM ) 100 MG tablet Take 100 mg by mouth daily.      butalbital -acetaminophen -caffeine  (FIORICET , ESGIC ) 50-325-40 MG per tablet Take 1-2 tablets by mouth See admin instructions. Take 2 tablets by mouth at start of headache and take 1 additional tablet by mouth after 2 hours if needed - max 3 tablets daily     cholecalciferol  (VITAMIN D ) 1000 units tablet Take 5,000 Units by mouth daily.     clobetasol cream (TEMOVATE) 0.05 % Apply 1 application topically daily as needed (eczema). Apply to affected area(s) 2 to 3 times weekly as needed     colchicine  0.6 MG tablet TAKE 1 TABLET  BY MOUTH DAILY (Patient taking differently: Take 0.6 mg by mouth daily as needed.) 30 tablet 1   empagliflozin  (JARDIANCE ) 25 MG TABS tablet Take 12.5 mg by mouth daily.     flunisolide (NASALIDE) 25 MCG/ACT (0.025%) SOLN PLACE 1 SPRAY INTO EACH NOSTRIL TWICE DAILY 25 mL 4   hydrOXYzine  (ATARAX ) 50 MG tablet TAKE 1 TABLET BY MOUTH EVERY 4 TO 6 HOURS AS NEEDED 360 tablet 1   ketoconazole (NIZORAL) 2 % shampoo Apply 1 application topically once a week.  (Patient not taking: Reported on 11/07/2023)     L-Tryptophan  500 MG CAPS Take 500 mg by mouth at bedtime. (Patient not taking: Reported on 11/07/2023)     latanoprost  (XALATAN ) 0.005 % ophthalmic solution Place 1 drop into both eyes at bedtime.     lidocaine  (LIDODERM ) 5 % Place 1 patch onto the skin every 12 (twelve) hours. Remove & Discard patch within 12 hours or as directed  by MD 60 patch 6   [Paused] lisinopril  (ZESTRIL ) 20 MG tablet Take 20 mg by mouth daily.     magnesium  oxide (MAG-OX) 400 MG tablet Take 400 mg by mouth daily.     methocarbamol  (ROBAXIN ) 750 MG tablet TAKE 1 TABLET BY MOUTH 4 TIMES DAILY 360 tablet 1   Misc Natural Products (OSTEO BI-FLEX ADV TRIPLE ST) TABS Take 1 tablet by mouth in the morning and at bedtime.     Multiple Vitamin (MULTIVITAMIN) tablet Take 1 tablet by mouth daily.     naloxone  (NARCAN ) nasal spray 4 mg/0.1 mL Place 1 spray into the nose.     oxyCODONE  (ROXICODONE ) 15 MG immediate release tablet Take 15 mg by mouth 5 (five) times daily. Takes 5 times daily (per MD) (1 tablet at 0615, 1 tablet at 1130, 1 tablet at 1730, 1 tablet at 2100 and 1 tablet @ 2300)     oxyCODONE  (ROXICODONE ) 5 MG immediate release tablet Take 1-2 tablets (5-10 mg total) by mouth every 6 (six) hours as needed for moderate pain (pain score 4-6). 30 tablet 0   pantoprazole  (PROTONIX ) 40 MG tablet Take 40 mg by mouth 2 (two) times daily.      rosuvastatin  (CRESTOR ) 20 MG tablet Take 10 mg by mouth at bedtime.      Saw Palmetto  450 MG CAPS Take 900 mg by mouth in the morning and at bedtime.     Semaglutide, 1 MG/DOSE, 4 MG/3ML SOPN Inject 1 mg into the skin once a week.     tamsulosin  (FLOMAX ) 0.4 MG CAPS capsule Take 0.4 mg by mouth daily.     timolol  (TIMOPTIC ) 0.5 % ophthalmic solution Place 1 drop into the left eye 2 (two) times daily.     triamcinolone  ointment (KENALOG ) 0.5 % Apply 1 Application topically 2 (two) times daily. 30 g 0   No current facility-administered medications for this visit.    Allergies as of 12/27/2023 - Review Complete 12/27/2023  Allergen Reaction Noted   Ivp dye [iodinated contrast media] Anaphylaxis and Other (See Comments) 11/06/2013   Quinine Shortness Of Breath, Rash, and Other (See Comments) 05/06/2002   Amlodipine  Swelling 10/12/2014   Aspirin Other (See Comments) 05/07/2003   Gabapentin  09/17/2014   Glipizide  Other (See Comments) 12/02/2009   Hydrochlorothiazide Other (See Comments) 10/09/2014   Ibuprofen Other (See Comments) 05/07/2003   Ioxaglate Other (See Comments) 01/27/2015   Latex Itching 11/20/2014   Metformin and related Diarrhea 07/09/2023   Nsaids  08/02/2020   Omeprazole  Other (See Comments)  12/14/2003   Pregabalin Other (See Comments) 09/17/2014   Doxycycline  Anxiety 07/16/2019   Pioglitazone Diarrhea and Other (See Comments) 07/03/2010   Tape Rash 07/09/2023    Family History  Problem Relation Age of Onset   Dementia Mother    Alcohol abuse Father     Social History   Socioeconomic History   Marital status: Married    Spouse name: Not on file   Number of children: Not on file   Years of education: Not on file   Highest education level: Not on file  Occupational History   Not on file  Tobacco Use   Smoking status: Never   Smokeless tobacco: Never  Vaping Use   Vaping status: Never Used  Substance and Sexual Activity   Alcohol use: Never   Drug use: No   Sexual activity: Not Currently  Other Topics Concern   Not on file  Social History Narrative   Lives at home with his wife. Independent at baseline.   Social Drivers of Corporate investment banker Strain: Low Risk  (05/31/2023)   Received from [facility]   Overall Financial Resource Strain (CARDIA)    Difficulty of Paying Living Expenses: Not hard at all  Food Insecurity: No Food Insecurity (05/31/2023)   Received from [facility]   Hunger Vital Sign    Within the past 12 months, you worried that your food would run out before you got the money to buy more.: Never true    Within the past 12 months, the food you bought just didn't last and you didn't have money to get more.: Never true  Transportation Needs: No Transportation Needs (05/31/2023)   Received from [facility]    In the past 12 months, has lack of transportation  kept you from medical appointments or from getting medications?: No    Lack of Transportation (Non-Medical): No  Physical Activity: Not on file  Stress: Not on file  Social Connections: Not on file  Intimate Partner Violence: Not on file     RELEVANT GI HISTORY, IMAGING AND LABS: CBC    Component Value Date/Time   WBC 5.1 11/07/2023 1429   RBC 2.62 (L) 11/07/2023 1429   HGB 8.8 (L) 11/07/2023 1429   HGB 14.3 04/03/2023 1406   HCT 27.4 (L) 11/07/2023 1429   HCT 43.9 04/03/2023 1406   PLT 148 (L) 11/07/2023 1429   PLT 146 (L) 04/03/2023 1406   MCV 104.6 (H) 11/07/2023 1429   MCV 97 04/03/2023 1406   MCH 33.6 11/07/2023 1429   MCHC 32.1 11/07/2023 1429   RDW 13.9 11/07/2023 1429   RDW 12.9 04/03/2023 1406   LYMPHSABS 0.6 (L) 10/25/2023 2237   LYMPHSABS 0.9 04/03/2023 1406   MONOABS 0.3 10/25/2023 2237   EOSABS 0.1 10/25/2023 2237   EOSABS 0.4 04/03/2023 1406   BASOSABS 0.0 10/25/2023 2237   BASOSABS 0.1 04/03/2023 1406   Recent Labs    04/03/23 1406 10/25/23 2237 10/26/23 0738 10/26/23 1453 10/26/23 1919 10/27/23 0332 11/07/23 1429  HGB 14.3 8.5* 8.0* 7.8* 8.1* 7.7* 8.8*    CMP     Component Value Date/Time   NA 139 11/07/2023 1429   NA 143 04/03/2023 1406   K 4.8 11/07/2023 1429   CL 108 11/07/2023 1429   CO2 22 11/07/2023 1429   GLUCOSE 141 (H) 11/07/2023 1429   BUN 53 (H) 11/07/2023 1429   BUN 28 (H) 04/03/2023 1406  CREATININE 2.23 (H) 11/07/2023 1429   CREATININE 1.54 (H) 01/22/2012 0817   CALCIUM  9.0 11/07/2023 1429   PROT 6.4 (L) 10/25/2023 2237   PROT 6.6 04/03/2023 1406   ALBUMIN  3.7 10/25/2023 2237   ALBUMIN  4.4 04/03/2023 1406   AST 17 10/25/2023 2237   ALT 19 10/25/2023 2237   ALKPHOS 83 10/25/2023 2237   BILITOT 0.8 10/25/2023 2237   BILITOT 0.3 04/03/2023 1406   GFRNONAA 30 (L) 11/07/2023 1429   GFRNONAA 47 (L) 01/22/2012 0817   GFRAA 45 (L) 10/14/2017 0553   GFRAA 54 (L) 01/22/2012 0817      Latest Ref Rng & Units 10/25/2023    10:37 PM 04/03/2023    2:06 PM 11/03/2022    2:02 PM  Hepatic Function  Total Protein 6.5 - 8.1 g/dL 6.4  6.6  6.7   Albumin  3.5 - 5.0 g/dL 3.7  4.4  4.2   AST 15 - 41 U/L 17  18  19    ALT 0 - 44 U/L 19  16  12    Alk Phosphatase 38 - 126 U/L 83  100  131   Total Bilirubin 0.0 - 1.2 mg/dL 0.8  0.3  0.3       Review of Systems   All systems reviewed and negative except where noted in HPI.    Physical Exam  BP 99/62   Pulse 83   Temp 97.9 F (36.6 C)   Ht 6' (1.829 m)   Wt 171 lb 6.4 oz (77.7 kg)   SpO2 97%   BMI 23.25 kg/m  No LMP for male patient. General:   Alert and oriented. Pleasant and cooperative. Well-nourished and well-developed.  Head:  Normocephalic and atraumatic. Eyes:  Without icterus Ears:  Normal auditory acuity. Neck:  Supple; no masses or thyromegaly. Lungs:  Respirations even and unlabored.  Clear throughout to auscultation.   No wheezes, crackles, or rhonchi. No acute distress. Heart:  Regular rate and rhythm; no murmurs, clicks, rubs, or gallops. Abdomen:  Normal bowel sounds.  No bruits.  Soft, non-tender and non-distended without masses, hepatosplenomegaly or hernias noted.  No guarding or rebound tenderness.  Rectal:  Deferred. Msk:  Symmetrical without gross deformities. Normal posture. Extremities:  Without edema. Neurologic:  Alert and  oriented x4;  grossly normal neurologically. Skin:  Intact without significant lesions or rashes. Psych:  Alert and cooperative. Normal mood and affect.   Assessment & Plan   Xavier Hebert is a 78 y.o. male presenting today with anemia and diarrhea.   Anemia:  - will get updates labs including CBC, CMET, B12, folate, methylmalonic acid, homocysteine, APCA, and intrinsic factor antibody.   - Plan for colonoscopy. Patient is scheduled with [facility] for colonoscopy on Nov. 4.  Patient reports that he keep scheduled colonoscopy date but will follow-up here with all GI concerns.    Diarrhea: - Will check for C.  difficile, stool pathogens. - Further recommendations pending labs.   Follow-up in 6 weeks. Patient was advised to call back or seek an in-person evaluation if the symptoms worsen or if the condition fails to improve as anticipated.  Grayce Bohr, DNP, AGNP-C [facility] Gastroenter[facility]

## 2023-12-27 ENCOUNTER — Ambulatory Visit (INDEPENDENT_AMBULATORY_CARE_PROVIDER_SITE_OTHER): Admitting: Family Medicine

## 2023-12-27 ENCOUNTER — Encounter: Payer: Self-pay | Admitting: Family Medicine

## 2023-12-27 VITALS — BP 99/62 | HR 83 | Temp 97.9°F | Ht 72.0 in | Wt 171.4 lb

## 2023-12-27 DIAGNOSIS — E538 Deficiency of other specified B group vitamins: Secondary | ICD-10-CM | POA: Diagnosis not present

## 2023-12-27 DIAGNOSIS — D519 Vitamin B12 deficiency anemia, unspecified: Secondary | ICD-10-CM

## 2023-12-27 DIAGNOSIS — R197 Diarrhea, unspecified: Secondary | ICD-10-CM

## 2023-12-28 ENCOUNTER — Ambulatory Visit: Payer: Self-pay | Admitting: Family Medicine

## 2024-01-01 LAB — GI PROFILE, STOOL, PCR

## 2024-01-02 ENCOUNTER — Telehealth: Payer: Self-pay

## 2024-01-02 DIAGNOSIS — M47816 Spondylosis without myelopathy or radiculopathy, lumbar region: Secondary | ICD-10-CM | POA: Insufficient documentation

## 2024-01-02 MED ORDER — ONDANSETRON HCL 4 MG PO TABS: 4.0000 mg | ORAL_TABLET | Freq: Three times a day (TID) | ORAL | 1 refills | Status: DC | PRN

## 2024-01-02 NOTE — Telephone Encounter (Signed)
 Patient is requesting medication for nausea. Zofran .

## 2024-01-02 NOTE — Telephone Encounter (Signed)
 I sent in ondansetron  (Zofran ). Thanks.

## 2024-01-02 NOTE — Telephone Encounter (Signed)
 Patient notified.  GI stool profile with norovirus.    Dorothe, please call patient to let him know his stool was positive for norovirus or the stomach bug.  This is very contagious so practice good hand hygiene and disinfect surfaces.  There is no specific treatment, stay hydrated and drink plenty of fluids.  Hopefully this will clear up soon.  If he feels worse, fever, bloody stools, dehydrated, go to the emergency room.    Thanks. Robin E. GI Profile, Stool, PCR

## 2024-01-04 LAB — COMPREHENSIVE METABOLIC PANEL WITH GFR
ALT: 18 IU/L (ref 0–44)
AST: 13 IU/L (ref 0–40)
Albumin: 4.2 g/dL (ref 3.8–4.8)
Alkaline Phosphatase: 115 IU/L (ref 47–123)
BUN/Creatinine Ratio: 22 (ref 10–24)
BUN: 46 mg/dL — ABNORMAL HIGH (ref 8–27)
Bilirubin Total: <0.2 mg/dL (ref 0.0–1.2)
CO2: 19 mmol/L — ABNORMAL LOW (ref 20–29)
Calcium: 9.3 mg/dL (ref 8.6–10.2)
Chloride: 108 mmol/L — ABNORMAL HIGH (ref 96–106)
Creatinine, Ser: 2.09 mg/dL — ABNORMAL HIGH (ref 0.76–1.27)
Globulin, Total: 2.5 g/dL (ref 1.5–4.5)
Glucose: 142 mg/dL — ABNORMAL HIGH (ref 70–99)
Potassium: 4.5 mmol/L (ref 3.5–5.2)
Sodium: 142 mmol/L (ref 134–144)
Total Protein: 6.7 g/dL (ref 6.0–8.5)
eGFR: 32 mL/min/1.73 — ABNORMAL LOW (ref >59)

## 2024-01-04 LAB — CBC WITH DIFFERENTIAL/PLATELET
Basophils Absolute: 0.1 x10E3/uL (ref 0.0–0.2)
Basos: 1 %
EOS (ABSOLUTE): 0.4 x10E3/uL (ref 0.0–0.4)
Eos: 7 %
Hematocrit: 30.9 % — ABNORMAL LOW (ref 37.5–51.0)
Hemoglobin: 10.2 g/dL — ABNORMAL LOW (ref 13.0–17.7)
Immature Grans (Abs): 0 x10E3/uL (ref 0.0–0.1)
Immature Granulocytes: 0 %
Lymphocytes Absolute: 0.9 x10E3/uL (ref 0.7–3.1)
Lymphs: 18 %
MCH: 32.6 pg (ref 26.6–33.0)
MCHC: 33 g/dL (ref 31.5–35.7)
MCV: 99 fL — ABNORMAL HIGH (ref 79–97)
Monocytes Absolute: 0.4 x10E3/uL (ref 0.1–0.9)
Monocytes: 9 %
Neutrophils Absolute: 3.3 x10E3/uL (ref 1.4–7.0)
Neutrophils: 65 %
Platelets: 159 x10E3/uL (ref 150–450)
RBC: 3.13 x10E6/uL — ABNORMAL LOW (ref 4.14–5.80)
RDW: 12.3 % (ref 11.6–15.4)
WBC: 5.1 x10E3/uL (ref 3.4–10.8)

## 2024-01-04 LAB — B12 AND FOLATE PANEL
Folate: 9.6 ng/mL (ref >3.0)
Vitamin B-12: >2000 pg/mL — ABNORMAL HIGH (ref 232–1245)

## 2024-01-04 LAB — HOMOCYSTEINE: Homocysteine: 13 umol/L (ref 0.0–19.2)

## 2024-01-04 LAB — METHYLMALONIC ACID, SERUM: Methylmalonic Acid: 238 nmol/L (ref 0–378)

## 2024-01-04 LAB — INTRINSIC FACTOR ANTIBODIES: Intrinsic Factor Abs, Serum: 12.8 [AU]/ml — ABNORMAL HIGH (ref 0.0–1.1)

## 2024-01-04 LAB — ANTI-PARIETAL ANTIBODY: Parietal Cell Ab: 0.9 U (ref 0.0–20.0)

## 2024-01-08 ENCOUNTER — Ambulatory Visit: Admitting: Podiatry

## 2024-01-08 ENCOUNTER — Encounter: Payer: Self-pay | Admitting: Podiatry

## 2024-01-08 VITALS — Ht 72.0 in | Wt 171.4 lb

## 2024-01-08 DIAGNOSIS — B351 Tinea unguium: Secondary | ICD-10-CM | POA: Diagnosis not present

## 2024-01-08 DIAGNOSIS — M79675 Pain in left toe(s): Secondary | ICD-10-CM

## 2024-01-08 DIAGNOSIS — M79674 Pain in right toe(s): Secondary | ICD-10-CM

## 2024-01-08 NOTE — Progress Notes (Signed)
 Chief Complaint  Patient presents with   Nail Problem    Pt is here for [facility].    SUBJECTIVE Patient with a history of diabetes mellitus presents to office today complaining of elongated, thickened nails that cause pain while ambulating in shoes.  Patient is unable to trim their own nails. Patient is here for further evaluation and treatment.  Past Medical History:  Diagnosis Date   Age-related nuclear cataract of both eyes 01/21/2022   Anxiety    Arthritis    Chronic kidney disease, stage 3a (HCC) 08/16/2022   Aug 16, 2022 Entered By: RONNETTE MAXWELL A Comment: b/l Cr ~2-2.2   Chronic pain syndrome    CKD (chronic kidney disease) stage 3, GFR 30-59 ml/min (HCC)    Depression    Diabetes mellitus without complication (HCC)    Type II   DVT (deep venous thrombosis) (HCC)    Left lower leg   Encounter for screening for eye and ear disorders 12/13/2023   Episodic tension-type headache, not intractable 08/06/2022   Exposure to potentially hazardous substance 08/16/2022   Jul 05, 2022 Entered By: ROSANA DRAGON Comment: Entered automatically through [facility] Problem List documentation program   GERD (gastroesophageal reflux disease)    Headache    Migraine- headache   Hiatal hernia    History of urinary stone 08/06/2022   Hyperlipidemia    Hypertension    Kidney stones    Neck stiffness    limited up and down motion   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, unspecified laterality 11/27/2019   Other abnormalities of gait and mobility 12/25/2023   Peptic ulcer    Sleep apnea    does not use cpap/ pt states he lost 60#   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) 07/17/2019   Status post laser cataract surgery of both eyes 05/10/2022   Unspecified glaucoma 08/06/2022   Wears hearing aid in both ears    Has, does not wear    Allergies  Allergen Reactions   Ivp Dye [Iodinated Contrast Media] Anaphylaxis and Other (See Comments)     Breathing difficulty and chest pain   Quinine Shortness Of Breath, Rash and Other (See Comments)    Other reaction(s): SHORTNESS OF BREATH   Amlodipine  Swelling    Other reaction(s): Edema of lower extremity   Aspirin Other (See Comments)    Stomach pain   Gabapentin     Other reaction(s): Dizziness   Glipizide Other (See Comments)    Other reaction(s): Chest pain   Hydrochlorothiazide Other (See Comments)    Other reaction(s): Impotence   Ibuprofen Other (See Comments)    Stomach pain  Other Reaction(s): ITCHING,WATERING EYES   Ioxaglate Other (See Comments)    Breathing difficulty and chest pain   Latex Itching    Gloves   Metformin And Related Diarrhea   Nsaids     Kidneys   Omeprazole  Other (See Comments)    Other reaction(s): FAILED THERAPY   Pregabalin Other (See Comments)    Other reaction(s): Dizziness   Doxycycline  Anxiety   Pioglitazone Diarrhea and Other (See Comments)    Other reaction(s): Diarrhea, Abdominal pain   Tape Rash    Some bandages and wraps have started causing rashes.  Unsure if it is the adhesive or the bandage material     OBJECTIVE General Patient is awake, alert, and oriented x 3 and in no acute distress. Derm Skin is dry and supple bilateral. Negative open lesions or  macerations. Remaining integument unremarkable. Nails are tender, long, thickened and dystrophic with subungual debris, consistent with onychomycosis, 1-5 bilateral. No signs of infection noted. Vasc  DP and PT pedal pulses palpable bilaterally. Temperature gradient within normal limits.  Neuro Epicritic and protective threshold sensation diminished bilaterally.  Musculoskeletal Exam No symptomatic pedal deformities noted bilateral. Muscular strength within normal limits.  ASSESSMENT 1. Diabetes Mellitus w/ peripheral neuropathy 2.  Pain due to onychomycosis of toenails bilateral  PLAN OF CARE 1. Patient evaluated today. 2. Instructed to maintain good pedal hygiene and foot  care. Stressed importance of controlling blood sugar.  3. Mechanical debridement of nails 1-5 bilaterally performed using a nail nipper. Filed with dremel without incident.  4. Return to clinic in 3 mos.     Thresa EMERSON Sar, DPM [facility]  Dr. Thresa EMERSON Sar, DPM    [address] [place], [place] [address]                Office [phone]  Fax [phone]

## 2024-01-09 NOTE — Progress Notes (Signed)
 Antiparietal antibody negative, homocysteine and methylmalonic acid WNL.  Intrinsic factor antibodies elevated at 12.8. Recommended EGD. [facility]. Patient is already scheduled for EGD and colonoscopy on 01/22/2024.

## 2024-01-14 ENCOUNTER — Telehealth: Payer: Self-pay

## 2024-01-14 NOTE — Telephone Encounter (Signed)
 LVM to see when he thinks he will have the C-Diff lab done.

## 2024-01-15 ENCOUNTER — Ambulatory Visit (INDEPENDENT_AMBULATORY_CARE_PROVIDER_SITE_OTHER): Admitting: Podiatry

## 2024-01-15 DIAGNOSIS — L03115 Cellulitis of right lower limb: Secondary | ICD-10-CM | POA: Diagnosis not present

## 2024-01-15 DIAGNOSIS — R600 Localized edema: Secondary | ICD-10-CM | POA: Diagnosis not present

## 2024-01-15 MED ORDER — DOXYCYCLINE HYCLATE 100 MG PO TABS: 100.0000 mg | ORAL_TABLET | Freq: Two times a day (BID) | ORAL | 0 refills | Status: AC

## 2024-01-15 NOTE — Progress Notes (Signed)
 Chief Complaint  Patient presents with   Foot Pain    He has bilateral swelling in his feet. The right has larger swelling. There is a pocket of fluid on his right leg. He states this came back after his last visit with Dr. Janit. Very tender to the touch    HPI: 78 y.o. male presenting today for recurrence of edema to the bilateral lower extremities with erythema consistent with cellulitis.  He does have a history of cellulitis with edema.  He has noticed blisters developing to the anterior aspect of the right leg.  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  Past Medical History:  Diagnosis Date   Age-related nuclear cataract of both eyes 01/21/2022   Anxiety    Arthritis    Chronic kidney disease, stage 3a (HCC) 08/16/2022   Aug 16, 2022 Entered By: RONNETTE MAXWELL A Comment: b/l Cr ~2-2.2   Chronic pain syndrome    CKD (chronic kidney disease) stage 3, GFR 30-59 ml/min (HCC)    Depression    Diabetes mellitus without complication (HCC)    Type II   DVT (deep venous thrombosis) (HCC)    Left lower leg   Encounter for screening for eye and ear disorders 12/13/2023   Episodic tension-type headache, not intractable 08/06/2022   Exposure to potentially hazardous substance 08/16/2022   Jul 05, 2022 Entered By: ROSANA DRAGON Comment: Entered automatically through [facility] Problem List documentation program   GERD (gastroesophageal reflux disease)    Headache    Migraine- headache   Hiatal hernia    History of urinary stone 08/06/2022   Hyperlipidemia    Hypertension    Kidney stones    Neck stiffness    limited up and down motion   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, unspecified laterality 11/27/2019   Other abnormalities of gait and mobility 12/25/2023   Peptic ulcer    Sleep apnea    does not use cpap/ pt states he lost 60#   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) 07/17/2019   Status post laser  cataract surgery of both eyes 05/10/2022   Unspecified glaucoma 08/06/2022   Wears hearing aid in both ears    Has, does not wear    Past Surgical History:  Procedure Laterality Date   ANTERIOR CERVICAL DECOMP/DISCECTOMY FUSION  2009   CARPAL TUNNEL RELEASE Left 01/03/2018   Procedure: CARPAL TUNNEL RELEASE;  Surgeon: Kathlynn Sharper, MD;  Location: [facility];  Service: Orthopedics;  Laterality: Left;   CARPAL TUNNEL RELEASE Right 07/17/2023   Procedure: CARPAL TUNNEL RELEASE;  Surgeon: Kathlynn Sharper, MD;  Location: [facility];  Service: Orthopedics;  Laterality: Right;   CARPOMETACARPAL (CMC) FUSION OF THUMB Left 08/10/2020   Procedure: CARPOMETACARPAL [facility]) FUSION OF THUMB;  Surgeon: Kathlynn Sharper, MD;  Location: [facility];  Service: Orthopedics;  Laterality: Left;   COLONOSCOPY WITH PROPOFOL  N/A 11/21/2017   Procedure: COLONOSCOPY WITH PROPOFOL ;  Surgeon: Toledo, Ladell POUR, MD;  Location: [facility];  Service: Gastroenterology;  Laterality: N/A;   CYSTOSCOPY     x3- 2 times for stone removal    ESOPHAGOGASTRODUODENOSCOPY N/A 10/14/2017   Procedure: ESOPHAGOGASTRODUODENOSCOPY (EGD);  Surgeon: Unk Corinn Skiff, MD;  Location: [facility];  Service: Gastroenterology;  Laterality: N/A;   ESOPHAGOGASTRODUODENOSCOPY (EGD) WITH PROPOFOL  N/A 10/25/2017   Procedure: ESOPHAGOGASTRODUODENOSCOPY (EGD) WITH PROPOFOL ;  Surgeon: Unk Corinn Skiff, MD;  Location: [facility];  Service: Gastroenterology;  Laterality: N/A;  JOINT REPLACEMENT     bil. knees , shoulder rt, and neck    REVERSE SHOULDER ARTHROPLASTY Right 12/01/2014   REVERSE SHOULDER ARTHROPLASTY Right 12/01/2014   Procedure: REVERSE SHOULDER ARTHROPLASTY;  Surgeon: Glendia Cordella Hutchinson, MD;  Location: [facility];  Service: Orthopedics;  Laterality: Right;   SHOULDER ARTHROSCOPY W/ ROTATOR CUFF REPAIR Right 06/10/2014   failed   SHOULDER OPEN ROTATOR CUFF REPAIR Left ~ 2008   TOTAL KNEE ARTHROPLASTY Bilateral 2005-2008    right-left   TRIGGER FINGER RELEASE Right 2001   TRIGGER FINGER RELEASE Left 08/10/2020   Procedure: RELEASE TRIGGER FINGER/A-1 PULLEY;  Surgeon: Kathlynn Sharper, MD;  Location: [facility];  Service: Orthopedics;  Laterality: Left;   ULNAR TUNNEL RELEASE Right 07/17/2023   Procedure: RELEASE, CUBITAL TUNNEL;  Surgeon: Kathlynn Sharper, MD;  Location: [facility];  Service: Orthopedics;  Laterality: Right;   URETERAL STENT PLACEMENT      Allergies  Allergen Reactions   Ivp Dye [Iodinated Contrast Media] Anaphylaxis and Other (See Comments)    Breathing difficulty and chest pain   Quinine Shortness Of Breath, Rash and Other (See Comments)    Other reaction(s): SHORTNESS OF BREATH   Amlodipine  Swelling    Other reaction(s): Edema of lower extremity   Aspirin Other (See Comments)    Stomach pain   Gabapentin     Other reaction(s): Dizziness   Glipizide Other (See Comments)    Other reaction(s): Chest pain   Hydrochlorothiazide Other (See Comments)    Other reaction(s): Impotence   Ibuprofen Other (See Comments)    Stomach pain  Other Reaction(s): ITCHING,WATERING EYES   Ioxaglate Other (See Comments)    Breathing difficulty and chest pain   Latex Itching    Gloves   Metformin And Related Diarrhea   Nsaids     Kidneys   Omeprazole  Other (See Comments)    Other reaction(s): FAILED THERAPY   Pregabalin Other (See Comments)    Other reaction(s): Dizziness   Doxycycline  Anxiety   Pioglitazone Diarrhea and Other (See Comments)    Other reaction(s): Diarrhea, Abdominal pain   Tape Rash    Some bandages and wraps have started causing rashes.  Unsure if it is the adhesive or the bandage material    RT leg 02/06/2023  Physical Exam: General: The patient is alert and oriented x3 in no acute distress.  Dermatology: Superficial partial-thickness serous blister/bul noted to the anterior aspect of the right legla  Vascular: Heavy edema noted to the right lower extremity and to a  lesser extent the left lower extremity with associated erythema consistent with cellulitis  Neurological: Grossly intact via light touch  Musculoskeletal Exam: No pedal deformities noted.  No crepitus to the leg.  Calf is soft and supple.  Assessment/Plan of Care: 1.  Edema bilateral lower extremities 2.  H/o DVT RLE 3.  Superficial partial-thickness serous blisters right anterior leg 4.  Cellulitis right leg  -Patient evaluated -Multilayer Unna boot compression wrap was applied to the bilateral lower extremities to address the heavy edema -Prescription for doxycycline  100 mg twice daily #20.  Patient does have allergic reaction with anxiety which was discussed but he is willing to take the medication.  I have prescribed the patient doxycycline  in the past which helped significantly and he did not have any reactions at that time -Return to clinic this Friday, 01/18/2024      Thresa EMERSON Sar, DPM [facility]  Dr. Thresa EMERSON Sar, DPM  [address] [place], [place] [address]                Office [phone]  Fax [phone]

## 2024-01-18 ENCOUNTER — Ambulatory Visit: Admitting: Podiatry

## 2024-01-18 ENCOUNTER — Encounter: Payer: Self-pay | Admitting: Podiatry

## 2024-01-18 VITALS — Ht 72.0 in | Wt 171.4 lb

## 2024-01-18 DIAGNOSIS — L03115 Cellulitis of right lower limb: Secondary | ICD-10-CM

## 2024-01-18 DIAGNOSIS — R600 Localized edema: Secondary | ICD-10-CM

## 2024-01-18 NOTE — Progress Notes (Signed)
 Chief Complaint  Patient presents with   Foot Pain    Pt is here to f/u on bilateral legs due to cellulitis.    HPI: 78 y.o. male presenting today for recurrence of edema to the bilateral lower extremities with erythema consistent with cellulitis.  He does have a history of cellulitis with edema.  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  Past Medical History:  Diagnosis Date   Age-related nuclear cataract of both eyes 01/21/2022   Anxiety    Arthritis    Chronic kidney disease, stage 3a (HCC) 08/16/2022   Aug 16, 2022 Entered By: RONNETTE MAXWELL A Comment: b/l Cr ~2-2.2   Chronic pain syndrome    CKD (chronic kidney disease) stage 3, GFR 30-59 ml/min (HCC)    Depression    Diabetes mellitus without complication (HCC)    Type II   DVT (deep venous thrombosis) (HCC)    Left lower leg   Encounter for screening for eye and ear disorders 12/13/2023   Episodic tension-type headache, not intractable 08/06/2022   Exposure to potentially hazardous substance 08/16/2022   Jul 05, 2022 Entered By: ROSANA DRAGON Comment: Entered automatically through [facility] Problem List documentation program   GERD (gastroesophageal reflux disease)    Headache    Migraine- headache   Hiatal hernia    History of urinary stone 08/06/2022   Hyperlipidemia    Hypertension    Kidney stones    Neck stiffness    limited up and down motion   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, unspecified laterality 11/27/2019   Other abnormalities of gait and mobility 12/25/2023   Peptic ulcer    Sleep apnea    does not use cpap/ pt states he lost 60#   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) 07/17/2019   Status post laser cataract surgery of both eyes 05/10/2022   Unspecified glaucoma 08/06/2022   Wears hearing aid in both ears    Has, does not wear    Past Surgical History:  Procedure Laterality Date   ANTERIOR CERVICAL DECOMP/DISCECTOMY  FUSION  2009   CARPAL TUNNEL RELEASE Left 01/03/2018   Procedure: CARPAL TUNNEL RELEASE;  Surgeon: Kathlynn Sharper, MD;  Location: [facility];  Service: Orthopedics;  Laterality: Left;   CARPAL TUNNEL RELEASE Right 07/17/2023   Procedure: CARPAL TUNNEL RELEASE;  Surgeon: Kathlynn Sharper, MD;  Location: [facility];  Service: Orthopedics;  Laterality: Right;   CARPOMETACARPAL (CMC) FUSION OF THUMB Left 08/10/2020   Procedure: CARPOMETACARPAL [facility]) FUSION OF THUMB;  Surgeon: Kathlynn Sharper, MD;  Location: [facility];  Service: Orthopedics;  Laterality: Left;   COLONOSCOPY WITH PROPOFOL  N/A 11/21/2017   Procedure: COLONOSCOPY WITH PROPOFOL ;  Surgeon: Toledo, Ladell POUR, MD;  Location: [facility];  Service: Gastroenterology;  Laterality: N/A;   CYSTOSCOPY     x3- 2 times for stone removal    ESOPHAGOGASTRODUODENOSCOPY N/A 10/14/2017   Procedure: ESOPHAGOGASTRODUODENOSCOPY (EGD);  Surgeon: Unk Corinn Skiff, MD;  Location: [facility];  Service: Gastroenterology;  Laterality: N/A;   ESOPHAGOGASTRODUODENOSCOPY (EGD) WITH PROPOFOL  N/A 10/25/2017   Procedure: ESOPHAGOGASTRODUODENOSCOPY (EGD) WITH PROPOFOL ;  Surgeon: Unk Corinn Skiff, MD;  Location: [facility];  Service: Gastroenterology;  Laterality: N/A;   JOINT REPLACEMENT     bil. knees , shoulder rt, and neck    REVERSE SHOULDER ARTHROPLASTY Right 12/01/2014   REVERSE SHOULDER ARTHROPLASTY Right 12/01/2014   Procedure: REVERSE SHOULDER ARTHROPLASTY;  Surgeon: Glendia Cordella Hutchinson, MD;  Location: [facility];  Service: Orthopedics;  Laterality: Right;   SHOULDER ARTHROSCOPY W/ ROTATOR CUFF REPAIR Right 06/10/2014   failed   SHOULDER OPEN ROTATOR CUFF REPAIR Left ~ 2008   TOTAL KNEE ARTHROPLASTY Bilateral 2005-2008   right-left   TRIGGER FINGER RELEASE Right 2001   TRIGGER FINGER RELEASE Left 08/10/2020   Procedure: RELEASE TRIGGER FINGER/A-1 PULLEY;  Surgeon: Kathlynn Sharper, MD;  Location: [facility];  Service: Orthopedics;  Laterality: Left;    ULNAR TUNNEL RELEASE Right 07/17/2023   Procedure: RELEASE, CUBITAL TUNNEL;  Surgeon: Kathlynn Sharper, MD;  Location: [facility];  Service: Orthopedics;  Laterality: Right;   URETERAL STENT PLACEMENT      Allergies  Allergen Reactions   Ivp Dye [Iodinated Contrast Media] Anaphylaxis and Other (See Comments)    Breathing difficulty and chest pain   Quinine Shortness Of Breath, Rash and Other (See Comments)    Other reaction(s): SHORTNESS OF BREATH   Amlodipine  Swelling    Other reaction(s): Edema of lower extremity   Aspirin Other (See Comments)    Stomach pain   Gabapentin     Other reaction(s): Dizziness   Glipizide Other (See Comments)    Other reaction(s): Chest pain   Hydrochlorothiazide Other (See Comments)    Other reaction(s): Impotence   Ibuprofen Other (See Comments)    Stomach pain  Other Reaction(s): ITCHING,WATERING EYES   Ioxaglate Other (See Comments)    Breathing difficulty and chest pain   Latex Itching    Gloves   Metformin And Related Diarrhea   Nsaids     Kidneys   Omeprazole  Other (See Comments)    Other reaction(s): FAILED THERAPY   Pregabalin Other (See Comments)    Other reaction(s): Dizziness   Doxycycline  Anxiety   Pioglitazone Diarrhea and Other (See Comments)    Other reaction(s): Diarrhea, Abdominal pain   Tape Rash    Some bandages and wraps have started causing rashes.  Unsure if it is the adhesive or the bandage material    RT leg 01/18/2024  Physical Exam: General: The patient is alert and oriented x3 in no acute distress.  Dermatology: Superficial partial-thickness serous blister/bul noted to the anterior aspect of the right leg with surrounding erythema and edema  Vascular: Chronic edema noted to the right lower extremity and to a lesser extent the left lower extremity with associated erythema consistent with cellulitis  Neurological: Grossly intact via light touch  Musculoskeletal Exam: No pedal deformities noted.  No  crepitus to the leg.  Calf is soft and supple.  Assessment/Plan of Care: 1.  Edema bilateral lower extremities 2.  H/o DVT RLE 3.  Superficial partial-thickness serous blisters right anterior leg 4.  Cellulitis right leg  -Patient evaluated -Multilayer Unna boot compression wrap was applied to the bilateral lower extremities to address the heavy edema  -continue oral doxycycline  100 mg twice daily #20 as prescribed -Return to clinic 1 week      Thresa EMERSON Sar, DPM [facility]  Dr. Thresa EMERSON Sar, DPM    [address] [place], [place] [address]                Office [phone]  Fax [phone]

## 2024-01-20 ENCOUNTER — Emergency Department

## 2024-01-20 ENCOUNTER — Emergency Department
Admission: EM | Admit: 2024-01-20 | Discharge: 2024-01-20 | Disposition: A | Discharge status: Home / Self Care | Attending: Emergency Medicine | Admitting: Emergency Medicine

## 2024-01-20 DIAGNOSIS — E1122 Type 2 diabetes mellitus with diabetic chronic kidney disease: Secondary | ICD-10-CM | POA: Diagnosis not present

## 2024-01-20 DIAGNOSIS — I129 Hypertensive chronic kidney disease with stage 1 through stage 4 chronic kidney disease, or unspecified chronic kidney disease: Secondary | ICD-10-CM | POA: Diagnosis not present

## 2024-01-20 DIAGNOSIS — R10A2 Flank pain, left side: Secondary | ICD-10-CM | POA: Insufficient documentation

## 2024-01-20 DIAGNOSIS — N189 Chronic kidney disease, unspecified: Secondary | ICD-10-CM | POA: Insufficient documentation

## 2024-01-20 DIAGNOSIS — D649 Anemia, unspecified: Secondary | ICD-10-CM | POA: Insufficient documentation

## 2024-01-20 LAB — URINALYSIS, ROUTINE W REFLEX MICROSCOPIC
Bacteria, UA: NONE SEEN
Bilirubin Urine: NEGATIVE
Glucose, UA: >=500 mg/dL — AB
Ketones, ur: NEGATIVE mg/dL
Leukocytes,Ua: NEGATIVE
Nitrite: NEGATIVE
Protein, ur: 30 mg/dL — AB
RBC / HPF: >50 RBC/hpf (ref 0–5)
Specific Gravity, Urine: 1.018 (ref 1.005–1.030)
Squamous Epithelial / HPF: 0 /HPF (ref 0–5)
pH: 5 (ref 5.0–8.0)

## 2024-01-20 LAB — CBC
HCT: 32 % — ABNORMAL LOW (ref 39.0–52.0)
Hemoglobin: 10.4 g/dL — ABNORMAL LOW (ref 13.0–17.0)
MCH: 32.3 pg (ref 26.0–34.0)
MCHC: 32.5 g/dL (ref 30.0–36.0)
MCV: 99.4 fL (ref 80.0–100.0)
Platelets: 189 K/uL (ref 150–400)
RBC: 3.22 MIL/uL — ABNORMAL LOW (ref 4.22–5.81)
RDW: 12.6 % (ref 11.5–15.5)
WBC: 5.9 K/uL (ref 4.0–10.5)
nRBC: 0 % (ref 0.0–0.2)

## 2024-01-20 LAB — BASIC METABOLIC PANEL WITH GFR
Anion gap: 12 (ref 5–15)
BUN: 35 mg/dL — ABNORMAL HIGH (ref 8–23)
CO2: 24 mmol/L (ref 22–32)
Calcium: 8.8 mg/dL — ABNORMAL LOW (ref 8.9–10.3)
Chloride: 102 mmol/L (ref 98–111)
Creatinine, Ser: 2.12 mg/dL — ABNORMAL HIGH (ref 0.61–1.24)
GFR, Estimated: 32 mL/min — ABNORMAL LOW (ref >60)
Glucose, Bld: 138 mg/dL — ABNORMAL HIGH (ref 70–99)
Potassium: 4.4 mmol/L (ref 3.5–5.1)
Sodium: 138 mmol/L (ref 135–145)

## 2024-01-20 MED ORDER — MORPHINE SULFATE (PF) 4 MG/ML IV SOLN
4.0000 mg | Freq: Once | INTRAVENOUS | Status: AC
  Administered 2024-01-20: 4 mg via INTRAVENOUS
  Filled 2024-01-20: qty 1

## 2024-01-20 MED ORDER — ONDANSETRON HCL 4 MG/2ML IJ SOLN
4.0000 mg | Freq: Once | INTRAMUSCULAR | Status: AC
  Administered 2024-01-20: 4 mg via INTRAVENOUS
  Filled 2024-01-20: qty 2

## 2024-01-20 MED ORDER — HYDROMORPHONE HCL 1 MG/ML IJ SOLN
1.0000 mg | Freq: Once | INTRAMUSCULAR | Status: AC
  Administered 2024-01-20: 1 mg via INTRAVENOUS
  Filled 2024-01-20: qty 1

## 2024-01-20 NOTE — ED Triage Notes (Signed)
 Pt presents to the ED via POV from home. Pt reports left sided flank pain x3hrs. Pt reports hx of kidney stones and states that this feels the same. Pt reports nausea and vomiting. Pt A&Ox4.

## 2024-01-20 NOTE — ED Provider Notes (Signed)
 [facility] Provider Note    Event Date/Time   First MD Initiated Contact with Patient 01/20/24 1533     (approximate)   History   Flank Pain   HPI  Xavier Hebert is a 78 y.o. male HTN, diabetes, chronic pain syndrome, CKD and kidney stones for evaluation of left-sided flank pain.  Pain began a few hours earlier today.  Reports the pain is sharp and radiates from his left flank around to the abdomen.  Endorses nausea but denies vomiting, diarrhea, constipation, urinary symptoms and fever.  States that this pain feels similar to when he had a stone in the past.      Physical Exam   Triage Vital Signs: ED Triage Vitals  Encounter Vitals Group     BP 01/20/24 1442 (!) 156/96     Girls Systolic BP Percentile --      Girls Diastolic BP Percentile --      Boys Systolic BP Percentile --      Boys Diastolic BP Percentile --      Pulse Rate 01/20/24 1442 80     Resp 01/20/24 1442 18     Temp 01/20/24 1442 97.7 F (36.5 C)     Temp Source 01/20/24 1442 Oral     SpO2 01/20/24 1442 100 %     Weight 01/20/24 1440 169 lb 12.1 oz (77 kg)     Height 01/20/24 1440 6' (1.829 m)     Head Circumference --      Peak Flow --      Pain Score 01/20/24 1440 10     Pain Loc --      Pain Education --      Exclude from Growth Chart --     Most recent vital signs: Vitals:   01/20/24 1617 01/20/24 1808  BP:  (!) 150/90  Pulse:  79  Resp:  20  Temp:  97.8 F (36.6 C)  SpO2: 100% 100%   General: Awake, no distress.  CV:  Good peripheral perfusion.  RRR. Resp:  Normal effort.  CTAB. Abd:  No distention.  Soft, no tenderness to palpation, tender to palpation over the left CVA Other:     ED Results / Procedures / Treatments   Labs (all labs ordered are listed, but only abnormal results are displayed) Labs Reviewed  URINALYSIS, ROUTINE W REFLEX MICROSCOPIC - Abnormal; Notable for the following components:      Result Value   Color, Urine YELLOW (*)     APPearance CLEAR (*)    Glucose, UA >=500 (*)    Hgb urine dipstick LARGE (*)    Protein, ur 30 (*)    All other components within normal limits  BASIC METABOLIC PANEL WITH GFR - Abnormal; Notable for the following components:   Glucose, Bld 138 (*)    BUN 35 (*)    Creatinine, Ser 2.12 (*)    Calcium  8.8 (*)    GFR, Estimated 32 (*)    All other components within normal limits  CBC - Abnormal; Notable for the following components:   RBC 3.22 (*)    Hemoglobin 10.4 (*)    HCT 32.0 (*)    All other components within normal limits    RADIOLOGY  CT renal stone study obtained, I interpreted the images as well as reviewed the radiologist report.  See ED course for interpretation.  PROCEDURES:  Critical Care performed: No  Procedures   MEDICATIONS ORDERED IN ED: Medications  morphine  (PF) 4 MG/ML injection 4 mg (4 mg Intravenous Given 01/20/24 1616)  ondansetron  (ZOFRAN ) injection 4 mg (4 mg Intravenous Given 01/20/24 1616)  HYDROmorphone  (DILAUDID ) injection 1 mg (1 mg Intravenous Given 01/20/24 1755)     IMPRESSION / MDM / ASSESSMENT AND PLAN / ED COURSE  I reviewed the triage vital signs and the nursing notes.                             78 year old male presents for evaluation of left-sided flank pain.  Blood pressure is elevated but patient is pretty uncomfortable and does have a history of hypertension.  Vital signs stable otherwise.  Differential diagnosis includes, but is not limited to, nephrolithiasis, pyelonephritis, UTI, AKI, muscle strain, lower lobe pneumonia, diverticulitis, gastroenteritis.  Patient's presentation is most consistent with acute complicated illness / injury requiring diagnostic workup.  CBC shows anemia but this is consistent with patient's baseline.  BMP shows elevated BUN and creatinine and decreased GFR consistent with his baseline.  Urinalysis shows presence of glucose, large hemoglobin, protein and greater than 50 RBCs.  Given that patient  has microscopic hematuria and reports this pain is consistent with previous kidney stones we will plan to obtain CT renal stone study for further evaluation.  Patient was very uncomfortable on exam so we will treat his pain with IV morphine  and nausea with Zofran .   Clinical Course as of 01/20/24 CATHLYN Repress Jan 20, 2024  1725 Patient still reporting 10/10 pain after receiving morphine , will give patient dilaudid  and reassess. [LD]  1726 CT Renal Stone Study No obstructing calculus, suspect patient may have had a stone and passed it or it was missed on scan. Will plan to treat patients pain and nausea.  IMPRESSION: 1. Mild left hydroureteronephrosis without obstructing calculus. 2. Stable left renal cysts. 3. Sigmoid diverticulosis without inflammation.   [LD]  [id number] Patient reassessed and he reports improvement of his pain with the Dilaudid .  Since patient has a pain contract I explained that I would not be sending patient with another prescription for pain medication which he was aware and okay with.  Discussed taking his pain medications as previously prescribed.  Reviewed when to return for follow-up.  Patient voiced understanding, all questions were answered and he was stable at discharge. [LD]    Clinical Course User Index [LD] Cleaster Tinnie LABOR, PA-C     FINAL CLINICAL IMPRESSION(S) / ED DIAGNOSES   Final diagnoses:  Left flank pain     Rx / DC Orders   ED Discharge Orders     None        Note:  This document was prepared using Dragon voice recognition software and may include unintentional dictation errors.   Cleaster Tinnie LABOR, PA-C 01/20/24 CATHLYN Willo Dunnings, MD 01/20/24 RENARD

## 2024-01-20 NOTE — Discharge Instructions (Signed)
 Your blood work was consistent with previous labs.  Your urinalysis showed presence of glucose, blood, protein but no bacteria.  The presence of blood in the urinalysis is consistent with a kidney stone.  The CT scan showed findings suspicious of a kidney stone but they did not identify a stone on the scan.  This may be because you recently passed a stone or you have a stone that was not seen today.  Your pain should improve over the next few days so if it is not getting better please follow-up with another healthcare provider.  This could be your care, urgent care or the emergency department.  Please take your home pain medications as previously prescribed.

## 2024-01-21 ENCOUNTER — Other Ambulatory Visit: Payer: Self-pay

## 2024-01-21 ENCOUNTER — Emergency Department
Admission: EM | Admit: 2024-01-21 | Discharge: 2024-01-22 | Disposition: A | Attending: Emergency Medicine | Admitting: Emergency Medicine

## 2024-01-21 DIAGNOSIS — I2510 Atherosclerotic heart disease of native coronary artery without angina pectoris: Secondary | ICD-10-CM | POA: Insufficient documentation

## 2024-01-21 DIAGNOSIS — E1122 Type 2 diabetes mellitus with diabetic chronic kidney disease: Secondary | ICD-10-CM | POA: Insufficient documentation

## 2024-01-21 DIAGNOSIS — I482 Chronic atrial fibrillation, unspecified: Secondary | ICD-10-CM | POA: Diagnosis not present

## 2024-01-21 DIAGNOSIS — X58XXXA Exposure to other specified factors, initial encounter: Secondary | ICD-10-CM | POA: Diagnosis not present

## 2024-01-21 DIAGNOSIS — R10A2 Flank pain, left side: Secondary | ICD-10-CM | POA: Insufficient documentation

## 2024-01-21 DIAGNOSIS — S3992XA Unspecified injury of lower back, initial encounter: Secondary | ICD-10-CM | POA: Diagnosis present

## 2024-01-21 DIAGNOSIS — S335XXA Sprain of ligaments of lumbar spine, initial encounter: Secondary | ICD-10-CM

## 2024-01-21 DIAGNOSIS — S339XXA Sprain of unspecified parts of lumbar spine and pelvis, initial encounter: Secondary | ICD-10-CM | POA: Diagnosis not present

## 2024-01-21 DIAGNOSIS — I129 Hypertensive chronic kidney disease with stage 1 through stage 4 chronic kidney disease, or unspecified chronic kidney disease: Secondary | ICD-10-CM | POA: Diagnosis not present

## 2024-01-21 DIAGNOSIS — N189 Chronic kidney disease, unspecified: Secondary | ICD-10-CM | POA: Insufficient documentation

## 2024-01-21 DIAGNOSIS — Z7901 Long term (current) use of anticoagulants: Secondary | ICD-10-CM | POA: Insufficient documentation

## 2024-01-21 MED ORDER — LIDOCAINE 5 % EX PTCH
1.0000 | MEDICATED_PATCH | CUTANEOUS | Status: DC
  Administered 2024-01-21: 1 via TRANSDERMAL
  Filled 2024-01-21: qty 1

## 2024-01-21 MED ORDER — ONDANSETRON HCL 4 MG/2ML IJ SOLN: 4.0000 mg | Freq: Once | INTRAMUSCULAR | Status: AC

## 2024-01-21 MED ORDER — HYDROMORPHONE HCL 1 MG/ML IJ SOLN
1.0000 mg | Freq: Once | INTRAMUSCULAR | Status: AC
  Administered 2024-01-21: 1 mg via INTRAVENOUS
  Filled 2024-01-21: qty 1

## 2024-01-21 MED ORDER — ONDANSETRON HCL 4 MG/2ML IJ SOLN
INTRAMUSCULAR | Status: AC
  Administered 2024-01-21: 4 mg via INTRAVENOUS
  Filled 2024-01-21: qty 2

## 2024-01-21 NOTE — ED Provider Notes (Signed)
 [facility] Provider Note    Event Date/Time   First MD Initiated Contact with Patient 01/21/24 2150     (approximate)   History   Flank Pain    HPI  SHANDY VI is a 78 y.o. male    with a past medical history of left flank pain, A-fib, cellulitis, SVT, anemia, diabetes type 2,  who presents to the ED complaining of   left flank pain. According to the patient, he was seen yesterday, for left flank pain.  Independent chart review, patient had a CBC that showed anemia, BMP showed with BUN/creatinine and decreased GFR.  Urinalysis showed large hemoglobin protein and red blood cells more than 50.  Patient was having microscopic hematuria.  Patient received IV morphine , tallowate and Zofran .  Patient had a CT renal stone study that showed left hydro with hydronephrosis without obstructing calculus, establish left renal cyst, sigmoid diverticulosis without inflammation.  Patient presents today with left flank pain that is getting worse.  Patient is taking Eliquis.  Patient endorses having pain management in contract and he can not get prescriptions with opioids.  Patient is here with his wife.    Patient Active Problem List   Diagnosis Date Noted   Contact dermatitis 11/26/2023   Anemia 10/27/2023   GI bleeding 10/26/2023   Near syncope 10/26/2023   B12 deficiency due to diet 11/03/2022   Vitamin D  deficiency, unspecified 11/03/2022   Peripheral venous insufficiency 08/16/2022   Gout, unspecified 08/16/2022   Obstructive sleep apnea 08/16/2022   Hypogonadism in male 08/16/2022   Anxiety 07/08/2022   Chronic headache disorder 07/08/2022   Chronic kidney disease, stage 4 (severe) (HCC) 07/08/2022   Insomnia 07/08/2022   Instability of internal right knee prosthesis 07/08/2022   Stasis dermatitis 07/08/2022   Cataract 04/17/2022   Primary open angle glaucoma (POAG) of left eye, severe stage 01/21/2022   Primary open angle glaucoma (POAG) of right eye, mild  stage 01/21/2022   Chronic pain syndrome 11/27/2019   Displacement of lumbar intervertebral disc without myelopathy 11/27/2019   Gastroesophageal reflux disease with hiatal hernia 11/27/2019   Generalized anxiety disorder 11/27/2019   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 11/27/2019   Valvular heart disease 07/18/2019   Pedal edema 07/17/2019   Dysphagia 10/13/2017   Enthesopathy of ankle and tarsus 08/22/2015   Capsulitis 08/22/2015   Unspecified osteoarthritis, unspecified site 12/01/2014   Coronary artery disease 09/23/2013   Essential (primary) hypertension 09/12/2013   Hyperlipidemia, mixed 09/12/2013   Migraines 09/12/2013   Sleep apnea 09/12/2013   Type 2 diabetes mellitus with kidney complication, with long-term current use of insulin  (HCC) 09/12/2013     Physical Exam   Triage Vital Signs: ED Triage Vitals  Encounter Vitals Group     BP 01/21/24 2129 (!) 148/92     Girls Systolic BP Percentile --      Girls Diastolic BP Percentile --      Boys Systolic BP Percentile --      Boys Diastolic BP Percentile --      Pulse Rate 01/21/24 2129 (!) 103     Resp 01/21/24 2129 16     Temp 01/21/24 2129 98.9 F (37.2 C)     Temp src --      SpO2 01/21/24 2129 97 %     Weight 01/21/24 2133 169 lb 12.1 oz (77 kg)     Height 01/21/24 2133 6' (1.829 m)     Head Circumference --  Peak Flow --      Pain Score 01/21/24 2133 10     Pain Loc --      Pain Education --      Exclude from Growth Chart --     Most recent vital signs: Vitals:   01/21/24 2129 01/21/24 2300  BP: (!) 148/92 (!) 151/87  Pulse: (!) 103 100  Resp: 16 15  Temp: 98.9 F (37.2 C) 98.6 F (37 C)  SpO2: 97% 98%     Physical Exam Vitals and nursing note reviewed.  During triage patient was hypertensive, tachycardic  General:          Awake, no distress.  CV:                  Good peripheral perfusion.  Resp:               Normal effort. no tachypnea Abd:                 No  distention.  Soft nontender Other:              Lumbar spine: Tenderness to palpation in left paraspinal muscles, pain increased with deep breath and movement.  ED Results / Procedures / Treatments   Labs (all labs ordered are listed, but only abnormal results are displayed) Labs Reviewed - No data to display    RADIOLOGY I independently reviewed and interpreted imaging and agree with radiologists findings.      PROCEDURES:  Critical Care performed:   Procedures   MEDICATIONS ORDERED IN ED: Medications  lidocaine  (LIDODERM ) 5 % 1 patch (1 patch Transdermal Patch Applied 01/21/24 2259)  HYDROmorphone  (DILAUDID ) injection 1 mg (1 mg Intravenous Given 01/21/24 2258)  ondansetron  (ZOFRAN ) injection 4 mg (4 mg Intravenous Given 01/21/24 2258)      IMPRESSION / MDM / ASSESSMENT AND PLAN / ED COURSE  I reviewed the triage vital signs and the nursing notes.  Differential diagnosis includes, but is not limited to, lumbar sprain, ureterolithiasis, unlikely pyelonephritis  Patient's presentation is most consistent with acute, uncomplicated illness.   KAYLAN FRIEDMANN is a 78 y.o., male who presents today with history of left lumbar pain.  On a physical exam tenderness to palpation in left paraspinal muscles.  Pain increases with deep breath and movement. Plan Lidocaine  patch Diluid Reassess Reassessed the patient after having Dilaudid .  Patient feels better he can take a deep breath without lumbar pain. Patient's diagnosis is consistent with lumbar spain. I did not order any imaging or labs, checked labs and imaging from yesterday.  Physical exam  is reassuring for lumbar sprain.  Pain increases with deep breath, and movement.  Unlikely to be renal stone colic.  I did review the patient's allergies and medications.The patient is in stable and satisfactory condition for discharge home  Patient will be discharged home without prescriptions.  Patient is taking Eliquis and he has a pain  mining engineer.  I did advise the patient to continue taking his muscle relaxant, apply lidocaine  patch, heat pack and walk every hour.  Advised not to lay down or sit most of the time. Patient is to follow up with pain management as needed or otherwise directed. Patient is given ED precautions to return to the ED for any worsening or new symptoms. Discussed plan of care with patient, answered all of patient's questions, Patient agreeable to plan of care. Advised patient to take medications according to the instructions on the label. Discussed  possible side effects of new medications. Patient verbalized understanding.  FINAL CLINICAL IMPRESSION(S) / ED DIAGNOSES   Final diagnoses:  Lumbar sprain, initial encounter     Rx / DC Orders   ED Discharge Orders     None        Note:  This document was prepared using Dragon voice recognition software and may include unintentional dictation errors.   Janit Kast, PA-C 01/21/24 2320    Willo Dunnings, MD 01/21/24 2324

## 2024-01-21 NOTE — ED Triage Notes (Signed)
 Pt reports he was seen here yesterday and dx with kidney stone, pt has left side flank pain. Pt reports pain has gotten worse and he did not get sent home with RX because pt is already followed by pain clinic.

## 2024-01-21 NOTE — Discharge Instructions (Signed)
 You have been diagnosed with lumbar sprain.  Please continue taking your muscle relaxant.  Take acetaminophen  every 6 hours.  Apply lidocaine  patch onto your skin every 12 hours in the lumbar area.  Can apply heat on your back.  You can make an appointment with your pain management doctor.  Please come back to ED or go to your PCP if you have new symptoms or symptoms worsen.

## 2024-01-23 ENCOUNTER — Other Ambulatory Visit: Payer: Self-pay | Admitting: Family

## 2024-01-23 MED ORDER — EC-RX TESTOSTERONE 20 % TD CREA: 1.0000 | TOPICAL_CREAM | Freq: Two times a day (BID) | TRANSDERMAL | 2 refills | Status: AC

## 2024-01-24 ENCOUNTER — Telehealth: Payer: Self-pay

## 2024-01-24 NOTE — Telephone Encounter (Signed)
 Pharmacy called stating pt's testosterone  cream says to apply to skin but pharmacy needs the amount in grams or mL in order to dispense to pt. Please advise.

## 2024-01-25 ENCOUNTER — Ambulatory Visit (INDEPENDENT_AMBULATORY_CARE_PROVIDER_SITE_OTHER): Admitting: Podiatry

## 2024-01-25 ENCOUNTER — Encounter: Payer: Self-pay | Admitting: Podiatry

## 2024-01-25 VITALS — Ht 72.0 in | Wt 169.8 lb

## 2024-01-25 DIAGNOSIS — L03115 Cellulitis of right lower limb: Secondary | ICD-10-CM

## 2024-01-25 DIAGNOSIS — R600 Localized edema: Secondary | ICD-10-CM | POA: Diagnosis not present

## 2024-01-25 MED ORDER — AMOXICILLIN-POT CLAVULANATE 875-125 MG PO TABS
1.0000 | ORAL_TABLET | Freq: Two times a day (BID) | ORAL | 0 refills | Status: AC
Start: 2024-01-25 — End: ?

## 2024-01-31 ENCOUNTER — Ambulatory Visit

## 2024-02-04 NOTE — Progress Notes (Signed)
 No chief complaint on file.   HPI: 78 y.o. male presenting today for recurrence of edema to the bilateral lower extremities with erythema consistent with cellulitis.  He does have a history of cellulitis with edema.  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  Past Medical History:  Diagnosis Date   Age-related nuclear cataract of both eyes 01/21/2022   Anxiety    Arthritis    Chronic kidney disease, stage 3a (HCC) 08/16/2022   Aug 16, 2022 Entered By: RONNETTE MAXWELL A Comment: b/l Cr ~2-2.2   Chronic pain syndrome    CKD (chronic kidney disease) stage 3, GFR 30-59 ml/min (HCC)    Depression    Diabetes mellitus without complication (HCC)    Type II   DVT (deep venous thrombosis) (HCC)    Left lower leg   Encounter for screening for eye and ear disorders 12/13/2023   Episodic tension-type headache, not intractable 08/06/2022   Exposure to potentially hazardous substance 08/16/2022   Jul 05, 2022 Entered By: ROSANA DRAGON Comment: Entered automatically through [facility] Problem List documentation program   GERD (gastroesophageal reflux disease)    Headache    Migraine- headache   Hiatal hernia    History of urinary stone 08/06/2022   Hyperlipidemia    Hypertension    Kidney stones    Neck stiffness    limited up and down motion   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, unspecified laterality 11/27/2019   Other abnormalities of gait and mobility 12/25/2023   Peptic ulcer    Sleep apnea    does not use cpap/ pt states he lost 60#   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) 07/17/2019   Status post laser cataract surgery of both eyes 05/10/2022   Unspecified glaucoma 08/06/2022   Wears hearing aid in both ears    Has, does not wear    Past Surgical History:  Procedure Laterality Date   ANTERIOR CERVICAL DECOMP/DISCECTOMY FUSION  2009   CARPAL TUNNEL RELEASE Left 01/03/2018   Procedure: CARPAL TUNNEL  RELEASE;  Surgeon: Kathlynn Sharper, MD;  Location: [facility] OR[facility];  Service: Orthopedics;  Laterality: Left;   CARPAL TUNNEL RELEASE Right 07/17/2023   Procedure: CARPAL TUNNEL RELEASE;  Surgeon: Kathlynn Sharper, MD;  Location: [facility];  Service: Orthopedics;  Laterality: Right;   CARPOMETACARPAL ([facility]) FUSION OF THUMB Left 08/10/2020   Procedure: CARPOMETACARPAL [facility]) FUSION OF THUMB;  Surgeon: Kathlynn Sharper, MD;  Location: [facility];  Service: Orthopedics;  Laterality: Left;   COLONOSCOPY WITH PROPOFOL  N/A 11/21/2017   Procedure: COLONOSCOPY WITH PROPOFOL ;  Surgeon: Toledo, Ladell POUR, MD;  Location: [facility];  Service: Gastroenterology;  Laterality: N/A;   CYSTOSCOPY     x3- 2 times for stone removal    ESOPHAGOGASTRODUODENOSCOPY N/A 10/14/2017   Procedure: ESOPHAGOGASTRODUODENOSCOPY (EGD);  Surgeon: Unk Corinn Skiff, MD;  Location: [facility];  Service: Gastroenterology;  Laterality: N/A;   ESOPHAGOGASTRODUODENOSCOPY (EGD) WITH PROPOFOL  N/A 10/25/2017   Procedure: ESOPHAGOGASTRODUODENOSCOPY (EGD) WITH PROPOFOL ;  Surgeon: Unk Corinn Skiff, MD;  Location: [facility];  Service: Gastroenterology;  Laterality: N/A;   JOINT REPLACEMENT     bil. knees , shoulder rt, and neck    REVERSE SHOULDER ARTHROPLASTY Right 12/01/2014   REVERSE SHOULDER ARTHROPLASTY Right 12/01/2014   Procedure: REVERSE SHOULDER ARTHROPLASTY;  Surgeon: Glendia Cordella Hutchinson, MD;  Location: [facility] OR;  Service: Orthopedics;  Laterality: Right;   SHOULDER ARTHROSCOPY W/ ROTATOR CUFF REPAIR Right 06/10/2014  failed   SHOULDER OPEN ROTATOR CUFF REPAIR Left ~ 2008   TOTAL KNEE ARTHROPLASTY Bilateral 2005-2008   right-left   TRIGGER FINGER RELEASE Right 2001   TRIGGER FINGER RELEASE Left 08/10/2020   Procedure: RELEASE TRIGGER FINGER/A-1 PULLEY;  Surgeon: Kathlynn Sharper, MD;  Location: [facility];  Service: Orthopedics;  Laterality: Left;   ULNAR TUNNEL RELEASE Right 07/17/2023   Procedure: RELEASE, CUBITAL TUNNEL;   Surgeon: Kathlynn Sharper, MD;  Location: [facility];  Service: Orthopedics;  Laterality: Right;   URETERAL STENT PLACEMENT      Allergies  Allergen Reactions   Ivp Dye [Iodinated Contrast Media] Anaphylaxis and Other (See Comments)    Breathing difficulty and chest pain   Quinine Shortness Of Breath, Rash and Other (See Comments)    Other reaction(s): SHORTNESS OF BREATH   Amlodipine  Swelling    Other reaction(s): Edema of lower extremity   Aspirin Other (See Comments)    Stomach pain   Gabapentin     Other reaction(s): Dizziness   Glipizide Other (See Comments)    Other reaction(s): Chest pain   Hydrochlorothiazide Other (See Comments)    Other reaction(s): Impotence   Ibuprofen Other (See Comments)    Stomach pain  Other Reaction(s): ITCHING,WATERING EYES   Ioxaglate Other (See Comments)    Breathing difficulty and chest pain   Latex Itching    Gloves   Metformin And Related Diarrhea   Nsaids     Kidneys   Omeprazole  Other (See Comments)    Other reaction(s): FAILED THERAPY   Pregabalin Other (See Comments)    Other reaction(s): Dizziness   Doxycycline  Anxiety   Pioglitazone Diarrhea and Other (See Comments)    Other reaction(s): Diarrhea, Abdominal pain   Tape Rash    Some bandages and wraps have started causing rashes.  Unsure if it is the adhesive or the bandage material    RT leg 01/18/2024  Physical Exam: General: The patient is alert and oriented x3 in no acute distress.  Dermatology: Superficial partial-thickness serous blister/bul noted to the anterior aspect of the right leg with surrounding erythema and edema  Vascular: Chronic edema noted to the right lower extremity and to a lesser extent the left lower extremity with associated erythema consistent with cellulitis  Neurological: Grossly intact via light touch  Musculoskeletal Exam: No pedal deformities noted.  No crepitus to the leg.  Calf is soft and supple.  Assessment/Plan of Care: 1.   Edema bilateral lower extremities 2.  H/o DVT RLE 3.  Superficial partial-thickness serous blisters right anterior leg 4.  Cellulitis right leg  -Patient evaluated -Multilayer Unna boot compression wrap was reapplied today to the bilateral lower extremities to address the heavy edema  - Today we were able to determine that the patient is possibly having allergic reaction to the doxycycline  creating itching and rash throughout the body.  Discontinue -Prescription for Augmentin 875/125 mg twice daily x 10 days -Return to clinic 1 week      Thresa EMERSON Sar, DPM [facility]  Dr. Thresa EMERSON Sar, DPM    [address] [place], [place] [address]                Office [phone]  Fax [phone]

## 2024-02-05 ENCOUNTER — Other Ambulatory Visit: Payer: Self-pay

## 2024-02-05 DIAGNOSIS — R52 Pain, unspecified: Secondary | ICD-10-CM | POA: Insufficient documentation

## 2024-02-05 DIAGNOSIS — I471 Supraventricular tachycardia, unspecified: Secondary | ICD-10-CM | POA: Insufficient documentation

## 2024-02-05 DIAGNOSIS — R531 Weakness: Secondary | ICD-10-CM | POA: Insufficient documentation

## 2024-02-05 DIAGNOSIS — D531 Other megaloblastic anemias, not elsewhere classified: Secondary | ICD-10-CM | POA: Insufficient documentation

## 2024-02-05 DIAGNOSIS — M6281 Muscle weakness (generalized): Secondary | ICD-10-CM | POA: Insufficient documentation

## 2024-02-05 DIAGNOSIS — Z659 Problem related to unspecified psychosocial circumstances: Secondary | ICD-10-CM | POA: Insufficient documentation

## 2024-02-05 DIAGNOSIS — N200 Calculus of kidney: Secondary | ICD-10-CM | POA: Insufficient documentation

## 2024-02-05 DIAGNOSIS — N179 Acute kidney failure, unspecified: Secondary | ICD-10-CM | POA: Insufficient documentation

## 2024-02-07 ENCOUNTER — Ambulatory Visit (INDEPENDENT_AMBULATORY_CARE_PROVIDER_SITE_OTHER): Admitting: Family Medicine

## 2024-02-07 ENCOUNTER — Encounter: Payer: Self-pay | Admitting: Family Medicine

## 2024-02-07 VITALS — BP 132/79 | HR 84 | Temp 98.8°F | Ht 72.0 in | Wt 174.1 lb

## 2024-02-07 DIAGNOSIS — A09 Infectious gastroenteritis and colitis, unspecified: Secondary | ICD-10-CM

## 2024-02-07 DIAGNOSIS — D519 Vitamin B12 deficiency anemia, unspecified: Secondary | ICD-10-CM

## 2024-02-07 DIAGNOSIS — R112 Nausea with vomiting, unspecified: Secondary | ICD-10-CM | POA: Diagnosis not present

## 2024-02-07 MED ORDER — ONDANSETRON HCL 4 MG PO TABS: 4.0000 mg | ORAL_TABLET | Freq: Three times a day (TID) | ORAL | 1 refills | Status: AC | PRN

## 2024-02-07 NOTE — Patient Instructions (Signed)
 Please call back and let us  know when colonoscopy has been rescheduled. Also, make sure an upper endoscopy is also scheduled.

## 2024-02-07 NOTE — Progress Notes (Signed)
 02/07/2024 Xavier Hebert [id number] May 28, 1945  Gastroenterology Office Note     Primary Care Physician:  Orlean Alan HERO, FNP  Primary GI Provider: Jinny Carmine, MD    Chief Complaint   Chief Complaint  Patient presents with   Follow-up     History of Present Illness   Xavier Hebert is a 78 y.o. male with PMHX of  PMHX of hypertension, hyperlipidemia, anxiety/depression, type 2 diabetes, and CKD presenting today for follow-up.  Patient was hospitalized last week at [facility] for worsening AKI with left hydroureteronephrosis.   Today, patient reports that his diarrhea has resolved.  He is now having soft formed stools daily.  Denies melena or hematochezia.  Reports he is having ongoing issues with his kidneys with his kidneys and is still passing kidney stones, having nausea due to kidney stones. He has an appointment with [facility] urology on 02/21/2024.     Patient reports he had to reschedule his colonoscopy/EGD due to other health issues, it has been rescheduled but he is unsure of date at this time.  Patient was seen 01/20/2024 in the emergency room for left flank pain.  Treated for lumbar sprain.   Patient last seen by myself on 12/27/2023 for anemia and diarrhea.  Stool studies positive for norovirus.  Intrinsic factor antibody elevated at 12.8.   [facility] [place] [facility].   Past Medical History:  Diagnosis Date   Acute kidney failure, unspecified 02/05/2024   Age-related nuclear cataract of both eyes 01/21/2022   Anxiety    Arthritis    Calculus of kidney 02/05/2024   Chronic kidney disease, stage 3a (HCC) 08/16/2022   Aug 16, 2022 Entered By: RONNETTE MAXWELL A Comment: b/l Cr ~2-2.2   Chronic pain syndrome    CKD (chronic kidney disease) stage 3, GFR 30-59 ml/min (HCC)    Depression    Diabetes mellitus without complication (HCC)    Type II   DVT (deep venous thrombosis) (HCC)     Left lower leg   Encounter for screening for eye and ear disorders 12/13/2023   Episodic tension-type headache, not intractable 08/06/2022   Exposure to potentially hazardous substance 08/16/2022   Jul 05, 2022 Entered By: ROSANA DRAGON Comment: Entered automatically through TES Problem List documentation program   GERD (gastroesophageal reflux disease)    Headache    Migraine- headache   Hiatal hernia    History of urinary stone 08/06/2022   Hyperlipidemia    Hypertension    Kidney stones    Lumbar spondylosis 01/02/2024   Muscle weakness (generalized) 02/05/2024   Neck stiffness    limited up and down motion   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, unspecified laterality 11/27/2019   Other abnormalities of gait and mobility 12/25/2023   Other megaloblastic anemias, 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   Pain, unspecified 02/05/2024   Peptic ulcer    Problem related to unspecified psychosocial circumstances 02/05/2024   Sleep apnea    does not use cpap/ pt states he lost 60#   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) 07/17/2019   Status post laser cataract surgery of both eyes 05/10/2022   Supraventricular tachycardia, unspecified 02/05/2024   Unspecified glaucoma 08/06/2022   Weakness 02/05/2024   Wears hearing aid in both ears    Has, does not wear    Past Surgical History:  Procedure Laterality Date   ANTERIOR CERVICAL DECOMP/DISCECTOMY  FUSION  2009   CARPAL TUNNEL RELEASE Left 01/03/2018   Procedure: CARPAL TUNNEL RELEASE;  Surgeon: Kathlynn Sharper, MD;  Location: [facility] ORS;  Service: Orthopedics;  Laterality: Left;   CARPAL TUNNEL RELEASE Right 07/17/2023   Procedure: CARPAL TUNNEL RELEASE;  Surgeon: Kathlynn Sharper, MD;  Location: [facility];  Service: Orthopedics;  Laterality: Right;   CARPOMETACARPAL (CMC) FUSION OF THUMB Left 08/10/2020   Procedure: CARPOMETACARPAL [facility]) FUSION OF THUMB;  Surgeon: Kathlynn Sharper, MD;   Location: [facility] ORS;  Service: Orthopedics;  Laterality: Left;   COLONOSCOPY WITH PROPOFOL  N/A 11/21/2017   Procedure: COLONOSCOPY WITH PROPOFOL ;  Surgeon: Toledo, Ladell POUR, MD;  Location: [facility] ENDOSCOPY;  Service: Gastroenterology;  Laterality: N/A;   CYSTOSCOPY     x3- 2 times for stone removal    ESOPHAGOGASTRODUODENOSCOPY N/A 10/14/2017   Procedure: ESOPHAGOGASTRODUODENOSCOPY (EGD);  Surgeon: Unk Corinn Skiff, MD;  Location: [facility] END[facility]COPY;  Service: Gastroenterology;  Laterality: N/A;   ESOPHAGOGASTRODUODENOSCOPY (EGD) WITH PROPOFOL  N/A 10/25/2017   Procedure: ESOPHAGOGASTRODUODENOSCOPY (EGD) WITH PROPOFOL ;  Surgeon: Unk Corinn Skiff, MD;  Location: [facility] ENDOSCOPY;  Service: Gastroenterology;  Laterality: N/A;   JOINT REPLACEMENT     bil. knees , shoulder rt, and neck    REVERSE SHOULDER ARTHROPLASTY Right 12/01/2014   REVERSE SHOULDER ARTHROPLASTY Right 12/01/2014   Procedure: REVERSE SHOULDER ARTHROPLASTY;  Surgeon: Glendia Cordella Hutchinson, MD;  [facility]: [facility] OR;  Service: Orthopedics;  Laterality: Right;   SHOULDER ARTHROSCOPY W/ ROTATOR CUFF REPAIR Right 06/10/2014   failed   SHOULDER OPEN ROTATOR CUFF REPAIR Left ~ 2008   TOTAL KNEE ARTHROPLASTY Bilateral 2005-2008   right-left   TRIGGER FINGER RELEASE Right 2001   TRIGGER FINGER RELEASE Left 08/10/2020   Procedure: RELEASE TRIGGER FINGER/A-1 PULLEY;  Surgeon: Kathlynn Sharper, MD;  Location: [facility] ORS;  Service: Orthopedics;  Laterality: Left;   ULNAR TUNNEL RELEASE Right 07/17/2023   Procedure: RELEASE, CUBITAL TUNNEL;  Surgeon: Kathlynn Sharper, MD;  Location: [facility];  Service: Orthopedics;  Laterality: Right;   URETERAL STENT PLACEMENT      Current Outpatient Medications  Medication Sig Dispense Refill   allopurinol  (ZYLOPRIM ) 100 MG tablet Take 100 mg by mouth daily.      amoxicillin -clavulanate (AUGMENTIN ) 875-125 MG tablet Take 1 tablet by mouth 2 (two) times daily. 20 tablet 0   apixaban (ELIQUIS) 5 MG TABS tablet  Take 5 mg by mouth.     butalbital -acetaminophen -caffeine  (FIORICET , ESGIC ) 50-325-40 MG per tablet Take 1-2 tablets by mouth See admin instructions. Take 2 tablets by mouth at start of headache and take 1 additional tablet by mouth after 2 hours if needed - max 3 tablets daily     cholecalciferol  (VITAMIN D ) 1000 units tablet Take 5,000 Units by mouth daily.     colchicine  0.6 MG tablet TAKE 1 TABLET BY MOUTH DAILY (Patient taking differently: Take 0.6 mg by mouth daily as needed.) 30 tablet 1   Continuous Glucose Receiver (FREESTYLE LIBRE 3 READER) DEVI See administration instructions.     diclofenac Sodium (VOLTAREN) 1 % GEL Apply 2 g topically.     doxycycline  (VIBRA -TABS) 100 MG tablet Take 1 tablet (100 mg total) by mouth 2 (two) times daily. 20 tablet 0   EC-RX Testosterone  20 % CREA Place 1 application  onto the skin in the morning and at bedtime. 60 g 2   empagliflozin  (JARDIANCE ) 25 MG TABS tablet Take 12.5 mg by mouth daily.     famotidine  (PEPCID ) 20 MG tablet Take 20 mg by  mouth.     flunisolide (NASALIDE) 25 MCG/ACT (0.025%) SOLN PLACE 1 SPRAY INTO EACH NOSTRIL TWICE DAILY 25 mL 4   hydrOXYzine  (ATARAX ) 50 MG tablet TAKE 1 TABLET BY MOUTH EVERY 4 TO 6 HOURS AS NEEDED 360 tablet 1   ketoconazole (NIZORAL) 2 % shampoo Apply 1 application topically once a week.      L-Tryptophan  500 MG CAPS Take 500 mg by mouth at bedtime.     latanoprost  (XALATAN ) 0.005 % ophthalmic solution Place 1 drop into both eyes at bedtime.     lidocaine  (LIDODERM ) 5 % Place 1 patch onto the skin every 12 (twelve) hours. Remove & Discard patch within 12 hours or as directed by MD 60 patch 6   [Paused] lisinopril  (ZESTRIL ) 20 MG tablet Take 20 mg by mouth daily.     magnesium  oxide (MAG-OX) 400 MG tablet Take 400 mg by mouth daily.     methocarbamol  (ROBAXIN ) 750 MG tablet TAKE 1 TABLET BY MOUTH 4 TIMES DAILY 360 tablet 1   Misc Natural Products (OSTEO BI-FLEX ADV TRIPLE ST) TABS Take 1 tablet by mouth in the  morning and at bedtime.     Multiple Vitamin (MULTIVITAMIN) tablet Take 1 tablet by mouth daily.     naloxone  (NARCAN ) nasal spray 4 mg/0.1 mL Place 1 spray into the nose.     pantoprazole  (PROTONIX ) 40 MG tablet Take 40 mg by mouth 2 (two) times daily.      rosuvastatin  (CRESTOR ) 20 MG tablet Take 10 mg by mouth at bedtime.      Saw Palmetto  450 MG CAPS Take 900 mg by mouth in the morning and at bedtime.     Semaglutide, 1 MG/DOSE, 4 MG/3ML SOPN Inject 1 mg into the skin once a week.     tamsulosin  (FLOMAX ) 0.4 MG CAPS capsule Take 0.4 mg by mouth daily.     timolol  (TIMOPTIC ) 0.5 % ophthalmic solution Place 1 drop into the left eye 2 (two) times daily.     triamcinolone  ointment (KENALOG ) 0.5 % Apply 1 Application topically 2 (two) times daily. 30 g 0   ondansetron  (ZOFRAN ) 4 MG tablet Take 1 tablet (4 mg total) by mouth every 8 (eight) hours as needed for nausea. 30 tablet 1   No current facility-administered medications for this visit.    Allergies as of 02/07/2024 - Review Complete 02/07/2024  Allergen Reaction Noted   Ivp dye [iodinated contrast media] Anaphylaxis and Other (See Comments) 11/06/2013   Quinine Shortness Of Breath, Rash, and Other (See Comments) 05/06/2002   Amlodipine  Swelling 10/12/2014   Aspirin Other (See Comments) 05/07/2003   Gabapentin  09/17/2014   Glipizide Other (See Comments) 12/02/2009   Hydrochlorothiazide Other (See Comments) 10/09/2014   Ibuprofen Other (See Comments) 05/07/2003   Ioxaglate Other (See Comments) 01/27/2015   Latex Itching 11/20/2014   Metformin and related Diarrhea 07/09/2023   Nsaids  08/02/2020   Omeprazole  Other (See Comments) 12/14/2003   Pregabalin Other (See Comments) 09/17/2014   Doxycycline  Anxiety 07/16/2019   Pioglitazone Diarrhea and Other (See Comments) 07/03/2010   Tape Rash 07/09/2023    Family History  Problem Relation Age of Onset   Dementia Mother    Alcohol abuse Father     Social History   Socioeconomic  History   Marital status: Married    Spouse name: Not on file   Number of children: Not on file   Years of education: Not on file   Highest education level: Not on file  Occupational History   Not on file  Tobacco Use   Smoking status: Never   Smokeless tobacco: Never  Vaping Use   Vaping status: Never Used  Substance and Sexual Activity   Alcohol use: Never   Drug use: No   Sexual activity: Not Currently  Other Topics Concern   Not on file  Social History Narrative   Lives at home with his wife. Independent at baseline.   Social Drivers of Corporate Investment Banker Strain: Low Risk  (05/31/2023)   Received from [facility]   Overall Financial Resource Strain (CARDIA)    Difficulty of Paying Living Expenses: Not hard at all  Food Insecurity: No Food Insecurity (05/31/2023)   Received from [facility]   Hunger Vital Sign    Within the past 12 months, you worried that your food would run out before you got the money to buy more.: Never true    Within the past 12 months, the food you bought just didn't last and you didn't have money to get more.: Never true  Transportation Needs: No Transportation Needs (05/31/2023)   Received from [facility] - Transportation    In the past 12 months, has lack of transportation kept you from medical appointments or from getting medications?: No    Lack of Transportation (Non-Medical): No  Physical Activity: Not on file  Stress: Not on file  Social Connections: Not on file  Intimate Partner Violence: Not on file     RELEVANT GI HISTORY, IMAGING AND LABS: CBC    Component Value Date/Time   WBC 5.9 01/20/2024 1442   RBC 3.22 (L) 01/20/2024 1442   HGB 10.4 (L) 01/20/2024 1442   HGB 10.2 (L) 12/27/2023 1553   HCT 32.0 (L) 01/20/2024 1442   HCT 30.9 (L) 12/27/2023 1553   PLT 189 01/20/2024 1442   PLT 159 12/27/2023 1553   MCV 99.4 01/20/2024 1442   MCV 99 (H) 12/27/2023  1553   MCH 32.3 01/20/2024 1442   MCHC 32.5 01/20/2024 1442   RDW 12.6 01/20/2024 1442   RDW 12.3 12/27/2023 1553   LYMPHSABS 0.9 12/27/2023 1553   MONOABS 0.3 10/25/2023 2237   EOSABS 0.4 12/27/2023 1553   BASOSABS 0.1 12/27/2023 1553   Recent Labs    04/03/23 1406 10/25/23 2237 10/26/23 0738 10/26/23 1453 10/26/23 1919 10/27/23 0332 11/07/23 1429 12/27/23 1553 01/20/24 1442  HGB 14.3 8.5* 8.0* 7.8* 8.1* 7.7* 8.8* 10.2* 10.4*    CMP     Component Value Date/Time   NA 138 01/20/2024 1442   NA 142 12/27/2023 1553   K 4.4 01/20/2024 1442   CL 102 01/20/2024 1442   CO2 24 01/20/2024 1442   GLUCOSE 138 (H) 01/20/2024 1442   BUN 35 (H) 01/20/2024 1442   BUN 46 (H) 12/27/2023 1553   CREATININE 2.12 (H) 01/20/2024 1442   CREATININE 1.54 (H) 01/22/2012 0817   CALCIUM  8.8 (L) 01/20/2024 1442   PROT 6.7 12/27/2023 1553   ALBUMIN  4.2 12/27/2023 1553   AST 13 12/27/2023 1553   ALT 18 12/27/2023 1553   ALKPHOS 115 12/27/2023 1553   BILITOT <0.2 12/27/2023 1553   GFRNONAA 32 (L) 01/20/2024 1442   GFRNONAA 47 (L) 01/22/2012 0817   GFRAA 45 (L) 10/14/2017 0553   GFRAA 54 (L) 01/22/2012 0817      Latest Ref Rng & Units 12/27/2023    3:53 PM 10/25/2023   10:37 PM 04/03/2023  2:06 PM  Hepatic Function  Total Protein 6.0 - 8.5 g/dL 6.7  6.4  6.6   Albumin  3.8 - 4.8 g/dL 4.2  3.7  4.4   AST 0 - 40 IU/L 13  17  18    ALT 0 - 44 IU/L 18  19  16    Alk Phosphatase 47 - 123 IU/L 115  83  100   Total Bilirubin 0.0 - 1.2 mg/dL <9.7  0.8  0.3       Review of Systems   All systems reviewed and negative except where noted in HPI.    Physical Exam  BP 132/79   Pulse 84   Temp 98.8 F (37.1 C) (Oral)   Ht 6' (1.829 m)   Wt 174 lb 2 oz (79 kg)   SpO2 100%   BMI 23.62 kg/m  No LMP for male patient. General:   Alert and oriented. Pleasant and cooperative. Well-nourished and well-developed.  Head:  Normocephalic and atraumatic. Eyes:  Without icterus Ears:  Normal auditory  acuity. Abdomen:  Normal bowel sounds.  No bruits.  Soft, non-tender and non-distended without masses, hepatosplenomegaly or hernias noted.  No guarding or rebound tenderness.    Rectal:  Deferred. Msk:  Symmetrical without gross deformities. Normal posture. Extremities: Bilateral extremities wrapped, unable to assess. Neurologic:  Alert and  oriented x4;  grossly normal neurologically. Skin:  Intact without significant lesions or rashes. Psych:  Alert and cooperative. Normal mood and affect.   Assessment & Plan   Xavier Hebert is a 78 y.o. male presenting today for follow-up on diarrhea and anemia.   Diarrhea due to norovirus.  Has now resolved.    Anemia. Hgb stable at 10.4. Intrinsic factor antibodies elevated at 12.8.  - Continue B12 supplementation - Waiting on colonoscopy and upper endoscopy at [facility] has kidney stones and has nausea when they are trying to pass.  - Sent in refill for Zofran  4 mg   Follow-up in 3 months  Grayce Bohr, DNP, AGNP-C [facility] Gastroenterology

## 2024-02-08 ENCOUNTER — Encounter: Payer: Self-pay | Admitting: Podiatry

## 2024-02-08 ENCOUNTER — Ambulatory Visit: Admitting: Podiatry

## 2024-02-08 VITALS — Ht 72.0 in | Wt 174.1 lb

## 2024-02-08 DIAGNOSIS — R600 Localized edema: Secondary | ICD-10-CM

## 2024-02-08 DIAGNOSIS — L03115 Cellulitis of right lower limb: Secondary | ICD-10-CM | POA: Diagnosis not present

## 2024-02-08 NOTE — Progress Notes (Unsigned)
 No chief complaint on file.   HPI: 78 y.o. male presenting today for recurrence of edema to the bilateral lower extremities with erythema consistent with cellulitis.  He does have a history of cellulitis with edema.  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  Past Medical History:  Diagnosis Date   Acute kidney failure, unspecified 02/05/2024   Age-related nuclear cataract of both eyes 01/21/2022   Anxiety    Arthritis    Calculus of kidney 02/05/2024   Chronic kidney disease, stage 3a (HCC) 08/16/2022   Aug 16, 2022 Entered By: RONNETTE MAXWELL A Comment: b/l Cr ~2-2.2   Chronic pain syndrome    CKD (chronic kidney disease) stage 3, GFR 30-59 ml/min (HCC)    Depression    Diabetes mellitus without complication (HCC)    Type II   DVT (deep venous thrombosis) (HCC)    Left lower leg   Encounter for screening for eye and ear disorders 12/13/2023   Episodic tension-type headache, not intractable 08/06/2022   Exposure to potentially hazardous substance 08/16/2022   Jul 05, 2022 Entered By: ROSANA DRAGON Comment: Entered automatically through TES Problem List documentation program   GERD (gastroesophageal reflux disease)    Headache    Migraine- headache   Hiatal hernia    History of urinary stone 08/06/2022   Hyperlipidemia    Hypertension    Kidney stones    Lumbar spondylosis 01/02/2024   Muscle weakness (generalized) 02/05/2024   Neck stiffness    limited up and down motion   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, unspecified laterality 11/27/2019   Other abnormalities of gait and mobility 12/25/2023   Other megaloblastic anemias, 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   Pain, unspecified 02/05/2024   Peptic ulcer    Problem related to unspecified psychosocial circumstances 02/05/2024   Sleep apnea    does not use cpap/ pt states he lost 60#   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) 07/17/2019    Status post laser cataract surgery of both eyes 05/10/2022   Supraventricular tachycardia, unspecified 02/05/2024   Unspecified glaucoma 08/06/2022   Weakness 02/05/2024   Wears hearing aid in both ears    Has, does not wear    Past Surgical History:  Procedure Laterality Date   ANTERIOR CERVICAL DECOMP/DISCECTOMY FUSION  2009   CARPAL TUNNEL RELEASE Left 01/03/2018   Procedure: CARPAL TUNNEL RELEASE;  Surgeon: Kathlynn Sharper, MD;  Location: [facility] ORS;  Service: Orthopedics;  Laterality: Left;   CARPAL TUNNEL RELEASE Right 07/17/2023   Procedure: CARPAL TUNNEL RELEASE;  Surgeon: Kathlynn Sharper, MD;  Location: [facility];  Service: Orthopedics;  Laterality: Right;   CARPOMETACARPAL (CMC) FUSION OF THUMB Left 08/10/2020   Procedure: CARPOMETACARPAL [facility]) FUSION OF THUMB;  Surgeon: Kathlynn Sharper, MD;  Location: [facility] ORS;  Service: Orthopedics;  Laterality: Left;   COLONOSCOPY WITH PROPOFOL  N/A 11/21/2017   Procedure: COLONOSCOPY WITH PROPOFOL ;  Surgeon: Toledo, Ladell POUR, MD;  Location: [facility] ENDOSCOPY;  Service: Gastroenterology;  Laterality: N/A;   CYSTOSCOPY     x3- 2 times for stone removal    ESOPHAGOGASTRODUODENOSCOPY N/A 10/14/2017   Procedure: ESOPHAGOGASTRODUODENOSCOPY (EGD);  Surgeon: Unk Corinn Skiff, MD;  Location: [facility] ENDOSCOPY;  Service: Gastroenterology;  Laterality: N/A;   ESOPHAGOGASTRODUODENOSCOPY (EGD) WITH PROPOFOL  N/A 10/25/2017   Procedure: ESOPHAGOGASTRODUODENOSCOPY (EGD) WITH PROPOFOL ;  Surgeon: Unk Corinn Skiff, MD;  Location: [facility] ENDOSCOPY;  Service: Gastroenterology;  Laterality: N/A;   JOINT REPLACEMENT  bil. knees , shoulder rt, and neck    REVERSE SHOULDER ARTHROPLASTY Right 12/01/2014   REVERSE SHOULDER ARTHROPLASTY Right 12/01/2014   Procedure: REVERSE SHOULDER ARTHROPLASTY;  Surgeon: Glendia Cordella Hutchinson, MD;  Location: [facility] OR;  Service: Orthopedics;  Laterality: Right;   SHOULDER ARTHROSCOPY W/ ROTATOR CUFF REPAIR Right 06/10/2014   failed    SHOULDER OPEN ROTATOR CUFF REPAIR Left ~ 2008   TOTAL KNEE ARTHROPLASTY Bilateral 2005-2008   right-left   TRIGGER FINGER RELEASE Right 2001   TRIGGER FINGER RELEASE Left 08/10/2020   Procedure: RELEASE TRIGGER FINGER/A-1 PULLEY;  Surgeon: Kathlynn Sharper, MD;  Location: [facility] ORS;  Service: Orthopedics;  Laterality: Left;   ULNAR TUNNEL RELEASE Right 07/17/2023   Procedure: RELEASE, CUBITAL TUNNEL;  Surgeon: Kathlynn Sharper, MD;  Location: [facility];  Service: Orthopedics;  Laterality: Right;   URETERAL STENT PLACEMENT      Allergies  Allergen Reactions   Ivp Dye [Iodinated Contrast Media] Anaphylaxis and Other (See Comments)    Breathing difficulty and chest pain   Quinine Shortness Of Breath, Rash and Other (See Comments)    Other reaction(s): SHORTNESS OF BREATH   Amlodipine  Swelling    Other reaction(s): Edema of lower extremity   Aspirin Other (See Comments)    Stomach pain   Gabapentin     Other reaction(s): Dizziness   Glipizide Other (See Comments)    Other reaction(s): Chest pain   Hydrochlorothiazide Other (See Comments)    Other reaction(s): Impotence   Ibuprofen Other (See Comments)    Stomach pain  Other Reaction(s): ITCHING,WATERING EYES   Ioxaglate Other (See Comments)    Breathing difficulty and chest pain   Latex Itching    Gloves   Metformin And Related Diarrhea   Nsaids     Kidneys   Omeprazole  Other (See Comments)    Other reaction(s): FAILED THERAPY   Pregabalin Other (See Comments)    Other reaction(s): Dizziness   Doxycycline  Anxiety   Pioglitazone Diarrhea and Other (See Comments)    Other reaction(s): Diarrhea, Abdominal pain   Tape Rash    Some bandages and wraps have started causing rashes.  Unsure if it is the adhesive or the bandage material    RT leg 01/18/2024  Physical Exam: General: The patient is alert and oriented x3 in no acute distress.  Dermatology: Superficial partial-thickness serous blister/bul noted to the anterior  aspect of the right leg with surrounding erythema and edema  Vascular: Chronic edema noted to the right lower extremity and to a lesser extent the left lower extremity with associated erythema consistent with cellulitis  Neurological: Grossly intact via light touch  Musculoskeletal Exam: No pedal deformities noted.  No crepitus to the leg.  Calf is soft and supple.  Assessment/Plan of Care: 1.  Edema bilateral lower extremities 2.  H/o DVT RLE 3.  Superficial partial-thickness serous blisters right anterior leg 4.  Cellulitis right leg  -Patient evaluated -Multilayer Unna boot compression wrap was reapplied today to the bilateral lower extremities to address the heavy edema  - Today we were able to determine that the patient is possibly having allergic reaction to the doxycycline  creating itching and rash throughout the body.  Discontinue -Prescription for Augmentin  875/125 mg twice daily x 10 days -Return to clinic 1 week      Thresa EMERSON Sar, DPM [facility]  Dr. Thresa EMERSON Sar, DPM    [address]  [place], [place] [address]                Office [phone]  Fax [phone]

## 2024-02-25 ENCOUNTER — Other Ambulatory Visit: Payer: Self-pay | Admitting: Family

## 2024-02-26 ENCOUNTER — Ambulatory Visit: Admitting: Podiatry

## 2024-03-04 ENCOUNTER — Ambulatory Visit: Admitting: Podiatry

## 2024-03-06 ENCOUNTER — Ambulatory Visit

## 2024-03-08 NOTE — Progress Notes (Signed)
 HPI  SUBJECTIVE:   History of Present Illness Xavier Hebert is a 78 year old male who presents with symptoms of a urinary tract infection for the past 3 days.  He has had dysuria, urinary frequency, nocturia, urgency, and incontinence due to being unable to make it to the bathroom in time, starting soon after discharge from the hospital. He has not had had fever above 100.27F since onset. He notes cloudy light yellow urine.  He was recently discharged from the [facility] where he was treated for left kidney failure tachycardia, nonobstructive nephrolithiasis accompanied with severe pain.  He denies having a Foley during his admission.  He received antibiotics but is not sure what they were and was found to be allergic to penicillin. He was treated for tachycardia with medications but had adverse reactions to the first two.  He has BPH and takes takes Flomax  for this.  He is on Eliquis for tachycardia, recently reduced to 2.5 mg. 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  Additional medical history includes chronic kidney disease stage III, GERD, DVT, gout, GI bleed, UTI well-controlled diabetes, coronary artery disease, and an unknown prostate surgery.  [facility].    Past Medical History:  Diagnosis Date   Chronic pain syndrome    Coronary artery disease    Diabetes mellitus type 2, uncomplicated (CMS/HHS-HCC)    Hyperlipidemia    Hypertension    Migraines    Sleep apnea     Past Surgical History:  Procedure Laterality Date   INCISION TENDON SHEATH FOR TRIGGER FINGER Right 2001   KNEE ARTHROSCOPY  05/07/2003   Right knee   Microdiskectomy  02/16/2004   L4-5   Neck fusion  2009   COLONOSCOPY  11/21/2017   Tubular adenoma of the colon/Hyperplastic colon polyp/Repeat 58yrs/TKT   Left Carpal Tunnel Release 01/03/18 Left 01/03/2018   Ozell Flake, MD   Carpometacarpal Repair Left 08/10/2020   [facility]   Trigger Finger  Release Left 08/10/2020   Small finger   EGD  11/16/2020   Reflux esophagitis/Hyperkeratosis/Parakeratosis/No Repeat/TKT   EXTRACTION CATARACT INTRACAPSULAR W/INSERTION INTRAOCULAR PROSTHESIS Left 04/18/2022   Procedure: LENSX/ Panoptix----EXTRACTION CATARACT EXTRACAPSULAR ECCE WITH INSERTION INTRAOCULAR PROSTHESIS;  Surgeon: Damita Victory Feller, MD;  [facility];  Service: Ophthalmology;  Laterality: Left;   INSJ RX ELUTING IMPLT PUNCTAL DILAT LAC CANAL EA  Left 04/18/2022   Procedure: DEXTENZA ----Insertion of drug-eluting implant, including punctal dilation when performed, into lacrimal canaliculus, each;  Surgeon: Damita Victory Feller, MD;  [facility];  Service: Ophthalmology;  Laterality: Left;   EXTRACTION CATARACT INTRACAPSULAR W/INSERTION INTRAOCULAR PROSTHESIS Right 05/09/2022   Procedure: LENSX/ Panoptix----EXTRACTION CATARACT EXTRACAPSULAR ECCE WITH INSERTION INTRAOCULAR PROSTHESIS;  Surgeon: Damita Victory Feller, MD;  [facility];  Service: Ophthalmology;  Laterality: Right;   INSJ RX ELUTING IMPLT PUNCTAL DILAT LAC CANAL EA  Right 05/09/2022   Procedure: DEXTENZA ----Insertion of drug-eluting implant, including punctal dilation when performed, into lacrimal canaliculus, each;  Surgeon: Damita Victory Feller, MD;  [facility];  Service: Ophthalmology;  Laterality: Right;   Carpal Tunnel Release Surgery Right 07/17/2023   Dr. Ozell Flake   Cubital Tunnel Release surgery Right 07/17/2023   Dr. Ozell Flake   ARTHROSCOPIC ROTATOR CUFF REPAIR     KNEE ARTHROSCOPY  2005; 2006; 2008   Left knee/revisions    Family History  Problem Relation Name Age of Onset   Alzheimer's disease Mother  Myocardial Infarction (Heart attack) Father     Fibromyalgia Sister     Stroke Brother      Social History   Tobacco Use   Smoking status: Never   Smokeless tobacco: Never  Vaping Use   Vaping status: Never Used   Substance Use Topics   Alcohol use: No    Alcohol/week: 0.0 standard drinks of alcohol   Drug use: Never     Current Outpatient Medications:    acetaminophen  (TYLENOL ) 325 MG tablet, Take 650 mg by mouth every 6 (six) hours as needed for Pain, Disp: , Rfl:    allopurinol  (ZYLOPRIM ) 100 MG tablet, Take 100 mg by mouth once daily  , Disp: , Rfl:    ammonium lactate (LAC-HYDRIN) 12 % lotion, Apply topically as directed, Disp: , Rfl:    apixaban (ELIQUIS) 5 mg tablet, Take 5 mg by mouth 2 (two) times daily, Disp: , Rfl:    BD ULTRA-FINE NANO PEN NEEDLE 32 gauge x 5/32 Ndle, , Disp: , Rfl:    blood-glucose sensor (FREESTYLE LIBRE 3 PLUS SENSOR) Devi, PLACE 1 SENSOR TOPICALLY EVERY 15 DAYS AS DIRECTED, Disp: , Rfl:    blood-glucose,receiver,cont (FREESTYLE LIBRE 3 READER) Misc, as directed, Disp: , Rfl:    butalbital -acetaminophen -caffeine  (FIORICET ) 50-325-40 mg tablet, Take 1 tablet by mouth every 4 (four) hours as needed for Pain., Disp: , Rfl:    cephalexin  (KEFLEX ) 500 MG capsule, Take 500 mg by mouth 3 (three) times daily, Disp: , Rfl:    cholecalciferol  (VITAMIN D3) 1000 unit tablet, Take 2 tablets by mouth once daily, Disp: , Rfl:    clobetasol (TEMOVATE) 0.05 % ointment, Apply topically 2 (two) times daily., Disp: , Rfl:    coconut oil 1,000 mg Cap, Take 1,000 mg by mouth once daily  , Disp: , Rfl:    colchicine  (COLCRYS ) 0.6 mg tablet, Take 0.6 mg by mouth once daily as needed  , Disp: , Rfl:    docusate (COLACE) 100 MG capsule, Take 100 mg by mouth 4 (four) times daily, Disp: , Rfl:    doxepin (SINEQUAN) 50 MG capsule, Take 50 mg by mouth at bedtime, Disp: , Rfl:    empagliflozin  (JARDIANCE ) 25 mg tablet, Take 12.5 mg by mouth once daily, Disp: , Rfl:    eplerenone (INSPRA) 25 MG tablet, Take 25 mg by mouth once daily, Disp: , Rfl:    flash glucose sensor (FREESTYLE LIBRE 14 DAY SENSOR) kit, Use 1 kit every 14 (fourteen) days, Disp: 2 kit, Rfl: 5   flunisolide  (NASALIDE) 25 mcg (0.025 %) nasal spray, , Disp: , Rfl:    fluticasone  propionate (FLONASE ) 50 mcg/actuation nasal spray, Place 2 sprays into both nostrils once daily, Disp: , Rfl:    glucosam/chon-msm1/C/mang/bosw (OSTEO BI-FLEX TRIPLE STRENGTH ORAL), Take by mouth, Disp: , Rfl:    hydrocortisone 2.5 % cream, Apply topically once daily., Disp: , Rfl:    hydrOXYzine  (ATARAX ) 50 MG tablet, Take 50 mg by mouth 4 (four) times daily as needed  , Disp: , Rfl:    ketoconazole (NIZORAL) 2 % shampoo, , Disp: , Rfl:    latanoprost  (XALATAN ) 0.005 % ophthalmic solution, Place 1 drop into both eyes at bedtime, Disp: 2.5 mL, Rfl: 11   lidocaine  (LIDODERM ) 5 % patch, Place 1 patch onto the skin daily Apply patch to the most painful area for up to 12 hours in a 24 hour period., Disp: , Rfl:    lisinopril  (PRINIVIL ,ZESTRIL ) 40 MG tablet, Take 40 mg  by mouth once daily   , Disp: , Rfl:    magnesium  oxide (MAG-OX) 400 mg (241.3 mg magnesium ) tablet, Take 400 mg by mouth once daily, Disp: , Rfl:    methocarbamol  (ROBAXIN ) 750 MG tablet, Take 750 mg by mouth 4 (four) times daily, Disp: , Rfl:    MULTIVITAMIN ORAL, Take 1 tablet by mouth once daily., Disp: , Rfl:    mupirocin  (BACTROBAN ) 2 % ointment, , Disp: , Rfl:    naloxone  (NARCAN ) 4 mg/actuation nasal spray, Place into one nostril, Disp: , Rfl:    oxyCODONE  (ROXICODONE ) 15 MG immediate release tablet, Take 15 mg by mouth every 6 (six) hours as needed for Pain., Disp: , Rfl:    oxyCODONE  (ROXICODONE ) 15 MG immediate release tablet, Take 15 mg by mouth, Disp: , Rfl:    pantoprazole  (PROTONIX ) 20 MG DR tablet, Take 20 mg by mouth once daily, Disp: , Rfl:    rosuvastatin  (CRESTOR ) 10 MG tablet, Take 1 tablet (10 mg total) by mouth once daily, Disp: 30 tablet, Rfl: 11   rosuvastatin  (CRESTOR ) 20 MG tablet, Take 10 mg by mouth at bedtime FOR HIGH CHOLESTEROL, Disp: , Rfl:    saw palmetto  80 MG capsule, Take 80 mg by mouth once daily., Disp: , Rfl:     semaglutide (OZEMPIC) 0.25 mg or 0.5 mg(2 mg/1.5 mL) pen injector, Inject subcutaneously as directed, Disp: , Rfl:    sennosides-docusate (SENOKOT-S) 8.6-50 mg tablet, Take 2 tablets by mouth 2 (two) times daily as needed, Disp: , Rfl:    sennosides-docusate (SENOKOT-S) 8.6-50 mg tablet, Take 2 tablets by mouth 2 (two) times daily, Disp: , Rfl:    tamsulosin  (FLOMAX ) 0.4 mg capsule, Take 0.4 mg by mouth once daily, Disp: , Rfl:    timoloL  maleate (TIMOPTIC ) 0.5 % ophthalmic solution, Place 1 drop into the left eye 2 (two) times daily, Disp: 10 mL, Rfl: 11   triamcinolone  0.025 % cream, Apply topically 2 (two) times daily, Disp: , Rfl:    TRUEPLUS PEN NEEDLE 31 gauge x 5/16 needle, , Disp: , Rfl:    tryptophan  500 mg Cap, Take 500 mg by mouth daily  , Disp: , Rfl:    doxycycline  (VIBRA -TABS) 100 MG tablet, Take 100 mg by mouth 2 (two) times daily, Disp: , Rfl:    levoFLOXacin (LEVAQUIN) 250 MG tablet, Take 1 tablet (250 mg total) by mouth once daily for 4 days, Disp: 4 tablet, Rfl: 0   miconazole 2 % cream, Apply topically 2 (two) times daily for 7 days, Disp: 28 g, Rfl: 0   NUCYNTA ER 100 mg Tb12, Take 1 tablet by mouth once daily (Patient not taking: Reported on 07/04/2023), Disp: , Rfl:    sildenafil, antihypertensive, (REVATIO) 20 mg tablet, Take 20 mg by mouth 3 (three) times daily, Disp: , Rfl:    testosterone  30 mg/actuation (1.5 mL) SlPm, 1.5 mLs 2 (two) times daily, Disp: , Rfl:   Allergies  Allergen Reactions   Iodine And Iodide Containing Products Anaphylaxis and Other (See Comments)    Breathing difficulty and chest pain   Nsaids (Non-Steroidal Anti-Inflammatory Drug) Abdominal Pain    Other reaction(s): Abdominal pain Other reaction(s): GI Intolerance Other reaction(s): Abdominal pain Kidneys   Amlodipine  Other (See Comments)   Aspirin Unknown and Other (See Comments)    Other reaction(s): NAUSEA,VOMITING Other reaction(s): Other (See Comments) Stomach  pain    Gabapentin Dizziness   Glipizide Other (See Comments)    Other reaction(s): Chest pain  Hydrochlorothiazide Other (See Comments)    Other reaction(s): Impotence   Ibuprofen Other (See Comments)    Stomach pain Other reaction(s): GI Intolerance, ITCHING,WATERING EYES Stomach pain    Iodinated Contrast Media Rash   Ioxaglic Acid Other (See Comments)    Breathing difficulty and chest pain Other reaction(s): Other (See Comments) Breathing difficulty and chest pain Breathing difficulty and chest pain Breathing difficulty and chest pain   Latex Itching   Latex, Natural Rubber Itching   Metformin Diarrhea   Metrizamide Other (See Comments)    Breathing difficulty and chest pain   Omeprazole  Other (See Comments)    Other reaction(s): FAILED THERAPY   Other Anxiety and Other (See Comments)   Pregabalin Dizziness and Other (See Comments)    Other reaction(s): Dizziness Other reaction(s): Dizziness   Quinine Other (See Comments)    Other reaction(s): SHORTNESS OF BREATH   Quinine Sulfate Other (See Comments)    Other reaction(s): SHORTNESS OF BREATH   Doxycycline  Anxiety   Pioglitazone Abdominal Pain, Diarrhea and Other (See Comments)    Other reaction(s): Diarrhea, Abdominal pain Other reaction(s): Diarrhea, Abdominal pain     ROS  As noted in HPI.   Physical Exam  BP 130/79   Pulse 80   Temp 36.7 C (98.1 F) (Oral)   Ht 182.9 cm (6')   Wt 82.5 kg (181 lb 12.8 oz)   BMI 24.66 kg/m   Constitutional: Well developed, well nourished, no acute distress Eyes:  EOMI, conjunctiva normal bilaterally HENT: Normocephalic, atraumatic,mucus membranes moist Respiratory: Normal inspiratory effort Cardiovascular: Normal rate GI: nondistended soft, nontender.  No suprapubic, flank tenderness. Back: Positive mild bilateral CVAT GU: Normal uncircumcised male.  Glans irritated, no discharge.  No evidence of obstruction.  Testes descended bilaterally.  No  perineal tenderness.  Perineum normal appearance, no erythema, induration.  Chaperone CMA Danika and spouse present during exam Rectal: Normal rectal tone.  Prostate normal size, firm, nontender. Chaperone CMA Danika and spouse present during exam skin: See GU exam  Musculoskeletal: no deformities Neurologic: Alert & oriented x 3, no focal neuro deficits Psychiatric: Speech and behavior appropriate   Walk-In Course    Orders Placed This Encounter  Procedures   Urine Culture, Routine - [facility]    Standing Status:   Future    Number of Occurrences:   1    Expected Date:   03/08/2024    Expiration Date:   03/08/2025    Release to patient:   Immediate   Urinalysis w/Microscopic    Standing Status:   Future    Number of Occurrences:   1    Expected Date:   03/08/2024    Expiration Date:   06/06/2024    Release to patient:   Immediate    Results for orders placed or performed in visit on 03/08/24  Urine Culture, Routine - [facility]   Specimen: Urine, Clean Catch  Result Value Ref Range   Urine Culture, Routine - [facility] Final report    Result 1 - [facility] No growth    Narrative   Performed at:  [address] [employer] [address] [place], [place]  [id number] Lab Director: Frankey Sas MD, Phone:  [phone]  Urinalysis w/Microscopic  Result Value Ref Range   Color Yellow Colorless, Straw, Light Yellow, Yellow, Dark Yellow   Clarity Clear Clear   Specific Gravity 1.025 1.000 - 1.030   pH, Urine 5.5 5.0 - 8.0   Protein, Urinalysis 100 (!) Negative, Trace mg/dL   Glucose,  Urinalysis Negative Negative mg/dL   Ketones, Urinalysis Negative Negative mg/dL   Blood, Urinalysis Small (!) Negative   Nitrite, Urinalysis Negative Negative   Leukocyte Esterase, Urinalysis Negative Negative   White Blood Cells, Urinalysis 4-10 (!) None Seen, 0-3 /hpf   Red Blood Cells, Urinalysis None Seen None Seen, 0-3 /hpf   Bacteria, Urinalysis Rare (!) None Seen /hpf   Squamous Epithelial  Cells, Urinalysis Rare Rare, Few, None Seen /hpf     Walk-In Clinical Impression  1. Complicated urinary tract infection   2. Dysuria   3. Abnormal finding on urinalysis      Walk-In Assessment/Plan  Patient consented to the use of AI during this encounter.  Outside labs reviewed.  Calculated creatinine clearance done in November 2025 35 mL/min.  No urine cultures available here or in Care Everywhere.  Allergic to doxycycline  causes anxiety, and states that he was told that he was allergic to penicillin while he was recently in the hospital but does not remember his reaction.  Otherwise has no other antibiotic allergies  UA positive protein, hematuria, WBCs and rare bacteria.  Sending off for culture to confirm diagnosis and antibiotic choice.  Presentation consistent with a complicated UTI and balanitis.  He has no pain consistent with obstructing nephrolithiasis.  No evidence of prostatitis on exam at this time.  Will send home on renally dosed Levaquin 500 mg for initial dose then 250 mg every 24 hours for 5 days per up-to-date recommendations.  Cannot do Bactrim because he is on an ACE inhibitor and another medication that can cause hyperkalemia.    Miconazole cream for the balanitis.  Unfortunately I cannot prescribe Pyridium because of his kidney function.  Advised him to contact his urologist and let him know that he has a UTI.  Urine culture reviewed.  No growth.  However, I will continue the Levaquin as he may have a prostatitis with his urinary symptoms, even though his prostate was nontender.  He is to follow-up with his urologist ASAP to see if he needs additional antibiotic therapy.  MyChart note sent to patient.  Discussed labs, , MDM, treatment plan, and plan for follow-up with patient. Discussed sn/sx that should prompt return to the ED. patient agrees with plan.   Requested Prescriptions   Signed Prescriptions Disp Refills   miconazole 2 % cream 28 g 0    Sig: Apply  topically 2 (two) times daily for 7 days   levoFLOXacin (LEVAQUIN) 500 MG tablet 1 tablet 0    Sig: Take 1 tablet (500 mg total) by mouth once for 1 dose   levoFLOXacin (LEVAQUIN) 250 MG tablet 4 tablet 0    Sig: Take 1 tablet (250 mg total) by mouth once daily for 4 days      *This clinic note was created using Scientist, clinical (histocompatibility and immunogenetics). Therefore, there may be occasional mistakes despite careful proofreading.  @ESIG @  Rosina Shirts, M.D.

## 2024-03-26 ENCOUNTER — Ambulatory Visit: Admitting: Family

## 2024-04-01 ENCOUNTER — Ambulatory Visit: Admitting: Family

## 2024-04-01 ENCOUNTER — Encounter: Payer: Self-pay | Admitting: Family

## 2024-04-01 VITALS — BP 110/60 | HR 63 | Ht 72.0 in | Wt 172.4 lb

## 2024-04-01 DIAGNOSIS — I10 Essential (primary) hypertension: Secondary | ICD-10-CM

## 2024-04-01 DIAGNOSIS — I482 Chronic atrial fibrillation, unspecified: Secondary | ICD-10-CM | POA: Diagnosis not present

## 2024-04-01 DIAGNOSIS — Z794 Long term (current) use of insulin: Secondary | ICD-10-CM | POA: Diagnosis not present

## 2024-04-01 DIAGNOSIS — G894 Chronic pain syndrome: Secondary | ICD-10-CM

## 2024-04-01 DIAGNOSIS — E1122 Type 2 diabetes mellitus with diabetic chronic kidney disease: Secondary | ICD-10-CM

## 2024-04-01 DIAGNOSIS — D61818 Other pancytopenia: Secondary | ICD-10-CM

## 2024-04-01 DIAGNOSIS — E782 Mixed hyperlipidemia: Secondary | ICD-10-CM | POA: Diagnosis not present

## 2024-04-01 DIAGNOSIS — N184 Chronic kidney disease, stage 4 (severe): Secondary | ICD-10-CM

## 2024-04-01 DIAGNOSIS — E538 Deficiency of other specified B group vitamins: Secondary | ICD-10-CM | POA: Diagnosis not present

## 2024-04-01 DIAGNOSIS — L97912 Non-pressure chronic ulcer of unspecified part of right lower leg with fat layer exposed: Secondary | ICD-10-CM | POA: Insufficient documentation

## 2024-04-01 MED ORDER — CYANOCOBALAMIN 1000 MCG/ML IJ SOLN
1000.0000 ug | Freq: Once | INTRAMUSCULAR | Status: AC
  Administered 2024-04-01: 1000 ug via INTRAMUSCULAR

## 2024-04-01 NOTE — Assessment & Plan Note (Signed)
 Blood pressure well controlled with current medications.  Continue current therapy.  Will reassess at follow up.

## 2024-04-01 NOTE — Assessment & Plan Note (Signed)
 B12 injection given in office today.  Return for next injection per provider instructions.

## 2024-04-01 NOTE — Assessment & Plan Note (Signed)
 Patient is seen by cardiology, who manage this condition.  He is well controlled with current therapy.   Will defer to them for further changes to plan of care.

## 2024-04-01 NOTE — Assessment & Plan Note (Signed)
 Continue current diabetes POC, as patient has been well controlled on current regimen.  Will adjust meds if needed based on labs.

## 2024-04-01 NOTE — Progress Notes (Signed)
 "  Established Patient Office Visit  Subjective:  Patient ID: Xavier Hebert, male    DOB: Jan 30, 1946  Age: 79 y.o. MRN: [id number]  Chief Complaint  Patient presents with   Follow-up    3 month follow up    Patient is here today for his 3 months follow up.  He has been feeling poorly since last appointment.   He does have additional concerns to discuss today.  He needs a B12 injection today.  Also has been hospitalized since his last visit with me, had sepsis.   Labs were done prior to appointment, will discuss in detail today.  He needs refills.   I have reviewed his active problem list, medication list, allergies, notes from last encounter, lab results for his appointment today.      No other concerns at this time.   Past Medical History:  Diagnosis Date   Acute kidney failure, unspecified 02/05/2024   Age-related nuclear cataract of both eyes 01/21/2022   Anxiety    Arthritis    Calculus of kidney 02/05/2024   Chronic kidney disease, stage 3a (HCC) 08/16/2022   Aug 16, 2022 Entered By: RONNETTE MAXWELL A Comment: b/l Cr ~2-2.2   Chronic pain syndrome    CKD (chronic kidney disease) stage 3, GFR 30-59 ml/min (HCC)    Depression    Diabetes mellitus without complication (HCC)    Type II   DVT (deep venous thrombosis) (HCC)    Left lower leg   Encounter for screening for eye and ear disorders 12/13/2023   Episodic tension-type headache, not intractable 08/06/2022   Exposure to potentially hazardous substance 08/16/2022   Jul 05, 2022 Entered By: ROSANA DRAGON Comment: Entered automatically through [facility] Problem List documentation program   GERD (gastroesophageal reflux disease)    Headache    Migraine- headache   Hiatal hernia    History of urinary stone 08/06/2022   Hyperlipidemia    Hypertension    Kidney stones    Lumbar spondylosis 01/02/2024   Muscle weakness (generalized) 02/05/2024   Neck stiffness    limited up and down motion   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, unspecified laterality 11/27/2019   Other abnormalities of gait and mobility 12/25/2023   Other megaloblastic anemias, 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   Pain, unspecified 02/05/2024   Peptic ulcer    Problem related to unspecified psychosocial circumstances 02/05/2024   Sleep apnea    does not use cpap/ pt states he lost 60#   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) 07/17/2019   Status post laser cataract surgery of both eyes 05/10/2022   Supraventricular tachycardia, unspecified 02/05/2024   Unspecified glaucoma 08/06/2022   Weakness 02/05/2024   Wears hearing aid in both ears    Has, does not wear    Past Surgical History:  Procedure Laterality Date   ANTERIOR CERVICAL DECOMP/DISCECTOMY FUSION  2009   CARPAL TUNNEL RELEASE Left 01/03/2018   Procedure: CARPAL TUNNEL RELEASE;  Surgeon: Kathlynn Sharper, MD;  Location: [facility];  Service: Orthopedics;  Laterality: Left;   CARPAL TUNNEL RELEASE Right 07/17/2023   Procedure: CARPAL TUNNEL RELEASE;  Surgeon: Kathlynn Sharper, MD;  Location: [facility];  Service: Orthopedics;  Laterality: Right;   CARPOMETACARPAL (CMC) FUSION OF THUMB Left 08/10/2020   Procedure: CARPOMETACARPAL [facility]) FUSION OF THUMB;  Surgeon: Kathlynn Sharper, MD;  Location: [facility];  Service: Orthopedics;  Laterality: Left;   COLONOSCOPY WITH PROPOFOL  N/A 11/21/2017   Procedure: COLONOSCOPY  WITH PROPOFOL ;  Surgeon: Toledo, Ladell POUR, MD;  Location: [facility];  Service: Gastroenterology;  Laterality: N/A;   CYSTOSCOPY     x3- 2 times for stone removal    ESOPHAGOGASTRODUODENOSCOPY N/A 10/14/2017   Procedure: ESOPHAGOGASTRODUODENOSCOPY (EGD);  Surgeon: Unk Corinn Skiff, MD;  Location: [facility];  Service: Gastroenterology;  Laterality: N/A;   ESOPHAGOGASTRODUODENOSCOPY (EGD) WITH PROPOFOL  N/A 10/25/2017   Procedure: ESOPHAGOGASTRODUODENOSCOPY (EGD) WITH PROPOFOL ;  Surgeon: Unk Corinn Skiff, MD;   Location: [facility];  Service: Gastroenterology;  Laterality: N/A;   JOINT REPLACEMENT     bil. knees , shoulder rt, and neck    REVERSE SHOULDER ARTHROPLASTY Right 12/01/2014   REVERSE SHOULDER ARTHROPLASTY Right 12/01/2014   Procedure: REVERSE SHOULDER ARTHROPLASTY;  Surgeon: Glendia Cordella Hutchinson, MD;  Location: [facility] OR;  Service: Orthopedics;  Laterality: Right;   SHOULDER ARTHROSCOPY W/ ROTATOR CUFF REPAIR Right 06/10/2014   failed   SHOULDER OPEN ROTATOR CUFF REPAIR Left ~ 2008   TOTAL KNEE ARTHROPLASTY Bilateral 2005-2008   right-left   TRIGGER FINGER RELEASE Right 2001   TRIGGER FINGER RELEASE Left 08/10/2020   Procedure: RELEASE TRIGGER FINGER/A-1 PULLEY;  Surgeon: Kathlynn Sharper, MD;  Location: [facility];  Service: Orthopedics;  Laterality: Left;   ULNAR TUNNEL RELEASE Right 07/17/2023   Procedure: RELEASE, CUBITAL TUNNEL;  Surgeon: Kathlynn Sharper, MD;  Location: [facility];  Service: Orthopedics;  Laterality: Right;   URETERAL STENT PLACEMENT      Social History   Socioeconomic History   Marital status: Married    Spouse name: Not on file   Number of children: Not on file   Years of education: Not on file   Highest education level: Not on file  Occupational History   Not on file  Tobacco Use   Smoking status: Never   Smokeless tobacco: Never  Vaping Use   Vaping status: Never Used  Substance and Sexual Activity   Alcohol use: Never   Drug use: No   Sexual activity: Not Currently  Other Topics Concern   Not on file  Social History Narrative   Lives at home with his wife. Independent at baseline.   Social Drivers of Health   Tobacco Use: Low Risk (04/01/2024)   Patient History    Smoking Tobacco Use: Never    Smokeless Tobacco Use: Never    Passive Exposure: Not on file  Financial Resource Strain: Low Risk  (03/08/2024)   Received from [facility]   Overall Financial Resource Strain (CARDIA)    Difficulty of Paying Living Expenses:  Not hard at all  Food Insecurity: No Food Insecurity (03/08/2024)   Received from [facility]   Epic    Within the past 12 months, you worried that your food would run out before you got the money to buy more.: Never true    Within the past 12 months, the food you bought just didn't last and you didn't have money to get more.: Never true  Transportation Needs: No Transportation Needs (03/08/2024)   Received from [facility] - Transportation    In the past 12 months, has lack of transportation kept you from medical appointments or from getting medications?: No    Lack of Transportation (Non-Medical): No  Physical Activity: Not on file  Stress: Not on file  Social Connections: Not on file  Intimate Partner Violence: Not on file  Depression (PHQ2-9): Low Risk (04/03/2023)   Depression (PHQ2-9)    PHQ-2  Score: 2  Alcohol Screen: Not on file  Housing: Low Risk  (03/08/2024)   Received from [facility]   Epic    In the last 12 months, was there a time when you were not able to pay the mortgage or rent on time?: No    In the past 12 months, how many times have you moved where you were living?: 0    At any time in the past 12 months, were you homeless or living in a shelter (including now)?: No  Utilities: Not At Risk (03/08/2024)   Received from [facility]   Epic    In the past 12 months has the electric, gas, oil, or water company threatened to shut off services in your home?: No  Health Literacy: Not on file    Family History  Problem Relation Age of Onset   Dementia Mother    Alcohol abuse Father     Allergies[1]  Review of Systems  All other systems reviewed and are negative.      Objective:   BP 110/60   Pulse 63   Ht 6' (1.829 m)   Wt 172 lb 6.4 oz (78.2 kg)   SpO2 99%   BMI 23.38 kg/m   Vitals:   04/01/24 1332  BP: 110/60  Pulse: 63  Height: 6' (1.829 m)  Weight: 172 lb 6.4  oz (78.2 kg)  SpO2: 99%  BMI (Calculated): 23.38    Physical Exam Vitals and nursing note reviewed.  Constitutional:      Appearance: Normal appearance. He is normal weight.  Eyes:     Pupils: Pupils are equal, round, and reactive to light.  Cardiovascular:     Rate and Rhythm: Normal rate and regular rhythm.     Pulses: Normal pulses.     Heart sounds: Normal heart sounds.  Pulmonary:     Effort: Pulmonary effort is normal.     Breath sounds: Normal breath sounds.  Neurological:     General: No focal deficit present.     Mental Status: He is alert and oriented to person, place, and time.  Psychiatric:        Mood and Affect: Mood normal.        Behavior: Behavior normal.        Thought Content: Thought content normal.        Judgment: Judgment normal.      No results found for any visits on 04/01/24.  Recent Results (from the past 2160 hours)  Urinalysis, Routine w reflex microscopic -Urine, Clean Catch     Status: Abnormal   Collection Time: 01/20/24  2:42 PM  Result Value Ref Range   Color, Urine YELLOW (A) YELLOW   APPearance CLEAR (A) CLEAR   Specific Gravity, Urine 1.018 1.005 - 1.030   pH 5.0 5.0 - 8.0   Glucose, UA >=500 (A) NEGATIVE mg/dL   Hgb urine dipstick LARGE (A) NEGATIVE   Bilirubin Urine NEGATIVE NEGATIVE   Ketones, ur NEGATIVE NEGATIVE mg/dL   Protein, ur 30 (A) NEGATIVE mg/dL   Nitrite NEGATIVE NEGATIVE   Leukocytes,Ua NEGATIVE NEGATIVE   RBC / HPF >50 0 - 5 RBC/hpf   WBC, UA 0-5 0 - 5 WBC/hpf   Bacteria, UA NONE SEEN NONE SEEN   Squamous Epithelial / HPF 0 0 - 5 /HPF   Mucus PRESENT     Comment: Performed at [facility], [address], [place], [place] [address]  Basic metabolic panel  Status: Abnormal   Collection Time: 01/20/24  2:42 PM  Result Value Ref Range   Sodium 138 135 - 145 mmol/L   Potassium 4.4 3.5 - 5.1 mmol/L   Chloride 102 98 - 111 mmol/L   CO2 24 22 - 32 mmol/L   Glucose, Bld 138 (H) 70 - 99 mg/dL     Comment: Glucose reference range applies only to samples taken after fasting for at least 8 hours.   BUN 35 (H) 8 - 23 mg/dL   Creatinine, Ser 7.87 (H) 0.61 - 1.24 mg/dL   Calcium  8.8 (L) 8.9 - 10.3 mg/dL   GFR, Estimated 32 (L) >60 mL/min    Comment: (NOTE) Calculated using the CKD-EPI Creatinine Equation (2021)    Anion gap 12 5 - 15    Comment: Performed at [facility], [address] [place], [place] [address]  CBC     Status: Abnormal   Collection Time: 01/20/24  2:42 PM  Result Value Ref Range   WBC 5.9 4.0 - 10.5 K/uL   RBC 3.22 (L) 4.22 - 5.81 MIL/uL   Hemoglobin 10.4 (L) 13.0 - 17.0 g/dL   HCT 67.9 (L) 60.9 - 47.9 %   MCV 99.4 80.0 - 100.0 fL   MCH 32.3 26.0 - 34.0 pg   MCHC 32.5 30.0 - 36.0 g/dL   RDW 87.3 88.4 - 84.4 %   Platelets 189 150 - 400 K/uL   nRBC 0.0 0.0 - 0.2 %    Comment: Performed at [facility], [address], [place], [place] [address]       Assessment & Plan B12 deficiency due to diet B12 injection given in office today.  Return for next injection per provider instructions.  Chronic pain syndrome Patient is seen by Pain Management, who manage this condition.  He is well controlled with current therapy.   Will defer to them for further changes to plan of care.  Hyperlipidemia, mixed Continue current therapy for lipid control. Will modify as needed based on labwork results.   Essential (primary) hypertension Blood pressure well controlled with current medications.  Continue current therapy.  Will reassess at follow up.   Type 2 diabetes mellitus with stage 4 chronic kidney disease, with long-term current use of insulin  (HCC) Continue current diabetes POC, as patient has been well controlled on current regimen.  Will adjust meds if needed based on labs.   Chronic atrial fibrillation, unspecified (HCC) Patient is seen by cardiology, who manage this condition.  He is well controlled with current therapy.   Will defer  to them for further changes to plan of care.  [facility]) Patient is seen by hematology, who manage this condition.  He is well controlled with current therapy.   Will defer to them for further changes to plan of care.     Return in about 3 months (around 06/30/2024) for F/U.   Total time spent: 30 minutes  ALAN CHRISTELLA ARRANT, FNP  04/01/2024   This document may have been prepared by [facility] Voice Recognition software and as such may include unintentional dictation errors.     [1]  Allergies Allergen Reactions   Ivp Dye [Iodinated Contrast Media] Anaphylaxis and Other (See Comments)    Breathing difficulty and chest pain   Quinine Shortness Of Breath, Rash and Other (See Comments)    Other reaction(s): SHORTNESS OF BREATH   Amlodipine  Swelling    Other reaction(s): Edema of lower extremity   Aspirin Other (See Comments)  Stomach pain   Gabapentin     Other reaction(s): Dizziness   Glipizide Other (See Comments)    Other reaction(s): Chest pain   Hydrochlorothiazide Other (See Comments)    Other reaction(s): Impotence   Ibuprofen Other (See Comments)    Stomach pain  Other Reaction(s): ITCHING,WATERING EYES   Ioxaglate Other (See Comments)    Breathing difficulty and chest pain   Latex Itching    Gloves   Metformin And Related Diarrhea   Nsaids     Kidneys   Omeprazole  Other (See Comments)    Other reaction(s): FAILED THERAPY   Penicillins    Pregabalin Other (See Comments)    Other reaction(s): Dizziness   Doxycycline  Anxiety   Pioglitazone Diarrhea and Other (See Comments)    Other reaction(s): Diarrhea, Abdominal pain   Tape Rash    Some bandages and wraps have started causing rashes.  Unsure if it is the adhesive or the bandage material   "

## 2024-04-01 NOTE — Assessment & Plan Note (Signed)
 Patient is seen by hematology, who manage this condition.  He is well controlled with current therapy.   Will defer to them for further changes to plan of care.

## 2024-04-01 NOTE — Assessment & Plan Note (Signed)
 Continue current therapy for lipid control. Will modify as needed based on labwork results.

## 2024-04-01 NOTE — Assessment & Plan Note (Signed)
 Patient is seen by Pain Management, who manage this condition.  He is well controlled with current therapy.   Will defer to them for further changes to plan of care.

## 2024-04-04 ENCOUNTER — Other Ambulatory Visit: Payer: Self-pay | Admitting: Family

## 2024-04-04 MED ORDER — FREESTYLE LIBRE 3 PLUS SENSOR MISC: 11 refills | Status: AC

## 2024-04-04 MED ORDER — FREESTYLE LIBRE 3 READER DEVI: 1.0000 | Freq: Once | 0 refills | Status: AC

## 2024-04-08 ENCOUNTER — Ambulatory Visit: Admitting: Podiatry

## 2024-05-02 ENCOUNTER — Ambulatory Visit

## 2024-05-12 ENCOUNTER — Ambulatory Visit: Admitting: Family Medicine

## 2024-06-30 ENCOUNTER — Ambulatory Visit: Admitting: Family
